# Patient Record
Sex: Female | Born: 1980 | Race: White | Hispanic: No | Marital: Married | Smoking: Never smoker
Health system: Southern US, Community
[De-identification: ages and names within clinical notes are randomized; demographics above are authoritative.]

## PROBLEM LIST (undated history)

## (undated) DIAGNOSIS — Z8719 Personal history of other diseases of the digestive system: Secondary | ICD-10-CM

## (undated) DIAGNOSIS — Z8619 Personal history of other infectious and parasitic diseases: Secondary | ICD-10-CM

## (undated) HISTORY — DX: Personal history of other infectious and parasitic diseases: Z86.19

## (undated) HISTORY — DX: Personal history of other diseases of the digestive system: Z87.19

---

## 2005-01-25 ENCOUNTER — Ambulatory Visit: Payer: Self-pay | Admitting: Family Medicine

## 2005-01-25 DIAGNOSIS — G4733 Obstructive sleep apnea (adult) (pediatric): Secondary | ICD-10-CM | POA: Insufficient documentation

## 2005-01-25 HISTORY — PX: OTHER SURGICAL HISTORY: SHX169

## 2009-03-24 ENCOUNTER — Emergency Department: Payer: Self-pay | Admitting: Emergency Medicine

## 2009-10-06 ENCOUNTER — Emergency Department: Payer: Self-pay | Admitting: Emergency Medicine

## 2009-10-07 ENCOUNTER — Encounter: Admission: RE | Admit: 2009-10-07 | Discharge: 2009-10-07 | Payer: Self-pay | Admitting: Gastroenterology

## 2011-03-09 ENCOUNTER — Emergency Department (INDEPENDENT_AMBULATORY_CARE_PROVIDER_SITE_OTHER): Payer: 59

## 2011-03-09 ENCOUNTER — Emergency Department (HOSPITAL_BASED_OUTPATIENT_CLINIC_OR_DEPARTMENT_OTHER)
Admission: EM | Admit: 2011-03-09 | Discharge: 2011-03-10 | Disposition: A | Discharge status: Home / Self Care | Payer: 59 | Attending: Emergency Medicine | Admitting: Emergency Medicine

## 2011-03-09 DIAGNOSIS — R21 Rash and other nonspecific skin eruption: Secondary | ICD-10-CM | POA: Insufficient documentation

## 2011-03-09 DIAGNOSIS — R51 Headache: Secondary | ICD-10-CM | POA: Insufficient documentation

## 2011-03-09 LAB — URINALYSIS, ROUTINE W REFLEX MICROSCOPIC
Bilirubin Urine: NEGATIVE
Glucose, UA: NEGATIVE mg/dL
Hgb urine dipstick: NEGATIVE
Ketones, ur: NEGATIVE mg/dL
Nitrite: NEGATIVE
Protein, ur: NEGATIVE mg/dL
Specific Gravity, Urine: 1.017 (ref 1.005–1.030)
Urobilinogen, UA: 0.2 mg/dL (ref 0.0–1.0)
pH: 6.5 (ref 5.0–8.0)

## 2011-03-09 LAB — PREGNANCY, URINE

## 2013-05-24 LAB — BASIC METABOLIC PANEL
BUN: 15 mg/dL (ref 4–21)
Creatinine: 0.8 mg/dL (ref 0.5–1.1)
Glucose: 85 mg/dL
Potassium: 4.4 mmol/L (ref 3.4–5.3)
Sodium: 141 mmol/L (ref 137–147)

## 2013-05-24 LAB — TSH: TSH: 1.38 u[IU]/mL (ref 0.41–5.90)

## 2013-05-24 LAB — HEPATIC FUNCTION PANEL
ALT: 36 U/L — AB (ref 7–35)
AST: 29 U/L (ref 13–35)

## 2013-06-20 ENCOUNTER — Ambulatory Visit: Payer: Self-pay | Admitting: Family Medicine

## 2014-08-26 IMAGING — CR DG KNEE COMPLETE 4+V*L*
1 series · 4 of 4 positions shown · non-contrast
Comparison: none

REASON FOR EXAM: pain
COMMENTS:

PROCEDURE:     KDR - KDXR KNEE LT COMP WITH OBLIQUES  - June 20, 2013  [DATE]
RESULT:     Comparison:  None

[Series 1: ap · 0.17mm/px · 4 of 4 slices shown]
[im 1/4]
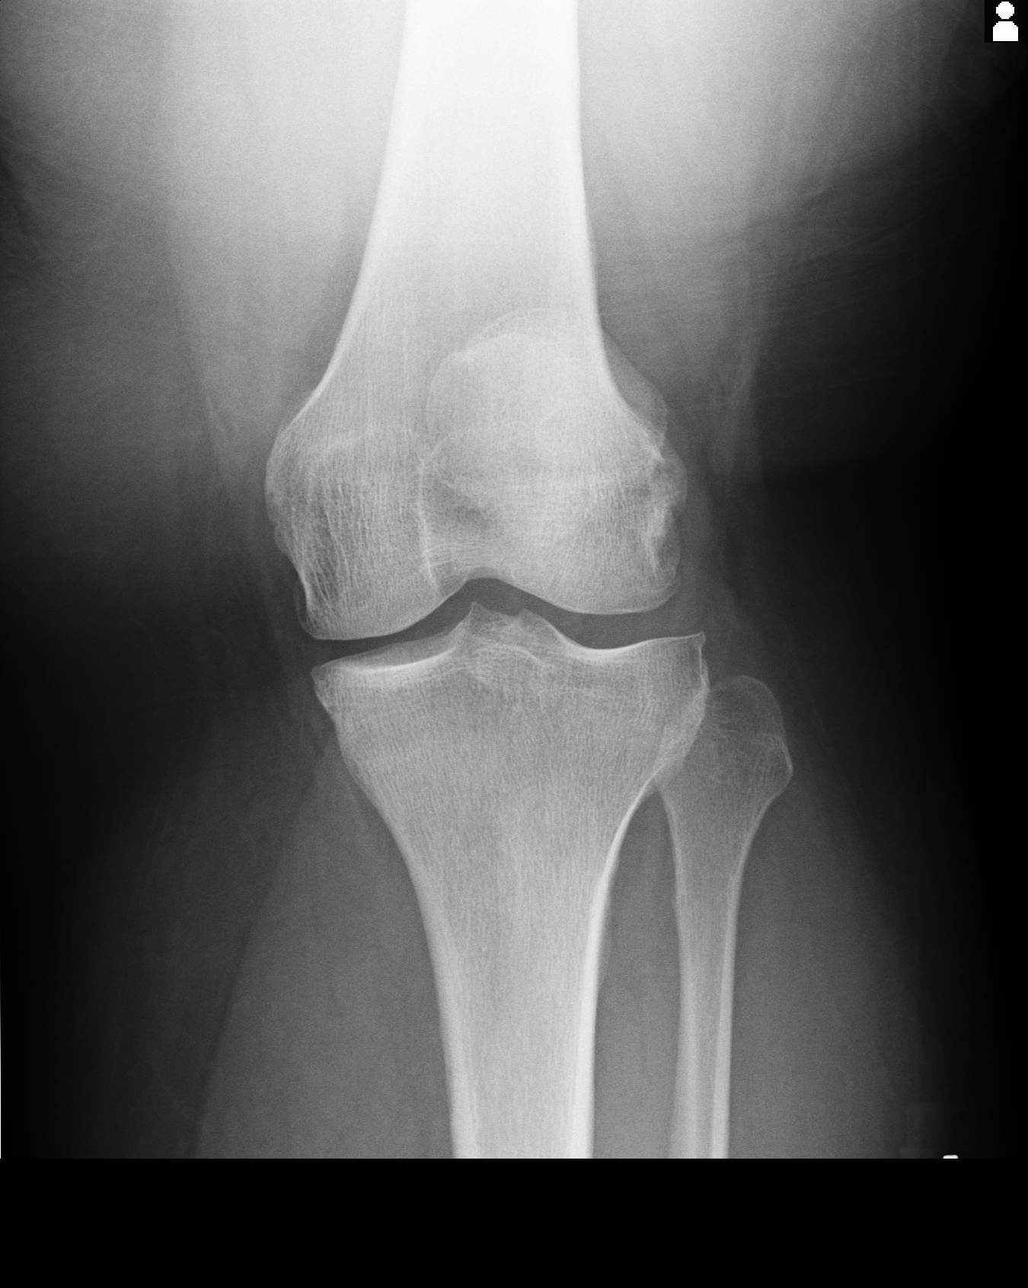
[im 2/4]
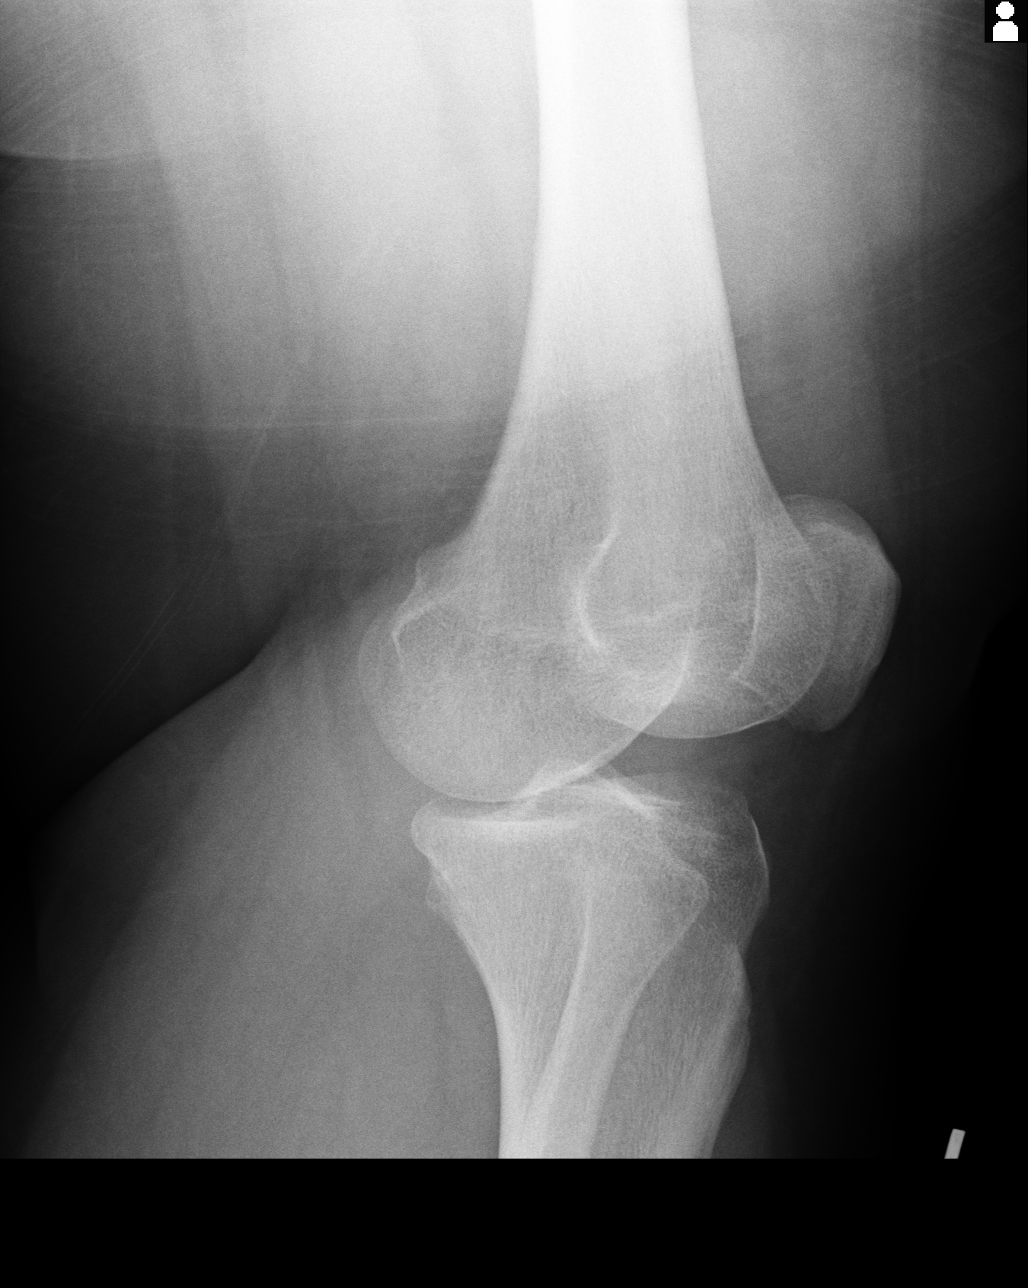
[im 3/4]
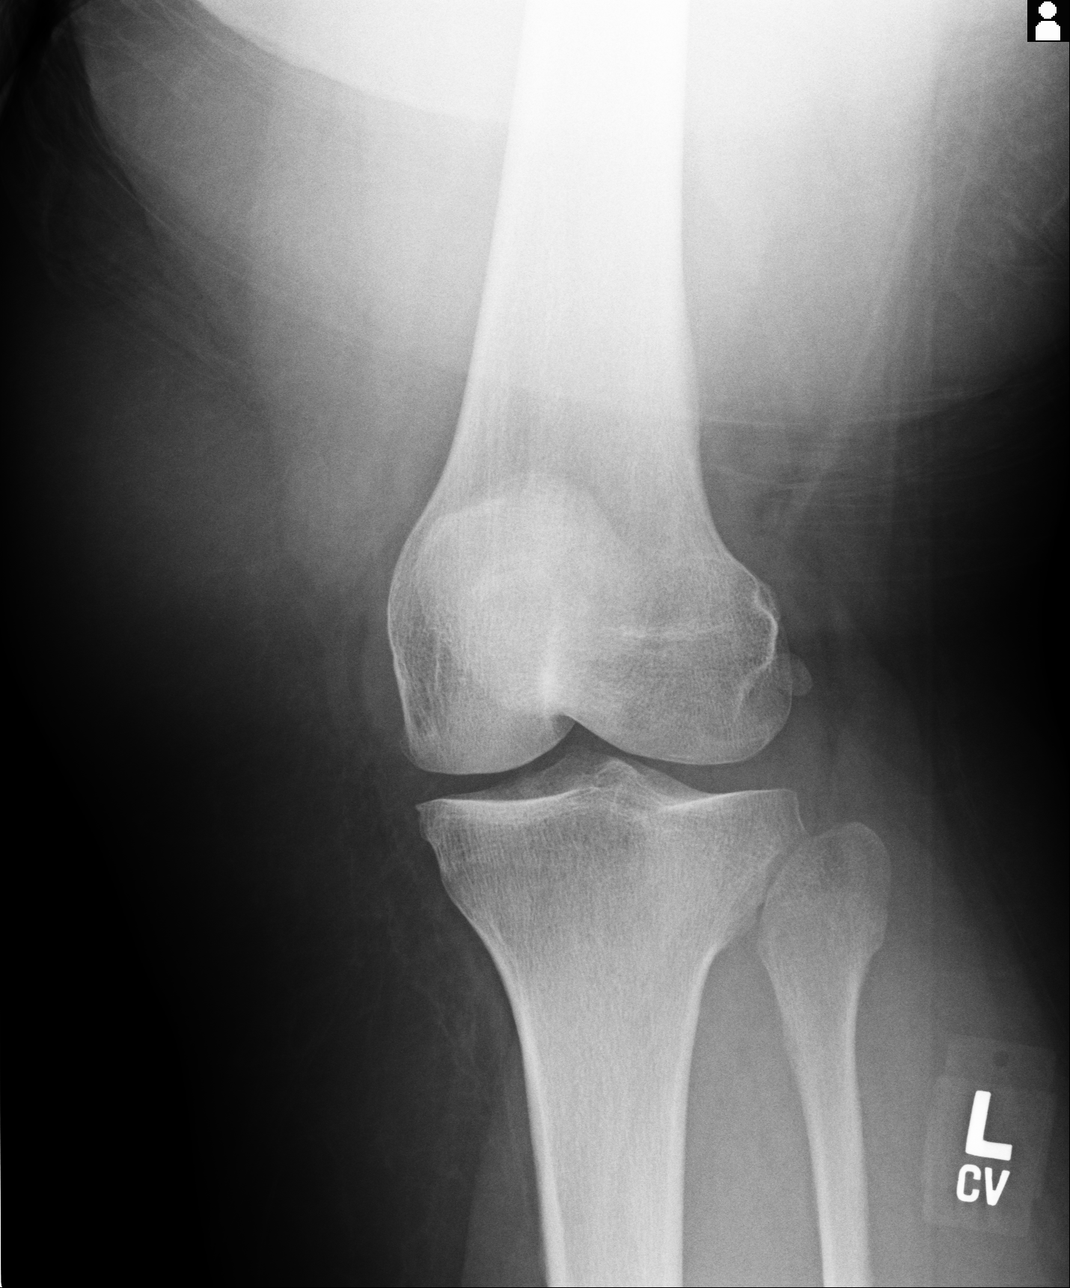
[im 4/4]
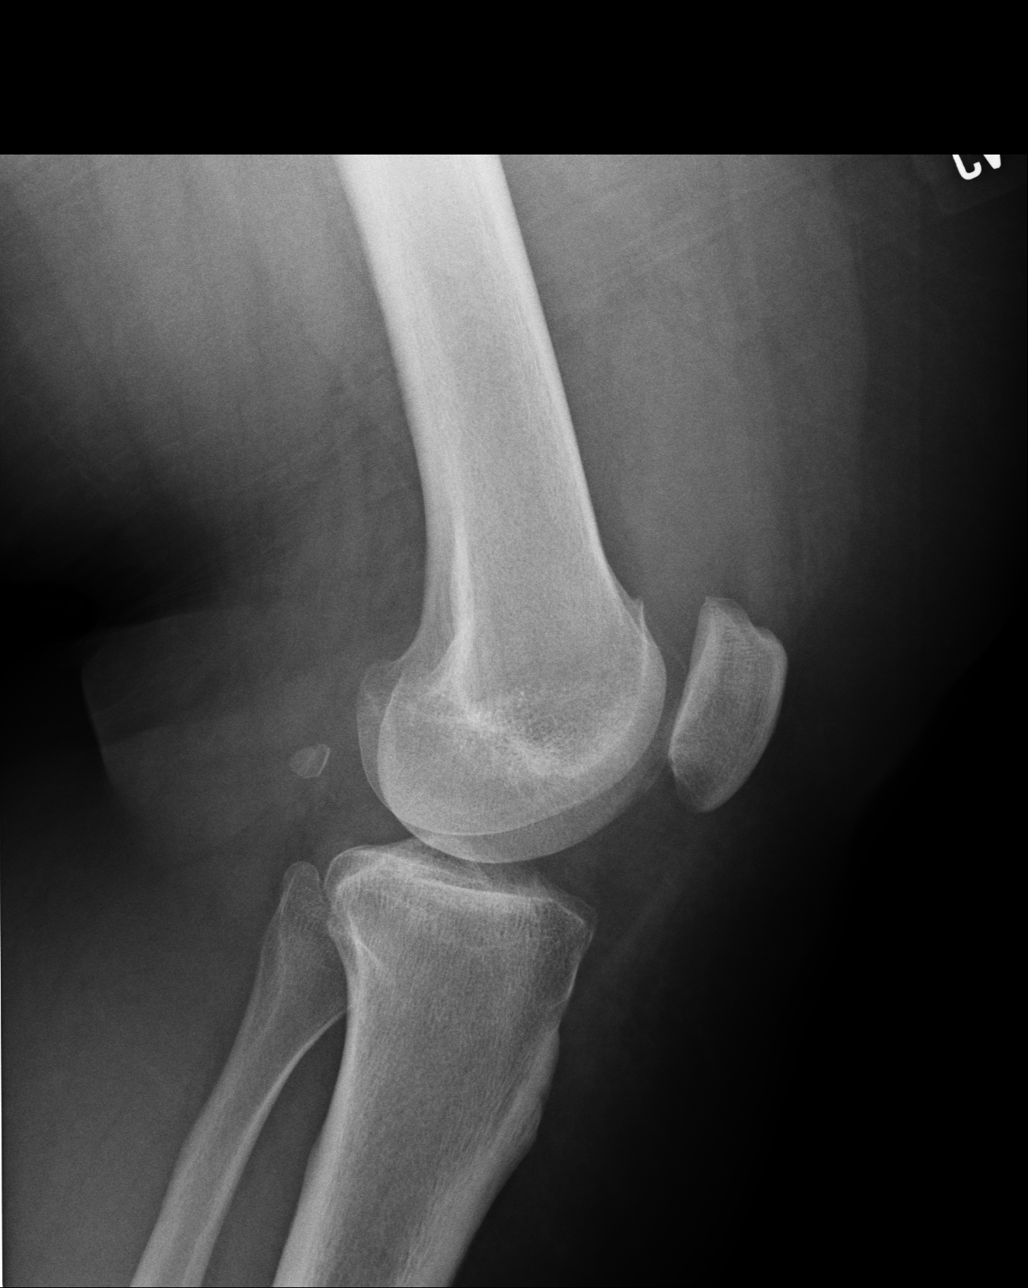

[4 of 4 positions shown; findings below may reference images not displayed]

FINDINGS: 4 views of the left knee demonstrates no acute fracture or dislocation.
There is a large joint effusion. There are mild degenerative changes of the
medial tibiofemoral compartment.
IMPRESSION: No acute osseous injury of the left knee.

[REDACTED]

## 2015-02-08 ENCOUNTER — Emergency Department
Admit: 2015-02-08 | Disposition: A | Discharge status: Home / Self Care | Payer: Self-pay | Admitting: Emergency Medicine

## 2015-02-08 LAB — COMPREHENSIVE METABOLIC PANEL
Albumin: 3.5 g/dL
Alkaline Phosphatase: 75 U/L
Anion Gap: 6 — ABNORMAL LOW (ref 7–16)
BUN: 14 mg/dL
Bilirubin,Total: 0.1 mg/dL — ABNORMAL LOW
Calcium, Total: 8.5 mg/dL — ABNORMAL LOW
Chloride: 107 mmol/L
Co2: 25 mmol/L
Creatinine: 0.9 mg/dL
EGFR (African American): >60
EGFR (Non-African Amer.): >60
Glucose: 153 mg/dL — ABNORMAL HIGH
Potassium: 3.6 mmol/L
SGOT(AST): 25 U/L
SGPT (ALT): 27 U/L
Sodium: 138 mmol/L
Total Protein: 7.5 g/dL

## 2015-02-08 LAB — CBC WITH DIFFERENTIAL/PLATELET
Basophil #: 0.1 10*3/uL (ref 0.0–0.1)
Basophil %: 0.7 %
Eosinophil #: 0.5 10*3/uL (ref 0.0–0.7)
Eosinophil %: 4.6 %
HCT: 36.6 % (ref 35.0–47.0)
HGB: 12 g/dL (ref 12.0–16.0)
Lymphocyte #: 1.3 10*3/uL (ref 1.0–3.6)
Lymphocyte %: 12.7 %
MCH: 26.3 pg (ref 26.0–34.0)
MCHC: 32.8 g/dL (ref 32.0–36.0)
MCV: 80 fL (ref 80–100)
Monocyte #: 0.7 x10 3/mm (ref 0.2–0.9)
Monocyte %: 7.1 %
Neutrophil #: 7.5 10*3/uL — ABNORMAL HIGH (ref 1.4–6.5)
Neutrophil %: 74.9 %
Platelet: 357 10*3/uL (ref 150–440)
RBC: 4.55 10*6/uL (ref 3.80–5.20)
RDW: 14.8 % — ABNORMAL HIGH (ref 11.5–14.5)
WBC: 10 10*3/uL (ref 3.6–11.0)

## 2015-02-08 LAB — LIPASE, BLOOD: Lipase: 24 U/L

## 2015-02-08 LAB — TROPONIN I: Troponin-I: <0.03 ng/mL

## 2015-02-09 LAB — URINALYSIS, COMPLETE
Bacteria: NONE SEEN
Bilirubin,UR: NEGATIVE
Blood: NEGATIVE
Glucose,UR: NEGATIVE mg/dL (ref 0–75)
Ketone: NEGATIVE
Leukocyte Esterase: NEGATIVE
Nitrite: NEGATIVE
Ph: 6 (ref 4.5–8.0)
Protein: NEGATIVE
Specific Gravity: 1.019 (ref 1.003–1.030)

## 2015-07-24 DIAGNOSIS — M25561 Pain in right knee: Secondary | ICD-10-CM | POA: Insufficient documentation

## 2015-07-24 DIAGNOSIS — G43909 Migraine, unspecified, not intractable, without status migrainosus: Secondary | ICD-10-CM | POA: Insufficient documentation

## 2015-07-24 DIAGNOSIS — Z8719 Personal history of other diseases of the digestive system: Secondary | ICD-10-CM | POA: Insufficient documentation

## 2015-07-24 DIAGNOSIS — M25562 Pain in left knee: Secondary | ICD-10-CM

## 2015-07-24 DIAGNOSIS — K219 Gastro-esophageal reflux disease without esophagitis: Secondary | ICD-10-CM | POA: Insufficient documentation

## 2015-07-24 DIAGNOSIS — J309 Allergic rhinitis, unspecified: Secondary | ICD-10-CM | POA: Insufficient documentation

## 2015-07-28 ENCOUNTER — Ambulatory Visit (INDEPENDENT_AMBULATORY_CARE_PROVIDER_SITE_OTHER): Payer: PRIVATE HEALTH INSURANCE | Admitting: Family Medicine

## 2015-07-28 ENCOUNTER — Encounter: Payer: Self-pay | Admitting: Family Medicine

## 2015-07-28 VITALS — BP 134/82 | HR 74 | Temp 98.3°F | Resp 16 | Ht 66.0 in | Wt >= 6400 oz

## 2015-07-28 DIAGNOSIS — S81812A Laceration without foreign body, left lower leg, initial encounter: Secondary | ICD-10-CM | POA: Diagnosis not present

## 2015-07-28 DIAGNOSIS — R601 Generalized edema: Secondary | ICD-10-CM | POA: Diagnosis not present

## 2015-07-28 NOTE — Progress Notes (Signed)
       Patient: Jessica Fields Female    DOB: [birth year]   34 y.o.   MRN: [id number] Visit Date: 07/28/2015  Today's Provider: Mila Merry, MD   Chief Complaint  Patient presents with  . Wound Check   Subjective:    HPI  Patient is here today to have staples removed. Patient states she fell 9 days ago  in a pool causing a laceration to her lower left leg. Patient was seen and treated at [facility] ER in [place]. Patient states they put staples in her leg and advised her to have them removed in 7-10 days. Patient reports the wound has healed well. Patient denies any drainage, redness at the site of the wound.  She states she was given tetanus vaccine at ER.  She is also concerned that both legs have  Been increasingly swollen the last few weeks, particularly the last week. She has actually been off of her feet more than usual, although not always elevating them.Denies any change in sodium or salt consumption. No DOE or orthopnea. No leg injuries or pains.     No Known Allergies Previous Medications   FLUTICASONE (FLONASE) 50 MCG/ACT NASAL SPRAY    Place 2 sprays into both nostrils daily as needed.   OXYCODONE-ACETAMINOPHEN (PERCOCET) 5-325 MG TABLET    Take 1 tablet by mouth every 8 (eight) hours as needed.     Review of Systems  Constitutional: Negative for fever, chills, appetite change and fatigue.  Respiratory: Negative for chest tightness and shortness of breath.   Cardiovascular: Negative for chest pain and palpitations.  Gastrointestinal: Negative for nausea, vomiting and abdominal pain.  Skin: Positive for wound.  Neurological: Negative for dizziness and weakness.    Social History  Substance Use Topics  . Smoking status: Never Smoker   . Smokeless tobacco: Not on file  . Alcohol Use: 0.0 oz/week    0 Standard drinks or equivalent per week     Comment: occasional use   Objective:   BP 134/82 mmHg  Pulse 74  Temp(Src) 98.3 F (36.8 C) (Oral)  Resp 16   Ht  (1.676 m)  Wt 438 lb (198.675 kg)  BMI 70.73 kg/m2  SpO2 98%  Physical Exam   General appearance: alert, well developed, well nourished, cooperative and in no distress Head: Normocephalic, without obvious abnormality, atraumatic Lungs: Respirations even and unlabored, CTAB Extremities: 3+ bilateral LE edema. No erythema.  Skin: Skin color, texture, turgor normal. No rashes seen  Psych: Appropriate mood and affect. Neurologic: Mental status: Alert, oriented to person, place, and time, thought content appropriate. Skin: Approximately 7cm stapled, scabbed laceration anterior left lower leg. Well approximated with no surrounding erythema.     Assessment & Plan:     1. Leg laceration, left, initial encounter Healing well. Removed all staples. No drainage and no sign of infection.   2. Generalized edema  - TSH - Comprehensive metabolic panel - CBC        Mila Merry, MD  [facility]

## 2015-07-29 ENCOUNTER — Telehealth: Payer: Self-pay

## 2015-07-29 LAB — COMPREHENSIVE METABOLIC PANEL
ALT: 27 IU/L (ref 0–32)
AST: 15 IU/L (ref 0–40)
Albumin/Globulin Ratio: 1.2 (ref 1.1–2.5)
Albumin: 3.7 g/dL (ref 3.5–5.5)
Alkaline Phosphatase: 91 IU/L (ref 39–117)
BUN/Creatinine Ratio: 15 (ref 8–20)
BUN: 10 mg/dL (ref 6–20)
Bilirubin Total: <0.2 mg/dL (ref 0.0–1.2)
CO2: 24 mmol/L (ref 18–29)
Calcium: 8.5 mg/dL — ABNORMAL LOW (ref 8.7–10.2)
Chloride: 102 mmol/L (ref 97–108)
Creatinine, Ser: 0.67 mg/dL (ref 0.57–1.00)
GFR calc Af Amer: 131 mL/min/{1.73_m2} (ref >59)
GFR calc non Af Amer: 113 mL/min/{1.73_m2} (ref >59)
Globulin, Total: 3 g/dL (ref 1.5–4.5)
Glucose: 92 mg/dL (ref 65–99)
Potassium: 4.2 mmol/L (ref 3.5–5.2)
Sodium: 141 mmol/L (ref 134–144)
Total Protein: 6.7 g/dL (ref 6.0–8.5)

## 2015-07-29 LAB — CBC
Hematocrit: 36.5 % (ref 34.0–46.6)
Hemoglobin: 11.7 g/dL (ref 11.1–15.9)
MCH: 26.3 pg — ABNORMAL LOW (ref 26.6–33.0)
MCHC: 32.1 g/dL (ref 31.5–35.7)
MCV: 82 fL (ref 79–97)
Platelets: 353 10*3/uL (ref 150–379)
RBC: 4.45 x10E6/uL (ref 3.77–5.28)
RDW: 14.7 % (ref 12.3–15.4)
WBC: 10.1 10*3/uL (ref 3.4–10.8)

## 2015-07-29 LAB — TSH: TSH: 2.61 u[IU]/mL (ref 0.450–4.500)

## 2015-07-29 NOTE — Telephone Encounter (Signed)
-----   Message from Malva Limes, MD sent at 07/29/2015  8:00 AM EDT ----- Labs are all normal. Swelling may be related to leg injury. Try to walk more, avoid sodium in diet, and keep legs elevated when not walking.

## 2015-07-29 NOTE — Telephone Encounter (Signed)
Advised patient as below.  

## 2015-07-29 NOTE — Telephone Encounter (Signed)
Left message to call back  

## 2015-07-30 ENCOUNTER — Encounter: Payer: Self-pay | Admitting: Family Medicine

## 2015-08-11 ENCOUNTER — Telehealth: Payer: Self-pay | Admitting: Family Medicine

## 2015-08-11 MED ORDER — CEPHALEXIN 500 MG PO CAPS: 500.0000 mg | ORAL_CAPSULE | Freq: Four times a day (QID) | ORAL | Status: DC

## 2015-08-11 NOTE — Telephone Encounter (Signed)
Patient advised.

## 2015-08-11 NOTE — Telephone Encounter (Signed)
Pt states Dr Sherrie MustacheFisher removes staples from her leg 2 weeks ago and she is still having brown/orange and blood discharge from her leg.  Pt is asking if this is normal?  CB#[phone]/MW

## 2015-08-11 NOTE — Telephone Encounter (Signed)
Called patient back for more info. Patient stated that there has been no fever and no heat at the wound area. There is a rash where the bandage had been. Patient said that there is a little discoloration right at edges of wound. Still having brown/ orange and blood discharge. Please advise.

## 2015-08-12 NOTE — Telephone Encounter (Signed)
Was advised by CMA to apply neosporin twice a day and sent in rx for cephalexin 500mg  QID. She is to call if not steadily improving and if not resolved before finishing antibiotic.

## 2016-03-17 ENCOUNTER — Ambulatory Visit (INDEPENDENT_AMBULATORY_CARE_PROVIDER_SITE_OTHER): Payer: No Typology Code available for payment source | Admitting: Family Medicine

## 2016-03-17 ENCOUNTER — Encounter: Payer: Self-pay | Admitting: Family Medicine

## 2016-03-17 VITALS — BP 152/90 | HR 111 | Temp 98.8°F | Resp 20 | Wt >= 6400 oz

## 2016-03-17 DIAGNOSIS — J069 Acute upper respiratory infection, unspecified: Secondary | ICD-10-CM

## 2016-03-17 DIAGNOSIS — R059 Cough, unspecified: Secondary | ICD-10-CM

## 2016-03-17 DIAGNOSIS — R05 Cough: Secondary | ICD-10-CM | POA: Diagnosis not present

## 2016-03-17 MED ORDER — HYDROCODONE-HOMATROPINE 5-1.5 MG/5ML PO SYRP: 5.0000 mL | ORAL_SOLUTION | Freq: Three times a day (TID) | ORAL | Status: DC | PRN

## 2016-03-17 NOTE — Patient Instructions (Signed)
Upper Respiratory Infection, Adult Most upper respiratory infections (URIs) are a viral infection of the air passages leading to the lungs. A URI affects the nose, throat, and upper air passages. The most common type of URI is nasopharyngitis and is typically referred to as "the common cold." URIs run their course and usually go away on their own. Most of the time, a URI does not require medical attention, but sometimes a bacterial infection in the upper airways can follow a viral infection. This is called a secondary infection. Sinus and middle ear infections are common types of secondary upper respiratory infections. Bacterial pneumonia can also complicate a URI. A URI can worsen asthma and chronic obstructive pulmonary disease (COPD). Sometimes, these complications can require emergency medical care and may be life threatening.  CAUSES Almost all URIs are caused by viruses. A virus is a type of germ and can spread from one person to another.  RISKS FACTORS You may be at risk for a URI if:   You smoke.   You have chronic heart or lung disease.  You have a weakened defense (immune) system.   You are very young or very old.   You have nasal allergies or asthma.  You work in crowded or poorly ventilated areas.  You work in health care facilities or schools. SIGNS AND SYMPTOMS  Symptoms typically develop 2-3 days after you come in contact with a cold virus. Most viral URIs last 7-10 days. However, viral URIs from the influenza virus (flu virus) can last 14-18 days and are typically more severe. Symptoms may include:   Runny or stuffy (congested) nose.   Sneezing.   Cough.   Sore throat.   Headache.   Fatigue.   Fever.   Loss of appetite.   Pain in your forehead, behind your eyes, and over your cheekbones (sinus pain).  Muscle aches.  DIAGNOSIS  Your health care provider may diagnose a URI by:  Physical exam.  Tests to check that your symptoms are not due to  another condition such as:  Strep throat.  Sinusitis.  Pneumonia.  Asthma. TREATMENT  A URI goes away on its own with time. It cannot be cured with medicines, but medicines may be prescribed or recommended to relieve symptoms. Medicines may help:  Reduce your fever.  Reduce your cough.  Relieve nasal congestion. HOME CARE INSTRUCTIONS   Take medicines only as directed by your health care provider.   Gargle warm saltwater or take cough drops to comfort your throat as directed by your health care provider.  Use a warm mist humidifier or inhale steam from a shower to increase air moisture. This may make it easier to breathe.  Drink enough fluid to keep your urine clear or pale yellow.   Eat soups and other clear broths and maintain good nutrition.   Rest as needed.   Return to work when your temperature has returned to normal or as your health care provider advises. You may need to stay home longer to avoid infecting others. You can also use a face mask and careful hand washing to prevent spread of the virus.  Increase the usage of your inhaler if you have asthma.   Do not use any tobacco products, including cigarettes, chewing tobacco, or electronic cigarettes. If you need help quitting, ask your health care provider. PREVENTION  The best way to protect yourself from getting a cold is to practice good hygiene.   Avoid oral or hand contact with people with cold   symptoms.   Wash your hands often if contact occurs.  There is no clear evidence that vitamin C, vitamin E, echinacea, or exercise reduces the chance of developing a cold. However, it is always recommended to get plenty of rest, exercise, and practice good nutrition.  SEEK MEDICAL CARE IF:   You are getting worse rather than better.   Your symptoms are not controlled by medicine.   You have chills.  You have worsening shortness of breath.  You have brown or red mucus.  You have yellow or brown nasal  discharge.  You have pain in your face, especially when you bend forward.  You have a fever.  You have swollen neck glands.  You have pain while swallowing.  You have white areas in the back of your throat. SEEK IMMEDIATE MEDICAL CARE IF:   You have severe or persistent:  Headache.  Ear pain.  Sinus pain.  Chest pain.  You have chronic lung disease and any of the following:  Wheezing.  Prolonged cough.  Coughing up blood.  A change in your usual mucus.  You have a stiff neck.  You have changes in your:  Vision.  Hearing.  Thinking.  Mood. MAKE SURE YOU:   Understand these instructions.  Will watch your condition.  Will get help right away if you are not doing well or get worse.   This information is not intended to replace advice given to you by your health care provider. Make sure you discuss any questions you have with your health care provider.   Document Released: 03/30/2001 Document Revised: 02/18/2015 Document Reviewed: 01/09/2014 Elsevier Interactive Patient Education 2016 [employer] Inc.  

## 2016-03-17 NOTE — Progress Notes (Signed)
Patient: Jessica Fields Female    DOB: 03/14/1981   35 y.o.   MRN: [id number] Visit Date: 03/17/2016  Today's Provider: Mila Merryonald Fisher, MD   Chief Complaint  Patient presents with  . Cough   Subjective:    Cough This is a new problem. The current episode started yesterday. The problem has been gradually worsening. Associated symptoms include chest pain (when coughing), chills, a fever (low grade 99.8), headaches, myalgias, nasal congestion, postnasal drip, rhinorrhea, a sore throat, sweats (night sweats) and wheezing. Pertinent negatives include no ear congestion, ear pain, eye redness, heartburn, hemoptysis, rash or shortness of breath. Nothing aggravates the symptoms. Treatments tried: Delsym and NyQuil. The treatment provided mild relief.       No Known Allergies Previous Medications   FLUTICASONE (FLONASE) 50 MCG/ACT NASAL SPRAY    Place 2 sprays into both nostrils daily as needed.    Review of Systems  Constitutional: Positive for fever (low grade 99.8), chills, diaphoresis (night sweats) and fatigue. Negative for appetite change.  HENT: Positive for congestion, postnasal drip, rhinorrhea, sneezing, sore throat, trouble swallowing and voice change. Negative for ear discharge, ear pain, mouth sores, nosebleeds, sinus pressure and tinnitus.   Eyes: Positive for discharge (watery eyes). Negative for pain, redness and itching.  Respiratory: Positive for cough and wheezing. Negative for hemoptysis, chest tightness and shortness of breath.   Cardiovascular: Positive for chest pain (when coughing). Negative for palpitations.  Gastrointestinal: Negative for heartburn, nausea, vomiting and abdominal pain.  Musculoskeletal: Positive for myalgias.  Skin: Negative for rash.  Neurological: Positive for headaches. Negative for dizziness, weakness and light-headedness.    Social History  Substance Use Topics  . Smoking status: Never Smoker   . Smokeless tobacco: Not on file  .  Alcohol Use: 0.0 oz/week    0 Standard drinks or equivalent per week     Comment: occasional use   Objective:   BP 152/90 mmHg  Pulse 111  Temp(Src) 98.8 F (37.1 C) (Oral)  Resp 20  Wt 431 lb (195.5 kg)  SpO2 97%  Physical Exam  General Appearance:    Alert, cooperative, no distress  HENT:   ENT exam normal, no neck nodes or sinus tenderness, bilateral TM normal without fluid or infection, neck without nodes, pharynx erythematous without exudate, sinuses nontender and nasal mucosa pale and congested  Eyes:    PERRL, conjunctiva/corneas clear, EOM's intact       Lungs:     Clear to auscultation bilaterally, respirations unlabored  Heart:    Regular rate and rhythm  Neurologic:   Awake, alert, oriented x 3. No apparent focal neurological           defect.            Assessment & Plan:     1. Upper respiratory infection Counseled regarding signs and symptoms of viral and bacterial respiratory infections. Advised to call or return for additional evaluation if he develops any sign of bacterial infection, or if current symptoms last longer than 10 days.    2. Cough  - HYDROcodone-homatropine (HYCODAN) 5-1.5 MG/5ML syrup; Take 5 mLs by mouth every 8 (eight) hours as needed for cough.  Dispense: 120 mL; Refill: 0     The entirety of the information documented in the History of Present Illness, Review of Systems and Physical Exam were personally obtained by me. Portions of this information were initially documented by Awilda Billoshena Dorotha Hirschi, CMA and reviewed by me for thoroughness  and accuracy.    Lelon Huh, MD  [facility]

## 2016-04-16 IMAGING — CR DG ABDOMEN 3V
1 series · 4 of 4 positions shown · non-contrast
Comparison: None.

CLINICAL DATA: Upper abdominal pain beginning yesterday.

EXAM:
ABDOMEN SERIES

[Series 1: dxr abdomen 3-way (incl pa cxr) · 0.14mm/px · 4 of 4 slices shown]
[im 1/4]
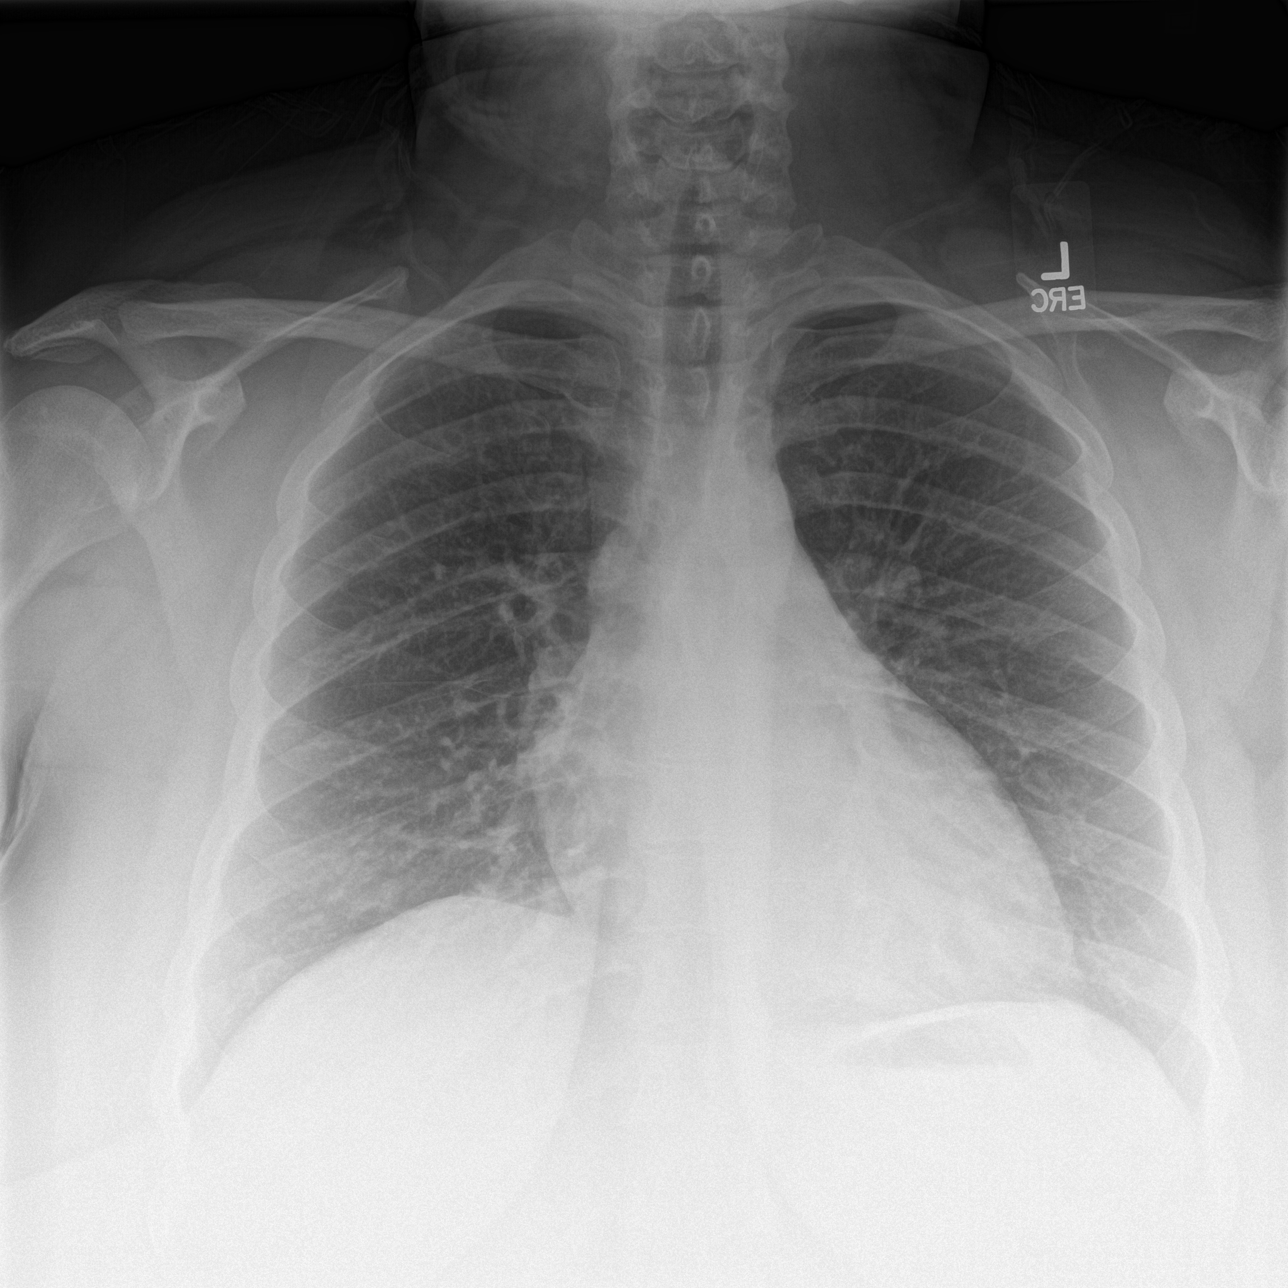
[im 2/4]
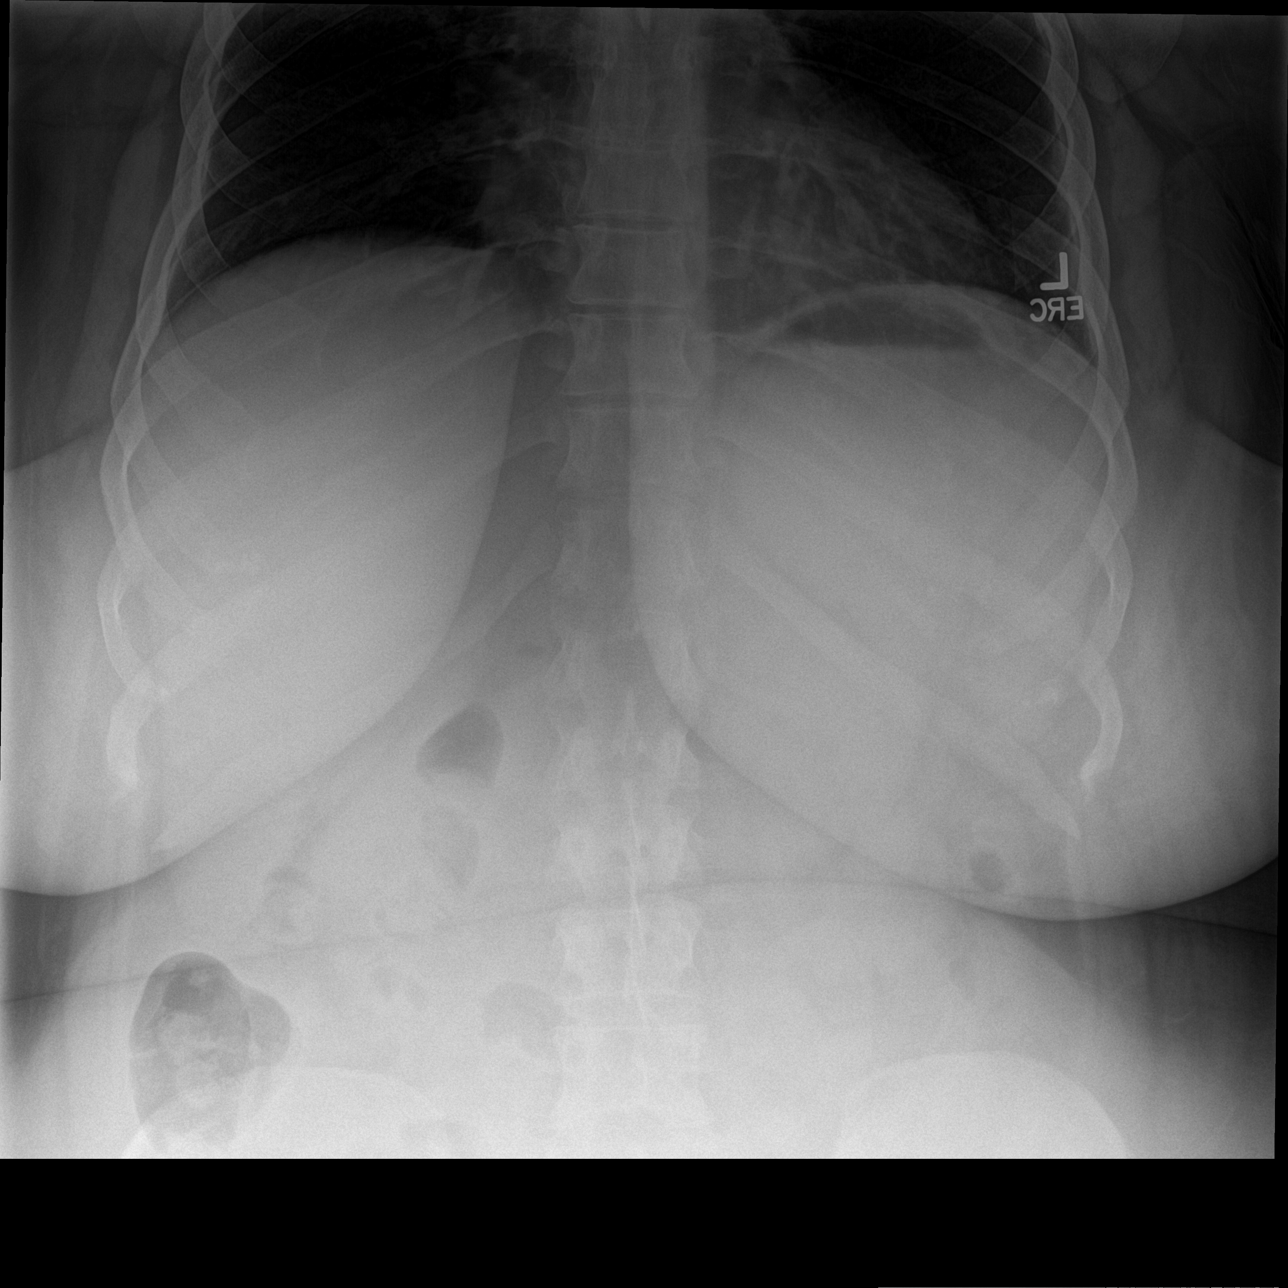
[im 3/4]
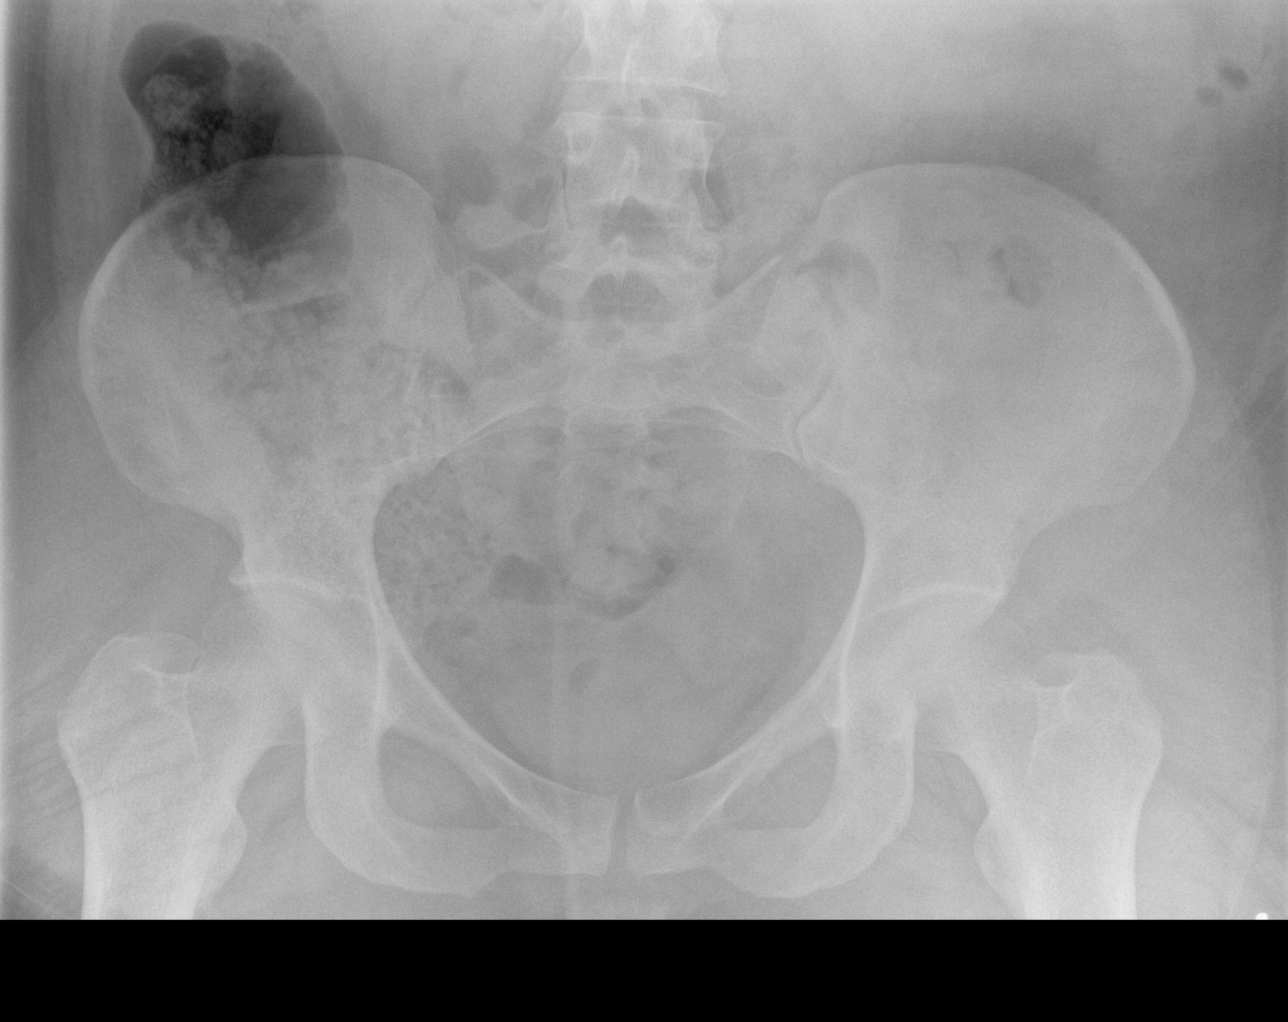
[im 4/4]
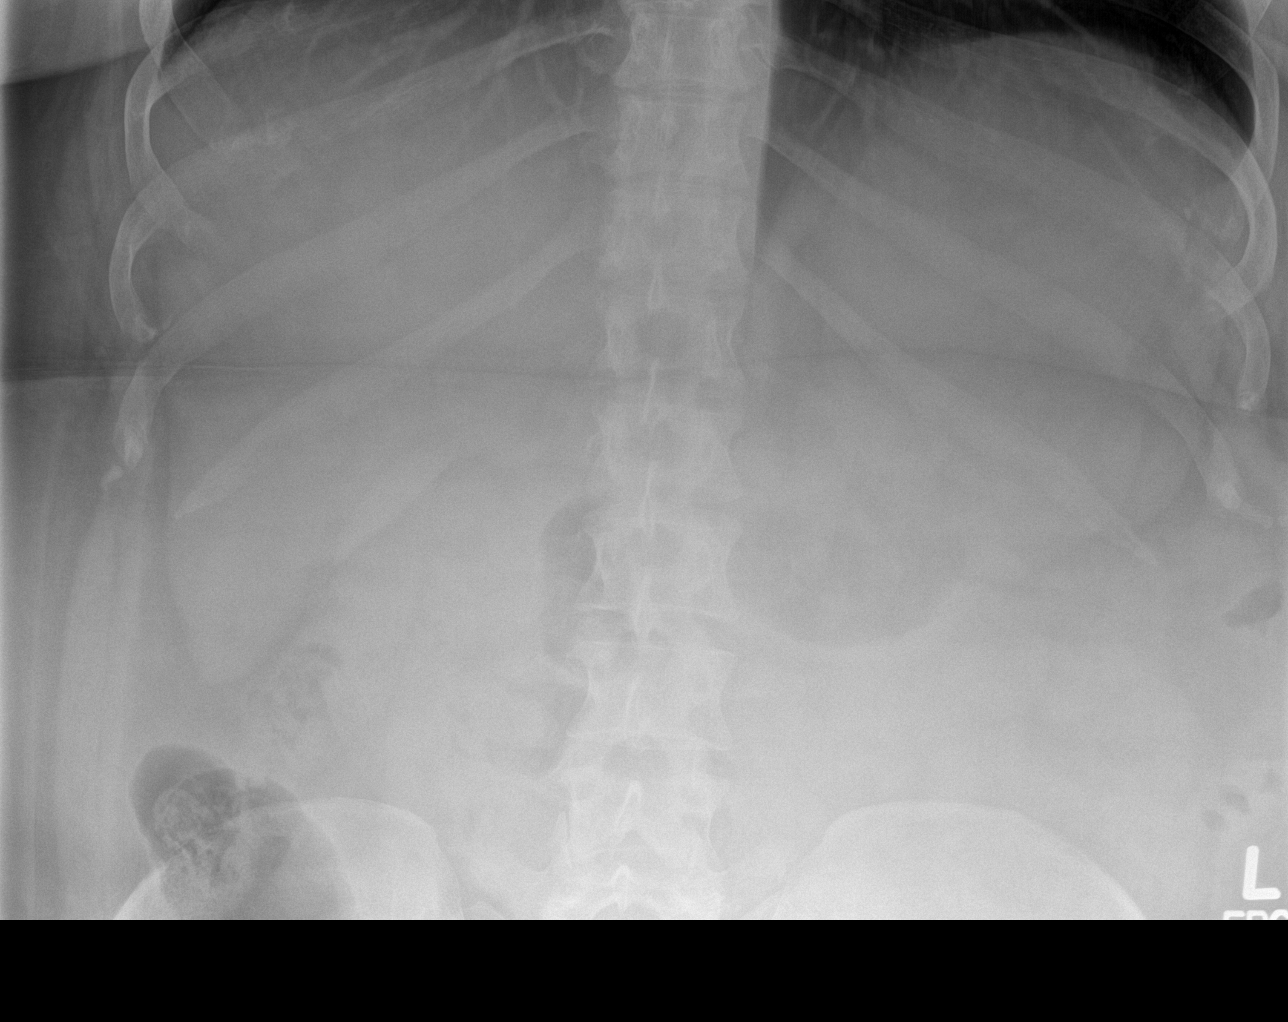

[4 of 4 positions shown; findings below may reference images not displayed]

FINDINGS: There is no evidence of dilated bowel loops or free intraperitoneal
air. No radiopaque calculi or other significant radiographic
abnormality is seen. Heart size and mediastinal contours are within
normal limits. Both lungs are clear.
IMPRESSION: Negative abdominal radiographs.  No acute cardiopulmonary disease.

## 2016-04-16 IMAGING — US ABDOMEN ULTRASOUND
1 series · 14 of 25 positions shown · non-contrast
Comparison: None.

CLINICAL DATA: Severe epigastric pain. Patient too big for CT
scanner. Weight exceeds 400 lb.

EXAM:
ULTRASOUND ABDOMEN COMPLETE

[Series 1: abdomen ultrasound · 0.33mm/px · 14 of 128 slices shown]
[im 1/128]
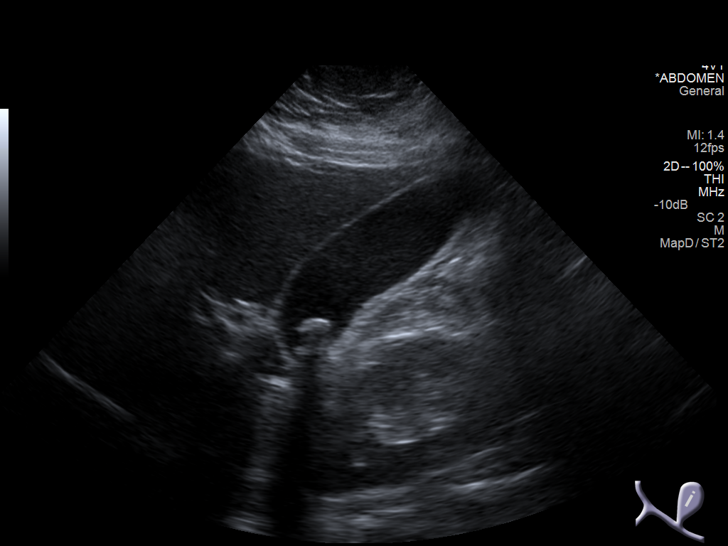
[im 11/128]
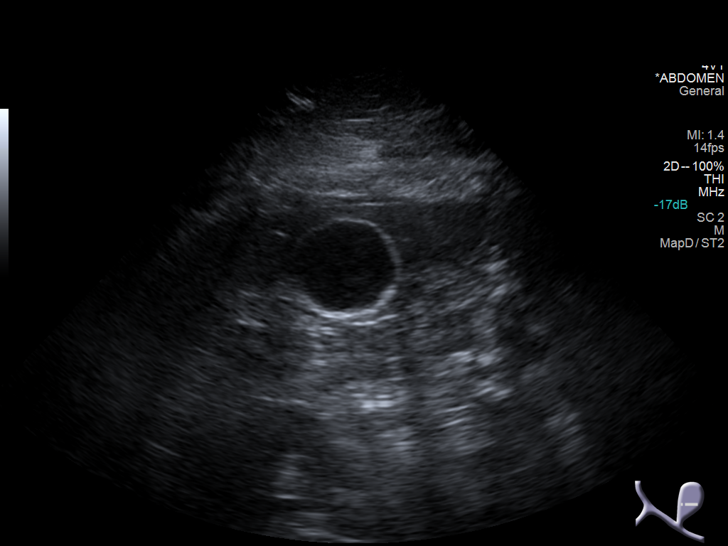
[im 22/128]
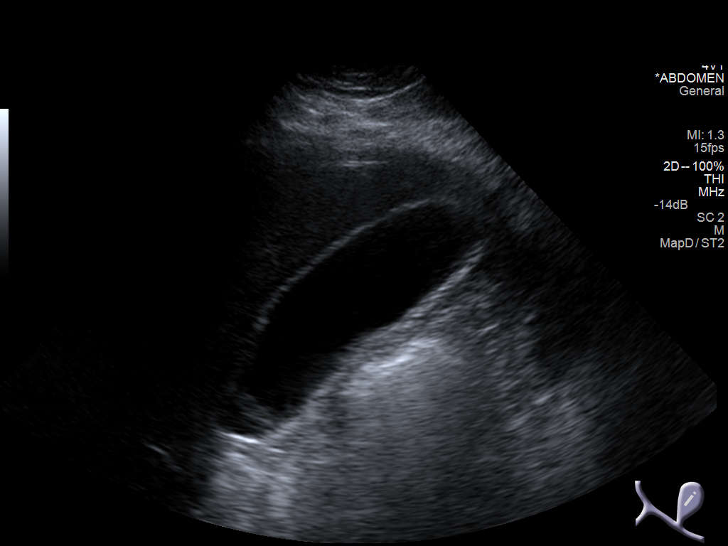
[im 32/128]
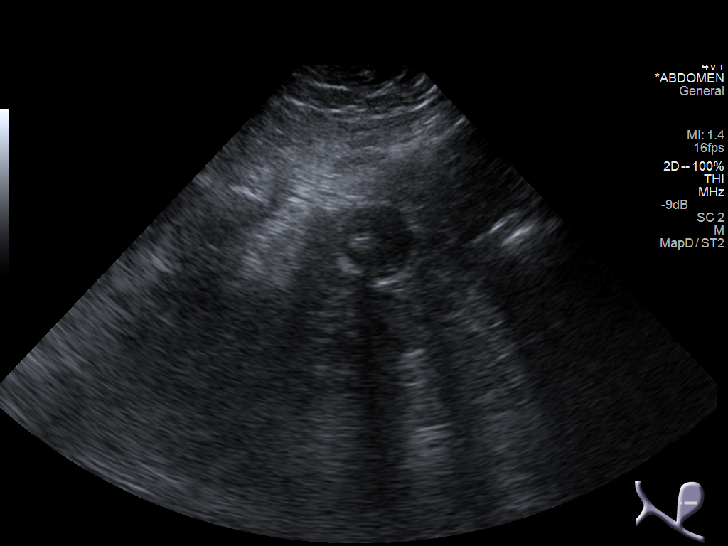
[im 43/128]
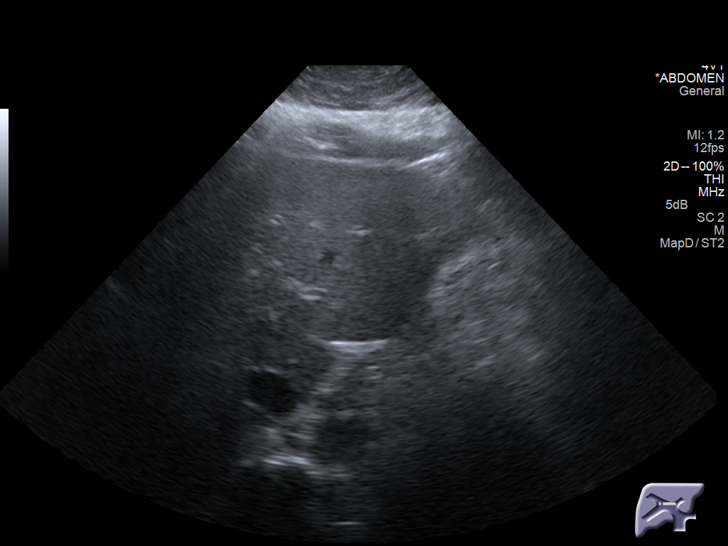
[im 48/128]
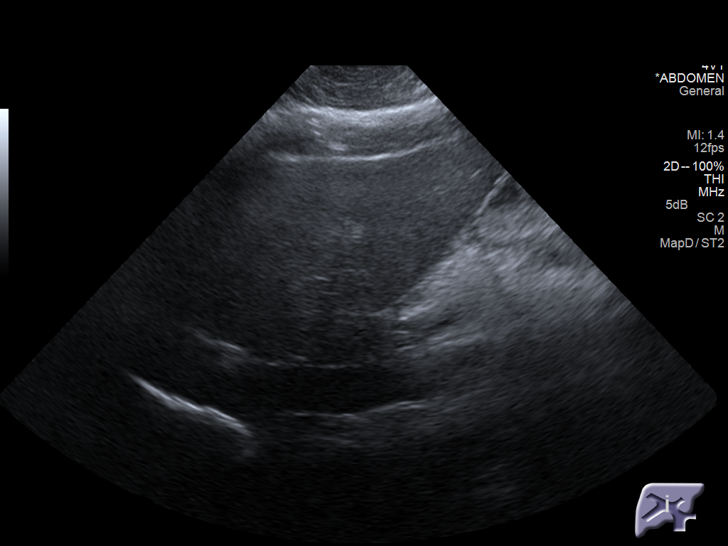
[im 59/128]
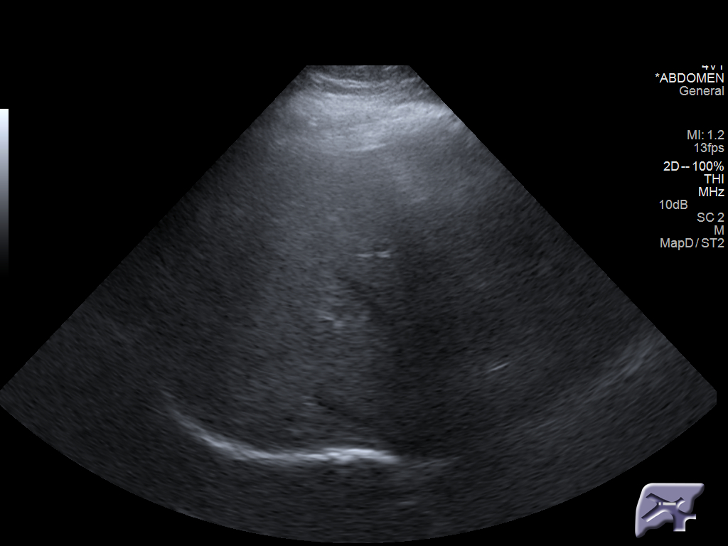
[im 69/128]
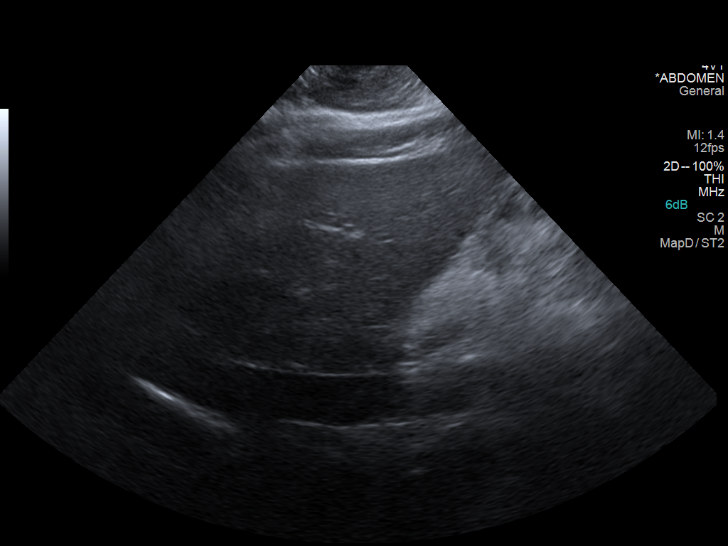
[im 80/128]
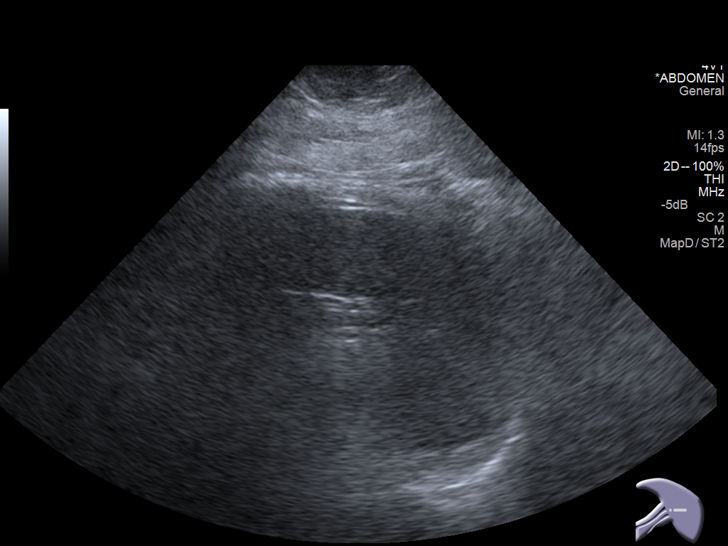
[im 85/128]
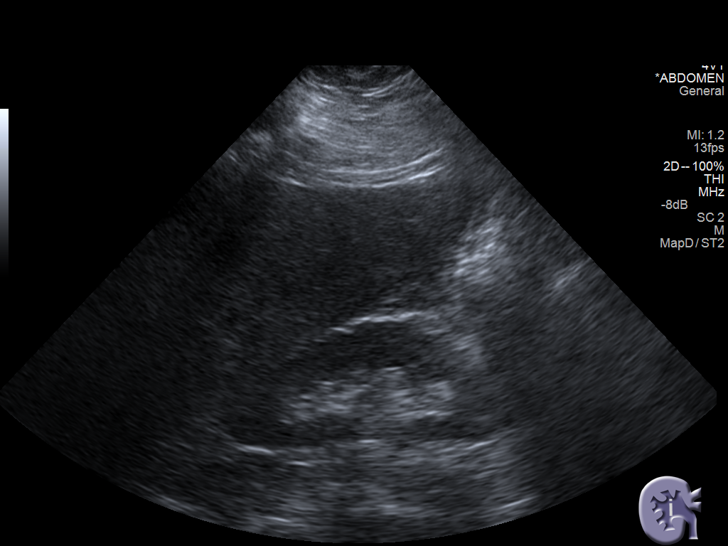
[im 96/128]
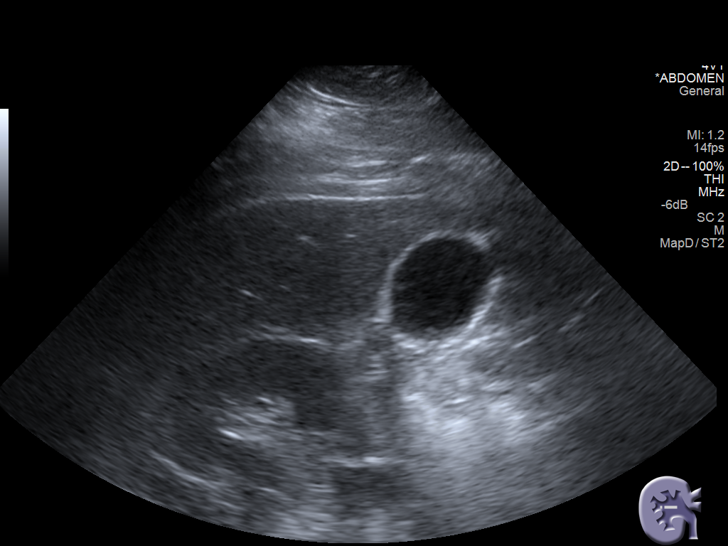
[im 106/128]
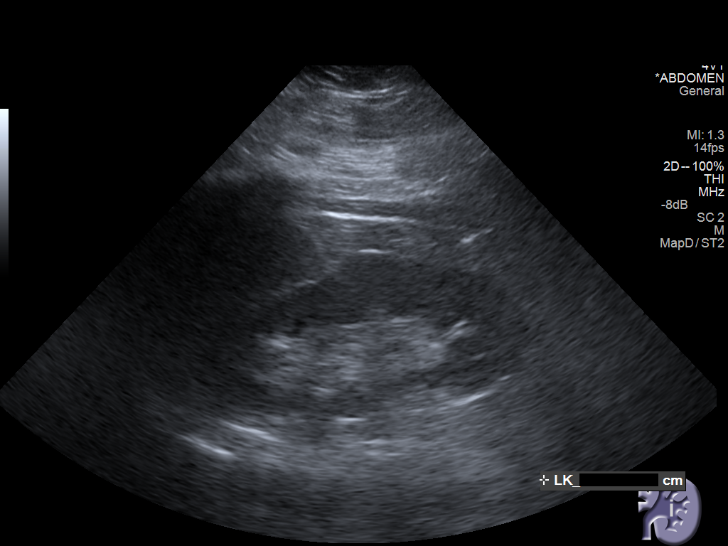
[im 117/128]
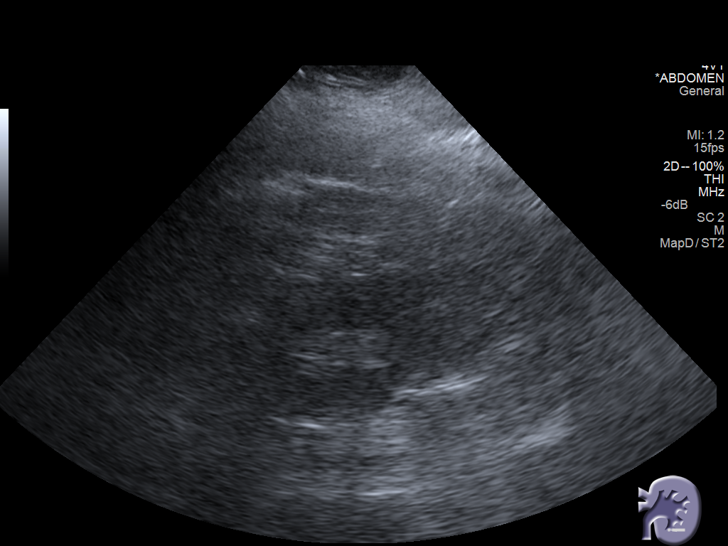
[im 128/128]
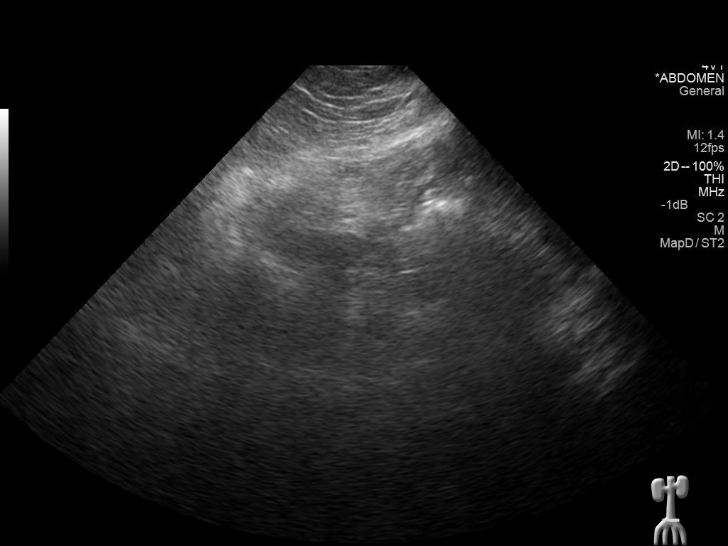

[14 of 25 positions shown; findings below may reference images not displayed]

FINDINGS: Gallbladder: Multiple gallstones largest 1.7 cm. Negative
sonographic Murphy's sign, but the patient was medicated.

Common bile duct: Diameter: Normal measuring 5.1 mm.

Liver: No focal lesion identified.  Within the steatosis.

IVC: No abnormality visualized.

Pancreas: Visualized portion unremarkable.

Spleen: Size and appearance within normal limits.

Right Kidney: Length: 9.8 cm. Echogenicity within normal limits. No
mass or hydronephrosis visualized.

Left Kidney: Length: 12.3 cm. Echogenicity within normal limits. No
mass or hydronephrosis visualized.

Abdominal aorta: No aneurysm visualized.

Other findings: None.
IMPRESSION: Cholelithiasis without definite signs of acute cholecystitis. See
discussion above.

## 2017-07-06 ENCOUNTER — Ambulatory Visit (INDEPENDENT_AMBULATORY_CARE_PROVIDER_SITE_OTHER): Payer: 59 | Admitting: Family Medicine

## 2017-07-06 ENCOUNTER — Encounter: Payer: Self-pay | Admitting: Family Medicine

## 2017-07-06 VITALS — BP 120/70 | HR 130 | Temp 98.7°F | Resp 16 | Wt >= 6400 oz

## 2017-07-06 DIAGNOSIS — Z8719 Personal history of other diseases of the digestive system: Secondary | ICD-10-CM | POA: Diagnosis not present

## 2017-07-06 DIAGNOSIS — R1011 Right upper quadrant pain: Secondary | ICD-10-CM

## 2017-07-06 LAB — COMPLETE METABOLIC PANEL WITH GFR
AG Ratio: 1.1 (calc) (ref 1.0–2.5)
ALT: 27 U/L (ref 6–29)
AST: 16 U/L (ref 10–30)
Albumin: 3.8 g/dL (ref 3.6–5.1)
Alkaline phosphatase (APISO): 82 U/L (ref 33–115)
BUN: 15 mg/dL (ref 7–25)
CO2: 25 mmol/L (ref 20–32)
Calcium: 8.9 mg/dL (ref 8.6–10.2)
Chloride: 102 mmol/L (ref 98–110)
Creat: 0.96 mg/dL (ref 0.50–1.10)
GFR, Est African American: 87 mL/min/{1.73_m2} (ref >=60)
GFR, Est Non African American: 75 mL/min/{1.73_m2} (ref >=60)
Globulin: 3.6 g/dL (calc) (ref 1.9–3.7)
Glucose, Bld: 111 mg/dL — ABNORMAL HIGH (ref 65–99)
Potassium: 4.1 mmol/L (ref 3.5–5.3)
Sodium: 138 mmol/L (ref 135–146)
Total Bilirubin: 0.7 mg/dL (ref 0.2–1.2)
Total Protein: 7.4 g/dL (ref 6.1–8.1)

## 2017-07-06 LAB — CBC
HCT: 38.6 % (ref 35.0–45.0)
Hemoglobin: 12.9 g/dL (ref 11.7–15.5)
MCH: 27.4 pg (ref 27.0–33.0)
MCHC: 33.4 g/dL (ref 32.0–36.0)
MCV: 82 fL (ref 80.0–100.0)
MPV: 11.1 fL (ref 7.5–12.5)
Platelets: 312 10*3/uL (ref 140–400)
RBC: 4.71 10*6/uL (ref 3.80–5.10)
RDW: 13.6 % (ref 11.0–15.0)
WBC: 20.5 10*3/uL — ABNORMAL HIGH (ref 3.8–10.8)

## 2017-07-06 MED ORDER — OXYCODONE-ACETAMINOPHEN 7.5-325 MG PO TABS: 1.0000 | ORAL_TABLET | ORAL | 0 refills | Status: DC | PRN

## 2017-07-06 MED ORDER — METRONIDAZOLE 500 MG PO TABS: 500.0000 mg | ORAL_TABLET | Freq: Three times a day (TID) | ORAL | 0 refills | Status: DC

## 2017-07-06 MED ORDER — CIPROFLOXACIN HCL 500 MG PO TABS: 500.0000 mg | ORAL_TABLET | Freq: Two times a day (BID) | ORAL | 0 refills | Status: AC

## 2017-07-06 MED ORDER — PROMETHAZINE HCL 25 MG PO TABS: 25.0000 mg | ORAL_TABLET | Freq: Three times a day (TID) | ORAL | 0 refills | Status: DC | PRN

## 2017-07-06 NOTE — Progress Notes (Signed)
Patient: Jessica Fields Female    DOB: March 27, 1981   36 y.o.   MRN: [id number] Visit Date: 07/06/2017  Today's Provider: Mila Merry, MD   Chief Complaint  Patient presents with  . Abdominal Pain   Subjective:    Patient stated that she had a severe gallbladder attack 3 days ago. Pain last till the next morning. Patient stated that now pain occurs with movement. Patient has been running a fever 101.2. Also she has had chills, sweats, clammy, nausea and vomiting. Patient has been taking otc Aleve with mild relief.    Abdominal Pain  This is a new problem. The current episode started in the past 7 days (3 days ). The onset quality is sudden. The problem occurs intermittently. The problem has been unchanged. The pain is located in the RUQ. The pain is at a severity of 5/10. The pain is moderate. The quality of the pain is aching and cramping. The abdominal pain does not radiate. Associated symptoms include anorexia, a fever, myalgias, nausea and vomiting. Pertinent negatives include no arthralgias, belching, constipation, diarrhea, dysuria, flatus, frequency, headaches, hematochezia, hematuria, melena or weight loss. The pain is aggravated by movement. Treatments tried: Aleve. The treatment provided mild relief.    Continues to have persistent RUQ pain worse when hitting bumps in car. Had chills yesterday and this morning, very fatigued.   No Known Allergies   Current Outpatient Prescriptions:  .  fluticasone (FLONASE) 50 MCG/ACT nasal spray, Place 2 sprays into both nostrils daily as needed., Disp: , Rfl:   Review of Systems  Constitutional: Positive for fever. Negative for weight loss.  Gastrointestinal: Positive for abdominal pain, anorexia, nausea and vomiting. Negative for constipation, diarrhea, flatus, hematochezia and melena.  Genitourinary: Negative for dysuria, frequency and hematuria.  Musculoskeletal: Positive for myalgias. Negative for arthralgias.  Neurological:  Negative for headaches.    Social History  Substance Use Topics  . Smoking status: Never Smoker  . Smokeless tobacco: Never Used  . Alcohol use 0.0 oz/week     Comment: occasional use   Objective:   BP 120/70 (BP Location: Right Arm, Patient Position: Sitting, Cuff Size: Large)   Pulse (!) 130   Temp 98.7 F (37.1 C) (Oral)   Resp 16   Wt (!) 445 lb (201.9 kg)   SpO2 96%   BMI 71.82 kg/m  Vitals:   07/06/17 1437  BP: 120/70  Pulse: (!) 130  Resp: 16  Temp: 98.7 F (37.1 C)  TempSrc: Oral  SpO2: 96%  Weight: (!) 445 lb (201.9 kg)     Physical Exam  General Appearance:    Alert, cooperative, no distress, obese  Eyes:    PERRL, conjunctiva/corneas clear, EOM's intact       Lungs:     Clear to auscultation bilaterally, respirations unlabored  Heart:    Regular rate and rhythm  Abd::   Morbidly obese, tender right upper quadrant.            Assessment & Plan:     1. RUQ pain  - CBC - COMPLETE METABOLIC PANEL WITH GFR - US Abdomen Limited RUQ; Future - ciprofloxacin (CIPRO) 500 MG tablet; Take 1 tablet (500 mg total) by mouth 2 (two) times daily.  Dispense: 14 tablet; Refill: 0 - metroNIDAZOLE (FLAGYL) 500 MG tablet; Take 1 tablet (500 mg total) by mouth 3 (three) times daily.  Dispense: 21 tablet; Refill: 0 - promethazine (PHENERGAN) 25 MG tablet; Take 1 tablet (25  mg total) by mouth every 8 (eight) hours as needed for nausea or vomiting.  Dispense: 20 tablet; Refill: 0 - oxyCODONE-acetaminophen (PERCOCET) 7.5-325 MG tablet; Take 1 tablet by mouth every 4 (four) hours as needed for severe pain.  Dispense: 30 tablet; Refill: 0  2. History of cholecystitis        Mila Merry, MD  [facility]

## 2017-07-07 ENCOUNTER — Telehealth: Payer: Self-pay | Admitting: *Deleted

## 2017-07-07 ENCOUNTER — Encounter: Payer: Self-pay | Admitting: General Surgery

## 2017-07-07 ENCOUNTER — Ambulatory Visit: Payer: 59 | Admitting: General Surgery

## 2017-07-07 ENCOUNTER — Ambulatory Visit
Admission: RE | Admit: 2017-07-07 | Discharge: 2017-07-07 | Disposition: A | Discharge status: Home / Self Care | Payer: 59 | Source: Ambulatory Visit | Attending: Family Medicine | Admitting: Family Medicine

## 2017-07-07 VITALS — BP 132/70 | HR 87 | Resp 14 | Ht 66.0 in | Wt >= 6400 oz

## 2017-07-07 DIAGNOSIS — K819 Cholecystitis, unspecified: Secondary | ICD-10-CM

## 2017-07-07 DIAGNOSIS — K8020 Calculus of gallbladder without cholecystitis without obstruction: Secondary | ICD-10-CM | POA: Diagnosis not present

## 2017-07-07 DIAGNOSIS — K760 Fatty (change of) liver, not elsewhere classified: Secondary | ICD-10-CM | POA: Insufficient documentation

## 2017-07-07 DIAGNOSIS — K8010 Calculus of gallbladder with chronic cholecystitis without obstruction: Secondary | ICD-10-CM

## 2017-07-07 DIAGNOSIS — R1011 Right upper quadrant pain: Secondary | ICD-10-CM | POA: Diagnosis present

## 2017-07-07 NOTE — Telephone Encounter (Signed)
See message below. Also please schedule referral to general surgery ASAP. Thanks!

## 2017-07-07 NOTE — Progress Notes (Signed)
Patient ID: Jessica Fields, female   DOB: 10-08-1981, 36 y.o.   MRN: [id number]  Chief Complaint  Patient presents with  . Abdominal Pain    HPI Jessica Fields is a 36 y.o. female.  here for evaluation of abdominal pain referred by Dr Sherrie Mustache. She has been having abdominal of pain, previous episode was 2 years ago. She states the pain can be 10/10 at it's worst. The pain last at least 8 hours. Pain is upper right abdomen radiating to her back. This last episode was Sunday associated with nausea and vomiting. Pain has subsided. Tuesday she had chills and fever. She feels it is associated with red meat/steaks. Reports 4 episodes in 10 years and last episode was April 2016. She did not want to go through with the surgery and tried dietary modification. She has sleep apnea and is using CPAP Ultrasound was today.  HPI  Past Medical History:  Diagnosis Date  . History of chicken pox   . History of cholecystitis     Past Surgical History:  Procedure Laterality Date  . Sleep Study  01/25/2005   Performed at [facility] by Dr. Ysidro Evert. Interpretation: very severe Sleep Apnea with nocturnal desaturations. She was improved with nasal Bi-PAP, although was not titrated to optimal pressure. I suspect she has nasal sinus obstruction or congestion    Family History  Problem Relation Age of Onset  . Breast cancer Neg Hx   . Colon cancer Neg Hx     Social History Social History  Substance Use Topics  . Smoking status: Never Smoker  . Smokeless tobacco: Never Used  . Alcohol use 0.0 oz/week     Comment: occasional use    No Known Allergies  Current Outpatient Prescriptions  Medication Sig Dispense Refill  . ciprofloxacin (CIPRO) 500 MG tablet Take 1 tablet (500 mg total) by mouth 2 (two) times daily. 14 tablet 0  . fluticasone (FLONASE) 50 MCG/ACT nasal spray Place 2 sprays into both nostrils daily as needed.    . metroNIDAZOLE (FLAGYL) 500 MG tablet Take 1 tablet (500 mg total) by mouth 3  (three) times daily. 21 tablet 0  . oxyCODONE-acetaminophen (PERCOCET) 7.5-325 MG tablet Take 1 tablet by mouth every 4 (four) hours as needed for severe pain. 30 tablet 0  . promethazine (PHENERGAN) 25 MG tablet Take 1 tablet (25 mg total) by mouth every 8 (eight) hours as needed for nausea or vomiting. 20 tablet 0   No current facility-administered medications for this visit.     Review of Systems Review of Systems  Constitutional: Positive for chills and fever.  Respiratory: Negative.   Cardiovascular: Negative.   Gastrointestinal: Positive for nausea and vomiting.    Blood pressure 132/70, pulse 87, resp. rate 14, height  (1.676 m), weight (!) 438 lb (198.7 kg).  Physical Exam Physical Exam  Constitutional: She is oriented to person, place, and time. She appears well-developed and well-nourished.  HENT:  Mouth/Throat: Oropharynx is clear and moist.  Eyes: Conjunctivae are normal. No scleral icterus.  Neck: Neck supple.  Cardiovascular: Normal rate, regular rhythm and normal heart sounds.   Pulmonary/Chest: Effort normal and breath sounds normal.  Abdominal: Soft. Bowel sounds are normal. There is no tenderness. There is negative Murphy's sign.  Obese abdomen  Lymphadenopathy:    She has no cervical adenopathy.  Neurological: She is alert and oriented to person, place, and time.  Skin: Skin is warm and dry.  Psychiatric: She has a normal mood  and affect. Her behavior is normal.    Data Reviewed Recent US and labs were reviewed [place]- thickened gbw, multiple stones, no Murphy's sign Labs from 07/06/17-WBC was 20 k , LFTS were normal. Assessment    .   Biliary colic, chronic cholecystitis with cholelithiasis. Obesity, sleep apnea.      Plan   Pt  Advised she likely will need elective cholecystectomy but her weight(438lbs) and her body habitus can make surgery technically challenging and increase risks of complications. If she chooses to have surgery in the near future  would suggest she consider having it done in a tertiary care facility.   Counseled on the importance of a low fat diet to prevent future attacks.    Discussed bariatric surgery eval but her insurance will not cover.   Discussion and decision was not completed as I had to take a phone call. When I returned, pt had left the room and her husband voiced displeasure with  my assessment and explanations. He did not want to discuss it any further. He was then referred to our office manager.     HPI, Physical Exam, Assessment and Plan have been scribed under the direction and in the presence of Kathreen Cosier, MD Dorathy Daft, RN I have completed the exam and reviewed the above documentation for accuracy and completeness.  I agree with the above.  Museum/gallery conservator has been used and any errors in dictation or transcription are unintentional.  Cathan Gearin G. Evette Cristal, M.D., F.A.C.S.   Gerlene Burdock G 07/08/2017, 3:18 PM

## 2017-07-07 NOTE — Telephone Encounter (Signed)
-----   Message from Malva Limes, MD sent at 07/07/2017  9:10 AM EDT ----- Multiple stones with thickening of gall bladder wall consistent with gall bladder attacks. WBC was elevated which is consistent with infection. Need to finish antibiotic. Needs referral to general surgery. Needs to see ASAP.

## 2017-07-07 NOTE — Patient Instructions (Signed)
Counseled on the importance of a low fat diet to prevent future attacks and suggested to see a bariatric surgeon for weight loss

## 2017-08-05 ENCOUNTER — Other Ambulatory Visit: Payer: Self-pay | Admitting: Family Medicine

## 2017-08-05 DIAGNOSIS — R1011 Right upper quadrant pain: Secondary | ICD-10-CM

## 2017-08-05 MED ORDER — OXYCODONE-ACETAMINOPHEN 7.5-325 MG PO TABS: 1.0000 | ORAL_TABLET | ORAL | 0 refills | Status: DC | PRN

## 2017-08-05 NOTE — Telephone Encounter (Signed)
Pt contacted office for refill request on the following medications:  oxyCODONE-acetaminophen (PERCOCET) 7.5-325 MG tablet   Pt is in office and stated she needs Rx today b/c she is going to be out and going out of town. Please advise. Thanks TNP

## 2018-03-03 ENCOUNTER — Other Ambulatory Visit: Payer: Self-pay | Admitting: Family Medicine

## 2018-03-03 DIAGNOSIS — R1011 Right upper quadrant pain: Secondary | ICD-10-CM

## 2018-03-03 MED ORDER — OXYCODONE-ACETAMINOPHEN 7.5-325 MG PO TABS
1.0000 | ORAL_TABLET | ORAL | 0 refills | Status: DC | PRN
Start: 2018-03-03 — End: 2018-05-10

## 2018-03-03 NOTE — Telephone Encounter (Signed)
Pt needs refill on Oxycodone 7.5-325  [facility] [address]  Pt's call back [phone]  Thanks Barth Kirks

## 2018-03-03 NOTE — Telephone Encounter (Signed)
Pt called for a status update on refill request for oxyCODONE-acetaminophen (PERCOCET) 7.5-325 MG tablet. Pt stated she is leaving to go to [facility] around 730 pm tonight and was hoping this medication could be sent to [facility] before she leaves. Please advise. Thanks TNP

## 2018-05-10 ENCOUNTER — Other Ambulatory Visit: Payer: Self-pay | Admitting: Family Medicine

## 2018-05-10 DIAGNOSIS — R1011 Right upper quadrant pain: Secondary | ICD-10-CM

## 2018-05-10 MED ORDER — OXYCODONE-ACETAMINOPHEN 7.5-325 MG PO TABS: 1.0000 | ORAL_TABLET | ORAL | 0 refills | Status: DC | PRN

## 2018-05-10 NOTE — Telephone Encounter (Signed)
Pt contacted office for refill request on the following medications:  oxyCODONE-acetaminophen (PERCOCET) 7.5-325 MG tablet  [facility] [address]  Last Rx: 03/03/18 LOV: 07/06/17 Please advise. Thanks TNP

## 2018-05-30 ENCOUNTER — Other Ambulatory Visit: Payer: Self-pay

## 2018-05-30 DIAGNOSIS — R1011 Right upper quadrant pain: Secondary | ICD-10-CM

## 2018-05-30 MED ORDER — OXYCODONE-ACETAMINOPHEN 7.5-325 MG PO TABS: 1.0000 | ORAL_TABLET | ORAL | 0 refills | Status: DC | PRN

## 2018-05-30 NOTE — Telephone Encounter (Signed)
Patient is requesting a refill be on oxyCODONE-acetaminophen (PERCOCET) 7.5-325 MG [facility] Last RF was 05/10/18. Last OV 07/06/17

## 2018-07-04 ENCOUNTER — Other Ambulatory Visit: Payer: Self-pay | Admitting: Family Medicine

## 2018-07-04 DIAGNOSIS — R1011 Right upper quadrant pain: Secondary | ICD-10-CM

## 2018-07-04 MED ORDER — OXYCODONE-ACETAMINOPHEN 7.5-325 MG PO TABS: 1.0000 | ORAL_TABLET | ORAL | 0 refills | Status: DC | PRN

## 2018-07-04 NOTE — Telephone Encounter (Signed)
Pt needs refill on   Oxycodone 7.5-325  [facility] [address]  CB#  [phone]  Thanks Barth Kirksteri

## 2018-07-24 ENCOUNTER — Other Ambulatory Visit: Payer: Self-pay | Admitting: Family Medicine

## 2018-07-24 DIAGNOSIS — R1011 Right upper quadrant pain: Secondary | ICD-10-CM

## 2018-07-24 NOTE — Telephone Encounter (Signed)
Pt contacted office for refill request on the following medications:  oxyCODONE-acetaminophen (PERCOCET) 7.5-325 MG tablet   [facility] [address]  Last Rx: 07/04/18 LOV: 07/06/17 NOV: 07/25/18 Please advise. Thanks TNP

## 2018-07-25 ENCOUNTER — Encounter: Payer: Self-pay | Admitting: Family Medicine

## 2018-07-25 ENCOUNTER — Ambulatory Visit (INDEPENDENT_AMBULATORY_CARE_PROVIDER_SITE_OTHER): Payer: 59 | Admitting: Family Medicine

## 2018-07-25 VITALS — BP 161/107 | HR 94 | Temp 98.7°F | Resp 18 | Wt >= 6400 oz

## 2018-07-25 DIAGNOSIS — M7651 Patellar tendinitis, right knee: Secondary | ICD-10-CM | POA: Diagnosis not present

## 2018-07-25 DIAGNOSIS — Z23 Encounter for immunization: Secondary | ICD-10-CM | POA: Diagnosis not present

## 2018-07-25 DIAGNOSIS — M170 Bilateral primary osteoarthritis of knee: Secondary | ICD-10-CM | POA: Diagnosis not present

## 2018-07-25 DIAGNOSIS — M7652 Patellar tendinitis, left knee: Secondary | ICD-10-CM

## 2018-07-25 MED ORDER — DICLOFENAC SODIUM 1 % TD GEL: 4.0000 g | Freq: Four times a day (QID) | TRANSDERMAL | 5 refills | Status: AC | PRN

## 2018-07-25 MED ORDER — MELOXICAM 15 MG PO TABS: 15.0000 mg | ORAL_TABLET | Freq: Every day | ORAL | 1 refills | Status: DC | PRN

## 2018-07-25 MED ORDER — OXYCODONE-ACETAMINOPHEN 7.5-325 MG PO TABS: 1.0000 | ORAL_TABLET | ORAL | 0 refills | Status: DC | PRN

## 2018-07-25 MED ORDER — LORCASERIN HCL ER 20 MG PO TB24: 1.0000 | ORAL_TABLET | Freq: Every day | ORAL | 5 refills | Status: DC

## 2018-07-25 NOTE — Progress Notes (Signed)
       Patient: Jessica Fields Female    DOB: [birth year]   37 y.o.   MRN: [id number] Visit Date: 07/25/2018  Today's Provider: Mila Merry, MD   Chief Complaint  Patient presents with  . Knee Pain   Subjective:    Knee Pain   Incident onset: several years ago. There was no injury mechanism. The pain is present in the right knee and left knee. The quality of the pain is described as aching (popping sensation). The pain has been worsening since onset. Associated symptoms include muscle weakness. Pertinent negatives include no numbness or tingling. She reports no foreign bodies present. The symptoms are aggravated by weight bearing. She has tried NSAIDs for the symptoms. The treatment provided no relief.  Patient states she has a PMH of arthritis.      No Known Allergies   Current Outpatient Medications:  .  fluticasone (FLONASE) 50 MCG/ACT nasal spray, Place 2 sprays into both nostrils daily as needed., Disp: , Rfl:  .  oxyCODONE-acetaminophen (PERCOCET) 7.5-325 MG tablet, Take 1 tablet by mouth every 4 (four) hours as needed for severe pain., Disp: 30 tablet, Rfl: 0 .  promethazine (PHENERGAN) 25 MG tablet, Take 1 tablet (25 mg total) by mouth every 8 (eight) hours as needed for nausea or vomiting., Disp: 20 tablet, Rfl: 0  Review of Systems  Constitutional: Negative.   Respiratory: Negative.   Cardiovascular: Negative.   Musculoskeletal:       Right knee pain   Neurological: Negative for tingling and numbness.    Social History   Tobacco Use  . Smoking status: Never Smoker  . Smokeless tobacco: Never Used  Substance Use Topics  . Alcohol use: Yes    Alcohol/week: 0.0 standard drinks    Comment: occasional use   Objective:   BP (!) 161/107 (BP Location: Right Arm, Patient Position: Sitting, Cuff Size: Large)   Pulse 94   Temp 98.7 F (37.1 C) (Oral)   Resp 18   Wt (!) 460 lb (208.7 kg)   SpO2 97%   BMI 74.25 kg/m  Vitals:   07/25/18 1443  BP: (!) 161/107    Pulse: 94  Resp: 18  Temp: 98.7 F (37.1 C)  TempSrc: Oral  SpO2: 97%  Weight: (!) 460 lb (208.7 kg)     Physical Exam  General Appearance:    Alert, cooperative, no distress, morbidly obese  Eyes:    PERRL, conjunctiva/corneas clear, EOM's intact       MS:     Tender bilateral patella tendons. No joint line tenderness. Pain with knee extension against resistance.         Assessment & Plan:     1. Patellar tendinitis of both knees  - meloxicam (MOBIC) 15 MG tablet; Take 1 tablet (15 mg total) by mouth daily as needed for pain.  Dispense: 30 tablet; Refill: 1  2. Primary osteoarthritis of both knees  - diclofenac sodium (VOLTAREN) 1 % GEL; Apply 4 g topically 4 (four) times daily as needed.  Dispense: 100 g; Refill: 5  3. Morbid obesity (HCC)  - Lorcaserin HCl ER (BELVIQ XR) 20 MG TB24; Take 1 tablet by mouth daily.  Dispense: 30 tablet; Refill: 5  4. Need for influenza vaccination  - Flu Vaccine QUAD 6+ mos PF IM (Fluarix Quad PF)       Mila Merry, MD  [facility]

## 2018-07-25 NOTE — Patient Instructions (Signed)
   Do 10 straight leg raises at least three times a day to strength the patella tendon   You can go to www.belviq.com for coupon or voucher to cover most of the cost of Belviq.

## 2018-08-08 ENCOUNTER — Other Ambulatory Visit: Payer: Self-pay | Admitting: Family Medicine

## 2018-08-08 DIAGNOSIS — R1011 Right upper quadrant pain: Secondary | ICD-10-CM

## 2018-08-08 MED ORDER — OXYCODONE-ACETAMINOPHEN 7.5-325 MG PO TABS: 1.0000 | ORAL_TABLET | ORAL | 0 refills | Status: DC | PRN

## 2018-08-08 NOTE — Telephone Encounter (Signed)
Pt needs a refill   Oxycodone 7.5-325  [facility] [address]  CB#  [phone]  Thanks Barth Kirks

## 2018-08-18 ENCOUNTER — Other Ambulatory Visit: Payer: Self-pay | Admitting: Family Medicine

## 2018-08-18 DIAGNOSIS — R1011 Right upper quadrant pain: Secondary | ICD-10-CM

## 2018-08-18 MED ORDER — OXYCODONE-ACETAMINOPHEN 7.5-325 MG PO TABS: 1.0000 | ORAL_TABLET | ORAL | 0 refills | Status: DC | PRN

## 2018-08-18 NOTE — Telephone Encounter (Signed)
pt needs refill on   Oxycodone 7.5-325  [facility]  [address]  CB#  [phone]

## 2018-09-05 ENCOUNTER — Encounter: Payer: Self-pay | Admitting: Family Medicine

## 2018-09-05 ENCOUNTER — Ambulatory Visit (INDEPENDENT_AMBULATORY_CARE_PROVIDER_SITE_OTHER): Payer: 59 | Admitting: Family Medicine

## 2018-09-05 ENCOUNTER — Other Ambulatory Visit: Payer: Self-pay | Admitting: Family Medicine

## 2018-09-05 VITALS — BP 160/110 | HR 104 | Temp 99.3°F | Resp 17 | Wt >= 6400 oz

## 2018-09-05 DIAGNOSIS — G510 Bell's palsy: Secondary | ICD-10-CM

## 2018-09-05 MED ORDER — VALACYCLOVIR HCL 1 G PO TABS: 1000.0000 mg | ORAL_TABLET | Freq: Three times a day (TID) | ORAL | 0 refills | Status: DC

## 2018-09-05 MED ORDER — PREDNISONE 20 MG PO TABS: ORAL_TABLET | ORAL | 0 refills | Status: DC

## 2018-09-05 MED ORDER — PREDNISONE 10 MG PO TABS: ORAL_TABLET | ORAL | 0 refills | Status: DC

## 2018-09-05 NOTE — Progress Notes (Signed)
  Subjective:     Patient ID: Jessica Fields, female   DOB: [birth year], 37 y.o.   MRN: [id number] Chief Complaint  Patient presents with  . Numbness    Patient comes in office today with concerns of numbness on the right side of her face. Patient states that symptom began last night and was just her tongue going numb but states as night progressed right side of face became numb. Patient complains also of neck pain for two days or more. Patient denies slurred speech, visual changes, chest pain or arm pain.    HPI States she noticed a mild right facial droop and mild right eyelid weakness. No headache and states he migraines do not have similar neurological sx. Reports white coat hypertension.  Review of Systems     Objective:   Physical Exam  Constitutional: She appears well-developed and well-nourished. No distress.  HENT:  Bilateral ear canals patent/TM's without inflammation  Eyes: Pupils are equal, round, and reactive to light. EOM are normal.  Neck: Carotid bruit is not present.  Cardiovascular: Normal rate and regular rhythm.  Pulmonary/Chest: Breath sounds normal.  Musculoskeletal:  Grip strength 5/5 symmetrically  Neurological: Coordination (finger to nose WNL) normal.  Mild  Right lip droop noted. Lose of right forehead wrinkles with brow lifting. No ptosis or partial lid closure noted (patient remarks it is harder to keep right eye closed on testing).  Skin:  No facial rash noted.       Assessment:    1. Bell's palsy:start prednisone and Valtrex    Plan:    F/u in one week or sooner prn. New symptoms. Discussed artificial tears if eye closure becomes a problem.

## 2018-09-05 NOTE — Patient Instructions (Signed)
Report to the ER for worsening of symptoms: slurred speech, one sided body weakness, trouble with cognition.

## 2018-09-06 ENCOUNTER — Ambulatory Visit: Payer: 59 | Admitting: Physician Assistant

## 2018-09-07 ENCOUNTER — Telehealth: Payer: Self-pay | Admitting: Family Medicine

## 2018-09-07 NOTE — Telephone Encounter (Signed)
Please advise 

## 2018-09-07 NOTE — Telephone Encounter (Signed)
States Bell's palsy sx about the same maybe mildly better. Tolerating prednisone and Valtrex with mild stomach upset. Will f/u as scheduled.

## 2018-09-07 NOTE — Telephone Encounter (Signed)
Pt returning Bob's call.  Please call pt back.  Thanks, [employer]

## 2018-09-12 ENCOUNTER — Ambulatory Visit (INDEPENDENT_AMBULATORY_CARE_PROVIDER_SITE_OTHER): Payer: 59 | Admitting: Family Medicine

## 2018-09-12 ENCOUNTER — Encounter: Payer: Self-pay | Admitting: Family Medicine

## 2018-09-12 VITALS — BP 170/100 | HR 98 | Temp 98.0°F | Resp 16 | Wt >= 6400 oz

## 2018-09-12 DIAGNOSIS — R030 Elevated blood-pressure reading, without diagnosis of hypertension: Secondary | ICD-10-CM

## 2018-09-12 DIAGNOSIS — F439 Reaction to severe stress, unspecified: Secondary | ICD-10-CM

## 2018-09-12 DIAGNOSIS — G510 Bell's palsy: Secondary | ICD-10-CM | POA: Diagnosis not present

## 2018-09-12 MED ORDER — SERTRALINE HCL 50 MG PO TABS: ORAL_TABLET | ORAL | 0 refills | Status: DC

## 2018-09-12 NOTE — Progress Notes (Signed)
  Subjective:     Patient ID: Jessica Fields, female   DOB: 04-21-1981, 37 y.o.   MRN: [id number] Chief Complaint  Patient presents with  . Follow-up    Patient returns to office today to follow up for bells palsy, patient was last seen in office 09/05/18 and prescribed Prednisone and Valtrex. Patient has completed medication and has some slight improvement but states she still has drooping of face and numbness of togue  . Sore Throat    Patient reports sore throat on the right side for the past two days. Patient denies cough, congestion or post nasal drip.    HPI Has completed course of medication. Still has mild residual droop on the right side. Also admits to work stress and wishes to try an SSRI.  Review of Systems     Objective:   Physical Exam  Constitutional: She appears well-developed and well-nourished. No distress.  Neurological:  Able to keep eyes closed against pressure. Brow lift now appears normal. Slight droop of right face noticeable with a pucker.       Assessment:    1. Bell's palsy: improving-discussed natural course  2. Situational stress - sertraline (ZOLOFT) 50 MG tablet; Start at one/half pill daily x 7 days then a whole pill daily  Dispense: 30 tablet; Refill: 0  3. Elevated BP without diagnosis of hypertension    Plan:    Follow up with PCP in two weeks.

## 2018-09-12 NOTE — Patient Instructions (Signed)
Let us know if new symptoms or not continuing to improve.

## 2018-09-20 ENCOUNTER — Other Ambulatory Visit: Payer: Self-pay | Admitting: Family Medicine

## 2018-09-20 DIAGNOSIS — M7652 Patellar tendinitis, left knee: Principal | ICD-10-CM

## 2018-09-20 DIAGNOSIS — M7651 Patellar tendinitis, right knee: Secondary | ICD-10-CM

## 2018-09-27 ENCOUNTER — Ambulatory Visit (INDEPENDENT_AMBULATORY_CARE_PROVIDER_SITE_OTHER): Payer: 59 | Admitting: Family Medicine

## 2018-09-27 ENCOUNTER — Encounter: Payer: Self-pay | Admitting: Family Medicine

## 2018-09-27 VITALS — BP 198/112 | HR 100 | Temp 97.7°F | Resp 16 | Wt >= 6400 oz

## 2018-09-27 DIAGNOSIS — G510 Bell's palsy: Secondary | ICD-10-CM | POA: Diagnosis not present

## 2018-09-27 DIAGNOSIS — R739 Hyperglycemia, unspecified: Secondary | ICD-10-CM | POA: Diagnosis not present

## 2018-09-27 DIAGNOSIS — I10 Essential (primary) hypertension: Secondary | ICD-10-CM | POA: Diagnosis not present

## 2018-09-27 MED ORDER — AMLODIPINE BESYLATE 5 MG PO TABS: 5.0000 mg | ORAL_TABLET | Freq: Every day | ORAL | 1 refills | Status: DC

## 2018-09-27 NOTE — Progress Notes (Signed)
Patient: Jessica Fields Female    DOB: 24-Jan-1981   37 y.o.   MRN: [id number] Visit Date: 09/27/2018  Today's Provider: Mila Merryonald Mozetta Murfin, MD   Chief Complaint  Patient presents with  . Follow-up   Subjective:    HPI Bells Palsy: Patient was last seen for this problem 2 weeks ago by Anola Gurneyobert Chauvin, PA-C. No changes were made. Was seen 11/19 and started on prednisone and valacyclovir. He states sx have now completely resolved and has finished both medications.   Situational Stress: Patient was last seen for this problem 2 weeks ago by Anola Gurneyobert Chauvin, PA-C. Changes made during that visit includes starting Sertraline 50mg . Patient reports good compliance with treatment, good tolerance and good symptom control.    Elevated Blood pressure: Patient was last seen for this problem 2 weeks ago by Anola Gurneyobert Chauvin, PA-C.  BP Readings from Last 5 Encounters:  09/27/18 (!) 198/112  09/12/18 (!) 170/100  09/05/18 (!) 160/110  07/25/18 (!) 161/107  07/07/17 132/70       No Known Allergies   Current Outpatient Medications:  .  diclofenac sodium (VOLTAREN) 1 % GEL, Apply 4 g topically 4 (four) times daily as needed., Disp: 100 g, Rfl: 5 .  fluticasone (FLONASE) 50 MCG/ACT nasal spray, Place 2 sprays into both nostrils daily as needed., Disp: , Rfl:  .  meloxicam (MOBIC) 15 MG tablet, TAKE 1 TABLET BY MOUTH EVERY DAY AS NEEDED FOR PAIN, Disp: 30 tablet, Rfl: 5 .  sertraline (ZOLOFT) 50 MG tablet, Start at one/half pill daily x 7 days then a whole pill daily, Disp: 30 tablet, Rfl: 0 .  oxyCODONE-acetaminophen (PERCOCET) 7.5-325 MG tablet, Take 1 tablet by mouth every 4 (four) hours as needed for severe pain. (Patient not taking: Reported on 09/27/2018), Disp: 30 tablet, Rfl: 0 .  promethazine (PHENERGAN) 25 MG tablet, Take 1 tablet (25 mg total) by mouth every 8 (eight) hours as needed for nausea or vomiting. (Patient not taking: Reported on 09/05/2018), Disp: 20 tablet, Rfl: 0  Review of  Systems  Constitutional: Negative for appetite change, chills, fatigue and fever.  Respiratory: Negative for chest tightness and shortness of breath.   Cardiovascular: Negative for chest pain and palpitations.  Gastrointestinal: Negative for abdominal pain, nausea and vomiting.  Neurological: Negative for dizziness and weakness.    Social History   Tobacco Use  . Smoking status: Never Smoker  . Smokeless tobacco: Never Used  Substance Use Topics  . Alcohol use: Yes    Alcohol/week: 0.0 standard drinks    Comment: occasional use   Objective:   BP (!) 198/112 (BP Location: Right Wrist, Cuff Size: Large)   Pulse 100   Temp 97.7 F (36.5 C) (Oral)   Resp 16   Wt (!) 457 lb (207.3 kg)   SpO2 96% Comment: room air  BMI 73.76 kg/m  Vitals:   09/27/18 1632 09/27/18 1635  BP: (!) 196/112 (!) 198/112  Pulse: 100   Resp: 16   Temp: 97.7 F (36.5 C)   TempSrc: Oral   SpO2: 96%   Weight: (!) 457 lb (207.3 kg)     Physical Exam  General Appearance:    Alert, cooperative, no distress, obese  Eyes:    PERRL, conjunctiva/corneas clear, EOM's intact       Lungs:     Clear to auscultation bilaterally, respirations unlabored  Heart:    Regular rate and rhythm  Neurologic:   Awake, alert, oriented x 3.  No apparent focal neurological           defect.         Assessment & Plan:     1. Essential hypertension Worsening over the last year. Start amlodipine 5mg  today.  - Comprehensive metabolic panel - Lipid panel Call for follow up stop by for BP check in a few days.   2. Hyperglycemia  - Hemoglobin A1c  3. Bell's Palsy Resolved.       Mila Merry, MD  [facility]

## 2018-09-27 NOTE — Patient Instructions (Signed)
   Call if any problems or side effects from amlodipine.

## 2018-09-29 ENCOUNTER — Telehealth: Payer: Self-pay | Admitting: Family Medicine

## 2018-09-29 ENCOUNTER — Other Ambulatory Visit: Payer: Self-pay | Admitting: Family Medicine

## 2018-09-29 DIAGNOSIS — R739 Hyperglycemia, unspecified: Secondary | ICD-10-CM | POA: Diagnosis not present

## 2018-09-29 DIAGNOSIS — I10 Essential (primary) hypertension: Secondary | ICD-10-CM | POA: Diagnosis not present

## 2018-09-29 DIAGNOSIS — R1011 Right upper quadrant pain: Secondary | ICD-10-CM

## 2018-09-29 MED ORDER — OXYCODONE-ACETAMINOPHEN 7.5-325 MG PO TABS: 1.0000 | ORAL_TABLET | ORAL | 0 refills | Status: DC | PRN

## 2018-09-29 NOTE — Telephone Encounter (Signed)
Patient was advised and has not started medication yet. I advised provider that the patient has not started medication and he stated to have patient to start immediately and come in tomorrow (09/30/2018) morning to has BP rechecked. [facility] then come in the morning to get BP rechecked.

## 2018-09-29 NOTE — Telephone Encounter (Signed)
Please check with patient and make sure she was able to get amlodipine started. Need to stop by office today or tomorrow morning just for BP check to make sure it is coming down since it was so high on Wednesday

## 2018-09-29 NOTE — Telephone Encounter (Signed)
Patient needs refill on Oxycodone 7.5-325 [facility] [address]

## 2018-09-29 NOTE — Telephone Encounter (Signed)
Pt returned missed call.  Please call pt back. ° °Thanks, °[facility] °

## 2018-09-29 NOTE — Telephone Encounter (Signed)
Please advise 

## 2018-09-29 NOTE — Telephone Encounter (Signed)
LVMTRC 

## 2018-09-29 NOTE — Telephone Encounter (Signed)
Patient was advised.  

## 2018-09-30 ENCOUNTER — Telehealth: Payer: Self-pay

## 2018-09-30 LAB — LIPID PANEL
Chol/HDL Ratio: 3.8 ratio (ref 0.0–4.4)
Cholesterol, Total: 196 mg/dL (ref 100–199)
HDL: 51 mg/dL (ref >39)
LDL Calculated: 117 mg/dL — ABNORMAL HIGH (ref 0–99)
Triglycerides: 142 mg/dL (ref 0–149)
VLDL Cholesterol Cal: 28 mg/dL (ref 5–40)

## 2018-09-30 LAB — COMPREHENSIVE METABOLIC PANEL
ALT: 54 IU/L — ABNORMAL HIGH (ref 0–32)
AST: 45 IU/L — ABNORMAL HIGH (ref 0–40)
Albumin/Globulin Ratio: 1.1 — ABNORMAL LOW (ref 1.2–2.2)
Albumin: 4.1 g/dL (ref 3.5–5.5)
Alkaline Phosphatase: 91 IU/L (ref 39–117)
BUN/Creatinine Ratio: 13 (ref 9–23)
BUN: 11 mg/dL (ref 6–20)
Bilirubin Total: 0.3 mg/dL (ref 0.0–1.2)
CO2: 21 mmol/L (ref 20–29)
Calcium: 9.3 mg/dL (ref 8.7–10.2)
Chloride: 103 mmol/L (ref 96–106)
Creatinine, Ser: 0.82 mg/dL (ref 0.57–1.00)
GFR calc Af Amer: 104 mL/min/{1.73_m2} (ref >59)
GFR calc non Af Amer: 90 mL/min/{1.73_m2} (ref >59)
Globulin, Total: 3.6 g/dL (ref 1.5–4.5)
Glucose: 107 mg/dL — ABNORMAL HIGH (ref 65–99)
Potassium: 4.1 mmol/L (ref 3.5–5.2)
Sodium: 139 mmol/L (ref 134–144)
Total Protein: 7.7 g/dL (ref 6.0–8.5)

## 2018-09-30 LAB — HEMOGLOBIN A1C
Est. average glucose Bld gHb Est-mCnc: 143 mg/dL
Hgb A1c MFr Bld: 6.6 % — ABNORMAL HIGH (ref 4.8–5.6)

## 2018-09-30 NOTE — Telephone Encounter (Signed)
Pt came in on Saturday 09/30/18 for a blood pressure check.  I used a thigh cuff on left arm 192/92.  I used the Bp machine also on lower left arm at 204/100.

## 2018-10-14 ENCOUNTER — Other Ambulatory Visit: Payer: Self-pay | Admitting: Family Medicine

## 2018-10-14 DIAGNOSIS — F439 Reaction to severe stress, unspecified: Secondary | ICD-10-CM

## 2018-10-16 NOTE — Telephone Encounter (Signed)
See refill request.

## 2018-10-23 ENCOUNTER — Encounter: Payer: Self-pay | Admitting: Family Medicine

## 2018-10-23 ENCOUNTER — Ambulatory Visit (INDEPENDENT_AMBULATORY_CARE_PROVIDER_SITE_OTHER): Payer: 59 | Admitting: Family Medicine

## 2018-10-23 VITALS — BP 176/116 | HR 96 | Temp 97.9°F | Resp 16 | Wt >= 6400 oz

## 2018-10-23 DIAGNOSIS — I10 Essential (primary) hypertension: Secondary | ICD-10-CM

## 2018-10-23 DIAGNOSIS — E119 Type 2 diabetes mellitus without complications: Secondary | ICD-10-CM

## 2018-10-23 MED ORDER — SEMAGLUTIDE(0.25 OR 0.5MG/DOS) 2 MG/1.5ML ~~LOC~~ SOPN: 0.2500 mg | PEN_INJECTOR | SUBCUTANEOUS | 0 refills | Status: DC

## 2018-10-23 MED ORDER — AMLODIPINE BESYLATE 10 MG PO TABS: 10.0000 mg | ORAL_TABLET | Freq: Every day | ORAL | 3 refills | Status: DC

## 2018-10-23 NOTE — Patient Instructions (Addendum)
   Increase amlodipine to 10mg  daily. You can take 2 x 5mg  tablets daily until you run out.    Start Ozempic injections at 0.25mg  once every 7 days for 4 weeks, then increase to 0.5mg  once a week   Ozempic might cause constipation. You may want to take OTC metamucil if you notice any change in bowels.

## 2018-10-23 NOTE — Progress Notes (Signed)
Patient: Jessica Fields Female    DOB: 1981-08-14   38 y.o.   MRN: [id number] Visit Date: 10/23/2018  Today's Provider: Mila Merry, MD   Chief Complaint  Patient presents with  . Hypertension    Follow up   Subjective:     HPI    Hypertension, follow-up:  BP Readings from Last 3 Encounters:  10/23/18 (!) 176/116  09/27/18 (!) 198/112  09/12/18 (!) 170/100    She was last seen for hypertension 3 weeks ago.  BP at that visit was 198/112. Management since that visit includes Started Amlodipine. She reports excellent compliance with treatment. She is not having side effects.  She is exercising. She is adherent to low salt diet.   Outside blood pressures are 140's/70's. She is experiencing none.  Patient denies chest pain, dyspnea, exertional chest pressure/discomfort, fatigue, lower extremity edema and palpitations.   Cardiovascular risk factors include hypertension and obesity (BMI >= 30 kg/m2).  Use of agents associated with hypertension: none.     Weight trend: stable Wt Readings from Last 3 Encounters:  10/23/18 (!) 450 lb (204.1 kg)  09/27/18 (!) 457 lb (207.3 kg)  09/12/18 (!) 448 lb 6.4 oz (203.4 kg)    Current diet: in general, a "healthy" diet    ------------------------------------------------------------------------  Follow up new onset diabetes Lab Results  Component Value Date   HGBA1C 6.6 (H) 09/29/2018   Is planning on starting new diet to help lose weight. No polyria or parasthesias.   No Known Allergies   Current Outpatient Medications:  .  amLODipine (NORVASC) 5 MG tablet, Take 1 tablet (5 mg total) by mouth daily., Disp: 30 tablet, Rfl: 1 .  diclofenac sodium (VOLTAREN) 1 % GEL, Apply 4 g topically 4 (four) times daily as needed., Disp: 100 g, Rfl: 5 .  fluticasone (FLONASE) 50 MCG/ACT nasal spray, Place 2 sprays into both nostrils daily as needed., Disp: , Rfl:  .  oxyCODONE-acetaminophen (PERCOCET) 7.5-325 MG tablet, Take 1  tablet by mouth every 4 (four) hours as needed for severe pain., Disp: 30 tablet, Rfl: 0 .  sertraline (ZOLOFT) 50 MG tablet, TAKE 1/2 TABLET BY MOUTH DAILY FOR 7 DAYS THEN TAKE 1 TABLET DAILY, Disp: 30 tablet, Rfl: 2 .  promethazine (PHENERGAN) 25 MG tablet, Take 1 tablet (25 mg total) by mouth every 8 (eight) hours as needed for nausea or vomiting. (Patient not taking: Reported on 10/23/2018), Disp: 20 tablet, Rfl: 0  Review of Systems  Constitutional: Negative.   Respiratory: Negative.   Cardiovascular: Negative.   Gastrointestinal: Negative.   Neurological: Negative for dizziness, light-headedness and headaches.    Social History   Tobacco Use  . Smoking status: Never Smoker  . Smokeless tobacco: Never Used  Substance Use Topics  . Alcohol use: Yes    Alcohol/week: 0.0 standard drinks    Comment: occasional use      Objective:   BP (!) 176/116 (BP Location: Right Arm, Patient Position: Sitting, Cuff Size: Large)   Pulse 96   Temp 97.9 F (36.6 C) (Oral)   Resp 16   Wt (!) 450 lb (204.1 kg)   BMI 72.63 kg/m  Vitals:   10/23/18 1626  BP: (!) 176/116  Pulse: 96  Resp: 16  Temp: 97.9 F (36.6 C)  TempSrc: Oral  Weight: (!) 450 lb (204.1 kg)     Physical Exam  General Appearance:    Alert, cooperative, no distress, morbidly obese  Eyes:  PERRL, conjunctiva/corneas clear, EOM's intact       Lungs:     Clear to auscultation bilaterally, respirations unlabored  Heart:    Regular rate and rhythm  Neurologic:   Awake, alert, oriented x 3. No apparent focal neurological           defect.          Assessment & Plan    1. Type 2 diabetes mellitus without complication, without long-term current use of insulin (HCC) Start samples- Semaglutide,0.25 or 0.5MG /DOS, (OZEMPIC, 0.25 OR 0.5 MG/DOSE,) 2 MG/1.5ML SOPN; Inject 0.25 mg into the skin once a week. For four weeks, then increase to 0.5mg  once a week  Dispense: 1 pen; Refill: 0  2. Morbid obesity (HCC) Counseled  regarding prudent diet and regular exercise.    3. Essential hypertension Tolerating initiation of amlodipine, with improved, but still very elevated BP, increase- amLODipine (NORVASC) to10 MG tablet; Take 1 tablet (10 mg total) by mouth daily.  Dispense: 30 tablet; Refill: 3  Follow up 1 month.      Mila Merry, MD  [facility]

## 2018-10-27 ENCOUNTER — Other Ambulatory Visit: Payer: Self-pay

## 2018-10-27 DIAGNOSIS — R1011 Right upper quadrant pain: Secondary | ICD-10-CM

## 2018-10-27 MED ORDER — OXYCODONE-ACETAMINOPHEN 7.5-325 MG PO TABS: 1.0000 | ORAL_TABLET | ORAL | 0 refills | Status: DC | PRN

## 2018-10-27 NOTE — Telephone Encounter (Signed)
[facility] called [facility]ills[facility] [address] Thanks!

## 2018-11-17 ENCOUNTER — Other Ambulatory Visit: Payer: Self-pay | Admitting: Family Medicine

## 2018-11-17 DIAGNOSIS — R1011 Right upper quadrant pain: Secondary | ICD-10-CM

## 2018-11-17 MED ORDER — OXYCODONE-ACETAMINOPHEN 7.5-325 MG PO TABS: 1.0000 | ORAL_TABLET | ORAL | 0 refills | Status: DC | PRN

## 2018-11-17 NOTE — Telephone Encounter (Signed)
Pt needing a refill on: ° °oxyCODONE-acetaminophen (PERCOCET) 7.5-325 MG tablet ° °Please fill at: ° °[id number] [place] [place] - [address] [phone] (Phone) °[phone] (Fax)  ° °Thanks, °[facility] °

## 2018-12-06 ENCOUNTER — Encounter: Payer: Self-pay | Admitting: Family Medicine

## 2018-12-06 ENCOUNTER — Ambulatory Visit (INDEPENDENT_AMBULATORY_CARE_PROVIDER_SITE_OTHER): Payer: 59 | Admitting: Family Medicine

## 2018-12-06 ENCOUNTER — Other Ambulatory Visit: Payer: Self-pay | Admitting: Family Medicine

## 2018-12-06 VITALS — BP 168/98 | HR 97 | Temp 98.0°F | Resp 16 | Wt >= 6400 oz

## 2018-12-06 DIAGNOSIS — M25561 Pain in right knee: Secondary | ICD-10-CM

## 2018-12-06 DIAGNOSIS — I10 Essential (primary) hypertension: Secondary | ICD-10-CM

## 2018-12-06 DIAGNOSIS — F439 Reaction to severe stress, unspecified: Secondary | ICD-10-CM

## 2018-12-06 DIAGNOSIS — E119 Type 2 diabetes mellitus without complications: Secondary | ICD-10-CM

## 2018-12-06 DIAGNOSIS — G8929 Other chronic pain: Secondary | ICD-10-CM

## 2018-12-06 DIAGNOSIS — M25562 Pain in left knee: Secondary | ICD-10-CM

## 2018-12-06 MED ORDER — SEMAGLUTIDE (1 MG/DOSE) 2 MG/1.5ML ~~LOC~~ SOPN: 1.0000 mg | PEN_INJECTOR | SUBCUTANEOUS | 5 refills | Status: DC

## 2018-12-06 MED ORDER — LISINOPRIL 10 MG PO TABS: 10.0000 mg | ORAL_TABLET | Freq: Every day | ORAL | 3 refills | Status: DC

## 2018-12-06 MED ORDER — NAPROXEN 500 MG PO TABS: 500.0000 mg | ORAL_TABLET | Freq: Two times a day (BID) | ORAL | 2 refills | Status: DC | PRN

## 2018-12-06 MED ORDER — SEMAGLUTIDE(0.25 OR 0.5MG/DOS) 2 MG/1.5ML ~~LOC~~ SOPN: 0.2500 mg | PEN_INJECTOR | SUBCUTANEOUS | 0 refills | Status: AC

## 2018-12-06 NOTE — Progress Notes (Signed)
Patient: Jessica Fields Female    DOB: 1981/08/27   38 y.o.   MRN: [id number] Visit Date: 12/06/2018  Today's Provider: Mila Merry, MD   Chief Complaint  Patient presents with  . Hypertension   Subjective:     HPI  Hypertension, follow-up:  BP Readings from Last 3 Encounters:  12/06/18 (!) 168/98  10/23/18 (!) 176/116  09/27/18 (!) 198/112    She was last seen for hypertension 1 months ago.  BP at that visit was 176/116. Management since that visit includes increasing Amlodipine to 10mg  daily. She reports good compliance with treatment. She is not having side effects.  She is exercising. She is adherent to low salt diet.   Outside blood pressures are 145/80. She is experiencing none.  Patient denies chest pain, chest pressure/discomfort, claudication, dyspnea, exertional chest pressure/discomfort, fatigue, irregular heart beat, lower extremity edema, near-syncope, orthopnea, palpitations, paroxysmal nocturnal dyspnea, syncope and tachypnea.   Cardiovascular risk factors include diabetes mellitus, hypertension and obesity (BMI >= 30 kg/m2).  Use of agents associated with hypertension: none.     Weight trend: fluctuating a bit Wt Readings from Last 3 Encounters:  12/06/18 (!) 456 lb (206.8 kg)  10/23/18 (!) 450 lb (204.1 kg)  09/27/18 (!) 457 lb (207.3 kg)    Current diet: in general, an "unhealthy" diet  ------------------------------------------------------------------------ Follow up Diabetes Lab Results  Component Value Date   HGBA1C 6.6 (H) 09/29/2018   Has finished samples of Ozempic and titrated to 0.5mg  a week. Is tolerating well with no side effects.   No Known Allergies   Current Outpatient Medications:  .  amLODipine (NORVASC) 10 MG tablet, Take 1 tablet (10 mg total) by mouth daily., Disp: 30 tablet, Rfl: 3 .  diclofenac sodium (VOLTAREN) 1 % GEL, Apply 4 g topically 4 (four) times daily as needed., Disp: 100 g, Rfl: 5 .  fluticasone  (FLONASE) 50 MCG/ACT nasal spray, Place 2 sprays into both nostrils daily as needed., Disp: , Rfl:  .  oxyCODONE-acetaminophen (PERCOCET) 7.5-325 MG tablet, Take 1 tablet by mouth every 4 (four) hours as needed for severe pain., Disp: 30 tablet, Rfl: 0 .  promethazine (PHENERGAN) 25 MG tablet, Take 1 tablet (25 mg total) by mouth every 8 (eight) hours as needed for nausea or vomiting., Disp: 20 tablet, Rfl: 0 .  Semaglutide,0.25 or 0.5MG /DOS, (OZEMPIC, 0.25 OR 0.5 MG/DOSE,) 2 MG/1.5ML SOPN, Inject 0.25 mg into the skin once a week. For four weeks, then increase to 0.5mg  once a week, Disp: 1 pen, Rfl: 0 .  sertraline (ZOLOFT) 50 MG tablet, TAKE 1/2 TABLET BY MOUTH DAILY FOR 7 DAYS THEN TAKE 1 TABLET DAILY, Disp: 30 tablet, Rfl: 2  Review of Systems  Constitutional: Negative for appetite change, chills, fatigue and fever.  Respiratory: Negative for chest tightness and shortness of breath.   Cardiovascular: Negative for chest pain and palpitations.  Gastrointestinal: Negative for abdominal pain, nausea and vomiting.  Neurological: Negative for dizziness and weakness.    Social History   Tobacco Use  . Smoking status: Never Smoker  . Smokeless tobacco: Never Used  Substance Use Topics  . Alcohol use: Yes    Alcohol/week: 0.0 standard drinks    Comment: occasional use      Objective:   BP (!) 168/98 (BP Location: Right Wrist, Cuff Size: Large)   Pulse 97   Temp 98 F (36.7 C) (Oral)   Resp 16   Wt (!) 456 lb (206.8 kg)  SpO2 97% Comment: room air  BMI 73.60 kg/m  Vitals:   12/06/18 0850 12/06/18 0853  BP: (!) 170/100 (!) 168/98  Pulse: 97   Resp: 16   Temp: 98 F (36.7 C)   TempSrc: Oral   SpO2: 97%   Weight: (!) 456 lb (206.8 kg)      Physical Exam  General appearance: alert, well developed, well nourished, cooperative and in no distress Head: Normocephalic, without obvious abnormality, atraumatic Respiratory: Respirations even and unlabored, normal respiratory  rate Extremities: No gross deformities Skin: Skin color, texture, turgor normal. No rashes seen  Psych: Appropriate mood and affect. Neurologic: Mental status: Alert, oriented to person, place, and time, thought content appropriate.    Office Visit from 12/06/2018 in [facility]  PHQ-2 Total Score  1          Assessment & Plan    1. Essential hypertension Much improved, but BP not to goal since increasing amlodipine to 10mg  daily. Add lisinopril 10mg  dialy  2. Type 2 diabetes mellitus without complication, without long-term current use of insulin (HCC) Doing well with 0.5mg  Ozempic. Given new prescription for 1mg  per week. Advised that if not covered by insurance we will change to metformin. - Semaglutide,0.25 or 0.5MG /DOS, (OZEMPIC, 0.25 OR 0.5 MG/DOSE,) 2 MG/1.5ML SOPN; Inject 0.25 mg into the skin once a week for 4 days. For four weeks, then increase to 0.5mg  once a week  Dispense: 1 pen; Refill: 0  3. Morbid obesity (HCC) Hopefully will be able to fill Ozempic 1mg  prescription which should help loose weight.   4. Bilateral knee pain Miminal relief with meloxicam and Voltaren gel. Try naprosyn 500mg  bid prn.   5. Depression/anxiety She reports sertraline is helping and wishes to continue same dose.   Future Appointments  Date Time Provider Department Center  01/05/2019  4:20 PM Sherrie Mustache, Demetrios Isaacs, MD [facility] None        Mila Merry, MD  [facility]

## 2018-12-06 NOTE — Patient Instructions (Signed)
.   Please review the attached list of medications and notify my office if there are any errors.   . Please bring all of your medications to every appointment so we can make sure that our medication list is the same as yours.   

## 2018-12-06 NOTE — Telephone Encounter (Addendum)
[facility] faxed refill request for the following medications:  lisinopril (PRINIVIL,ZESTRIL) 10 MG tablet  amLODipine (NORVASC) 10 MG tablet  sertraline (ZOLOFT) 50 MG tablet   Please advise.

## 2018-12-06 NOTE — Telephone Encounter (Deleted)
e

## 2018-12-07 ENCOUNTER — Other Ambulatory Visit: Payer: Self-pay | Admitting: Family Medicine

## 2018-12-07 DIAGNOSIS — R1011 Right upper quadrant pain: Secondary | ICD-10-CM

## 2018-12-07 MED ORDER — SERTRALINE HCL 50 MG PO TABS: ORAL_TABLET | ORAL | 3 refills | Status: DC

## 2018-12-07 MED ORDER — OXYCODONE-ACETAMINOPHEN 7.5-325 MG PO TABS: 1.0000 | ORAL_TABLET | ORAL | 0 refills | Status: DC | PRN

## 2018-12-07 MED ORDER — AMLODIPINE BESYLATE 10 MG PO TABS: 10.0000 mg | ORAL_TABLET | Freq: Every day | ORAL | 3 refills | Status: DC

## 2018-12-07 MED ORDER — LISINOPRIL 10 MG PO TABS: 10.0000 mg | ORAL_TABLET | Freq: Every day | ORAL | 1 refills | Status: DC

## 2018-12-07 NOTE — Telephone Encounter (Signed)
Pt needing a refill on: ° °oxyCODONE-acetaminophen (PERCOCET) 7.5-325 MG tablet ° °Please fill at: ° °[id number] [place] [place] - [address] [phone] (Phone) °[phone] (Fax)  ° °Thanks, °[facility] °

## 2018-12-25 ENCOUNTER — Telehealth: Payer: Self-pay | Admitting: Family Medicine

## 2018-12-25 ENCOUNTER — Other Ambulatory Visit: Payer: Self-pay | Admitting: Family Medicine

## 2018-12-25 DIAGNOSIS — R1011 Right upper quadrant pain: Secondary | ICD-10-CM

## 2018-12-25 MED ORDER — OXYCODONE-ACETAMINOPHEN 7.5-325 MG PO TABS: 1.0000 | ORAL_TABLET | ORAL | 0 refills | Status: DC | PRN

## 2018-12-25 NOTE — Telephone Encounter (Signed)
Its for knee and back pain

## 2018-12-25 NOTE — Telephone Encounter (Signed)
Pt is just now wanting the rx filled and pharmacy is asking if it's ok to fill the rx because its an old rx from [place] and if ok to fill the rx then a new rx needs to be wrote.  Pt has called asking pharmacy to refill.

## 2018-12-25 NOTE — Telephone Encounter (Signed)
Natalia Leatherwood w/ [facility]  [phone]  oxyCODONE-acetaminophen (PERCOCET) 7.5-325 MG tablet Needing to know what the Rx was written for. Pt is needing the refill.  Please advise.  Thanks, [employer]

## 2018-12-25 NOTE — Telephone Encounter (Signed)
Please advise 

## 2018-12-25 NOTE — Telephone Encounter (Signed)
Pt calling to check on the status of the refill.  Please advise.  Thanks, [employer]

## 2019-01-05 ENCOUNTER — Ambulatory Visit: Payer: Self-pay | Admitting: Family Medicine

## 2019-01-15 ENCOUNTER — Other Ambulatory Visit: Payer: Self-pay

## 2019-01-15 DIAGNOSIS — R1011 Right upper quadrant pain: Secondary | ICD-10-CM

## 2019-01-15 MED ORDER — OXYCODONE-ACETAMINOPHEN 7.5-325 MG PO TABS: 1.0000 | ORAL_TABLET | ORAL | 0 refills | Status: DC | PRN

## 2019-02-05 ENCOUNTER — Other Ambulatory Visit: Payer: Self-pay | Admitting: *Deleted

## 2019-02-05 DIAGNOSIS — R1011 Right upper quadrant pain: Secondary | ICD-10-CM

## 2019-02-05 MED ORDER — OXYCODONE-ACETAMINOPHEN 7.5-325 MG PO TABS: 1.0000 | ORAL_TABLET | ORAL | 0 refills | Status: DC | PRN

## 2019-02-19 ENCOUNTER — Other Ambulatory Visit: Payer: Self-pay | Admitting: Family Medicine

## 2019-02-19 DIAGNOSIS — F439 Reaction to severe stress, unspecified: Secondary | ICD-10-CM

## 2019-02-19 MED ORDER — SERTRALINE HCL 50 MG PO TABS: 50.0000 mg | ORAL_TABLET | Freq: Every day | ORAL | 1 refills | Status: DC

## 2019-02-19 NOTE — Telephone Encounter (Signed)
[facility] following medications:  sertraline (ZOLOFT) 50 MG tablet   Please advise.  

## 2019-02-23 ENCOUNTER — Other Ambulatory Visit: Payer: Self-pay | Admitting: *Deleted

## 2019-02-23 DIAGNOSIS — R1011 Right upper quadrant pain: Secondary | ICD-10-CM

## 2019-02-23 MED ORDER — OXYCODONE-ACETAMINOPHEN 7.5-325 MG PO TABS: 1.0000 | ORAL_TABLET | ORAL | 0 refills | Status: DC | PRN

## 2019-03-09 ENCOUNTER — Other Ambulatory Visit: Payer: Self-pay

## 2019-03-09 DIAGNOSIS — R1011 Right upper quadrant pain: Secondary | ICD-10-CM

## 2019-03-09 NOTE — Telephone Encounter (Signed)
Patient called requesting refills. [facility] [address] Thanks!

## 2019-03-11 MED ORDER — OXYCODONE-ACETAMINOPHEN 7.5-325 MG PO TABS: 1.0000 | ORAL_TABLET | ORAL | 0 refills | Status: DC | PRN

## 2019-03-29 ENCOUNTER — Telehealth: Payer: Self-pay

## 2019-03-29 NOTE — Telephone Encounter (Signed)
Patient is requesting a refill on Oxycodone. 

## 2019-03-30 ENCOUNTER — Other Ambulatory Visit: Payer: Self-pay

## 2019-03-30 ENCOUNTER — Telehealth: Payer: Self-pay

## 2019-03-30 DIAGNOSIS — R1011 Right upper quadrant pain: Secondary | ICD-10-CM

## 2019-03-30 MED ORDER — OXYCODONE-ACETAMINOPHEN 7.5-325 MG PO TABS: 1.0000 | ORAL_TABLET | ORAL | 0 refills | Status: DC | PRN

## 2019-03-30 NOTE — Telephone Encounter (Signed)
Patient called stating that she has had a dry cough for several months which she thinks is related to Lisinopril. Patient wants to know if there are any remidies. The cough was only at night, but has now started to occur during the day and at night. She says it feels like a dry tickle in the back of there throat. Please advise.

## 2019-03-30 NOTE — Telephone Encounter (Signed)
Pt advised.   Thanks,   -Isaid Salvia  

## 2019-03-30 NOTE — Telephone Encounter (Signed)
Patient requesting refill. She called yesterday also. She is completely out of medication.

## 2019-03-30 NOTE — Telephone Encounter (Signed)
Try taking a daily allergy medication, such as Allegra (fexofenadine) or Xyzal (levocetirizine). If not improved within 2 weeks we could try  A different bp pill.

## 2019-04-09 ENCOUNTER — Other Ambulatory Visit: Payer: Self-pay

## 2019-04-09 DIAGNOSIS — R1011 Right upper quadrant pain: Secondary | ICD-10-CM

## 2019-04-10 MED ORDER — OXYCODONE-ACETAMINOPHEN 7.5-325 MG PO TABS: 1.0000 | ORAL_TABLET | ORAL | 0 refills | Status: DC | PRN

## 2019-04-19 ENCOUNTER — Telehealth: Payer: Self-pay | Admitting: Family Medicine

## 2019-04-19 ENCOUNTER — Other Ambulatory Visit: Payer: Self-pay

## 2019-04-19 DIAGNOSIS — R1011 Right upper quadrant pain: Secondary | ICD-10-CM

## 2019-04-19 MED ORDER — OXYCODONE-ACETAMINOPHEN 7.5-325 MG PO TABS: 1.0000 | ORAL_TABLET | ORAL | 0 refills | Status: DC | PRN

## 2019-04-19 NOTE — Telephone Encounter (Signed)
Pt needs refill oxycodone 7.5-325  [facility] [address]  Thanks teri

## 2019-04-21 ENCOUNTER — Other Ambulatory Visit: Payer: Self-pay | Admitting: Family Medicine

## 2019-04-21 DIAGNOSIS — I10 Essential (primary) hypertension: Secondary | ICD-10-CM

## 2019-04-27 ENCOUNTER — Other Ambulatory Visit: Payer: Self-pay | Admitting: Family Medicine

## 2019-04-27 DIAGNOSIS — R1011 Right upper quadrant pain: Secondary | ICD-10-CM

## 2019-04-27 NOTE — Telephone Encounter (Signed)
Pt needing refill:  oxyCODONE-acetaminophen (PERCOCET) 7.5-325 MG tablet  Please fill at: [id number] [address], [place] [phone] (Phone) [phone] (Fax)   Thanks, [employer]

## 2019-04-28 MED ORDER — OXYCODONE-ACETAMINOPHEN 7.5-325 MG PO TABS: 1.0000 | ORAL_TABLET | ORAL | 0 refills | Status: DC | PRN

## 2019-05-04 ENCOUNTER — Other Ambulatory Visit: Payer: Self-pay | Admitting: Family Medicine

## 2019-05-04 DIAGNOSIS — R1011 Right upper quadrant pain: Secondary | ICD-10-CM

## 2019-05-04 MED ORDER — OXYCODONE-ACETAMINOPHEN 7.5-325 MG PO TABS: 1.0000 | ORAL_TABLET | ORAL | 0 refills | Status: DC | PRN

## 2019-05-04 NOTE — Telephone Encounter (Signed)
Pt needs a refil on her  Oxycodone 7.5-325  [facility] [address]  [employer]

## 2019-05-14 ENCOUNTER — Other Ambulatory Visit: Payer: Self-pay | Admitting: Family Medicine

## 2019-05-14 DIAGNOSIS — M25561 Pain in right knee: Secondary | ICD-10-CM

## 2019-05-14 DIAGNOSIS — G8929 Other chronic pain: Secondary | ICD-10-CM

## 2019-05-14 NOTE — Telephone Encounter (Signed)
Pt contacted office for refill request on the following medications:  oxyCODONE-acetaminophen (PERCOCET) 7.5-325 MG tablet  [facility] [address] Last Rx: 05/04/2019 LOV: 12/06/2018 Please advise. Thanks [facility]NP

## 2019-05-16 ENCOUNTER — Other Ambulatory Visit: Payer: Self-pay | Admitting: Family Medicine

## 2019-05-16 DIAGNOSIS — R1011 Right upper quadrant pain: Secondary | ICD-10-CM

## 2019-05-16 MED ORDER — OXYCODONE-ACETAMINOPHEN 7.5-325 MG PO TABS: 1.0000 | ORAL_TABLET | ORAL | 0 refills | Status: DC | PRN

## 2019-05-16 NOTE — Telephone Encounter (Signed)
Pt called Monday for a refill on her   Oxycodone.  She is still waiting for the refill  [facility]  [address]  CB#  [phone]  [place]

## 2019-05-16 NOTE — Telephone Encounter (Signed)
Pt advised.  Apt for 06/19/2019 at 4pm

## 2019-05-16 NOTE — Telephone Encounter (Signed)
Have sent refill, but she is overdue for follow up of dm and htn. She needs to schedule follow up in the next few weeks. Will not refill any more medications until o.v. is scheduled.

## 2019-05-25 ENCOUNTER — Other Ambulatory Visit: Payer: Self-pay | Admitting: Family Medicine

## 2019-05-25 DIAGNOSIS — R1011 Right upper quadrant pain: Secondary | ICD-10-CM

## 2019-05-25 DIAGNOSIS — I10 Essential (primary) hypertension: Secondary | ICD-10-CM

## 2019-05-25 NOTE — Telephone Encounter (Signed)
Pt needing a refill on:  oxyCODONE-acetaminophen (PERCOCET) 7.5-325 MG tablet  Please fill at:   [id number] [address], [place] [phone] (Phone) [phone] (Fax)   Thanks, [employer]

## 2019-05-27 MED ORDER — OXYCODONE-ACETAMINOPHEN 7.5-325 MG PO TABS: 1.0000 | ORAL_TABLET | ORAL | 0 refills | Status: DC | PRN

## 2019-06-01 ENCOUNTER — Other Ambulatory Visit: Payer: Self-pay | Admitting: Family Medicine

## 2019-06-01 DIAGNOSIS — R1011 Right upper quadrant pain: Secondary | ICD-10-CM

## 2019-06-01 NOTE — Telephone Encounter (Signed)
Pt needing refill on:  oxyCODONE-acetaminophen (PERCOCET) 7.5-325 MG tablet   Please fill at: [id number] [address], [place] [phone] (Phone) [phone] (Fax)     Thanks, [employer]

## 2019-06-03 MED ORDER — OXYCODONE-ACETAMINOPHEN 7.5-325 MG PO TABS: 1.0000 | ORAL_TABLET | ORAL | 0 refills | Status: DC | PRN

## 2019-06-07 ENCOUNTER — Other Ambulatory Visit: Payer: Self-pay

## 2019-06-07 DIAGNOSIS — R1011 Right upper quadrant pain: Secondary | ICD-10-CM

## 2019-06-07 NOTE — Telephone Encounter (Signed)
Patient called requesting refill. She has a follow up appointment scheduled 06/19/2019.

## 2019-06-08 MED ORDER — OXYCODONE-ACETAMINOPHEN 7.5-325 MG PO TABS: 1.0000 | ORAL_TABLET | ORAL | 0 refills | Status: DC | PRN

## 2019-06-08 NOTE — Telephone Encounter (Signed)
Patient called back to check on the status of this refill request. She says she is going out of town today and needs this done asap. Please review.

## 2019-06-18 ENCOUNTER — Other Ambulatory Visit: Payer: Self-pay | Admitting: Family Medicine

## 2019-06-18 DIAGNOSIS — I10 Essential (primary) hypertension: Secondary | ICD-10-CM

## 2019-06-19 ENCOUNTER — Other Ambulatory Visit: Payer: Self-pay

## 2019-06-19 ENCOUNTER — Encounter: Payer: Self-pay | Admitting: Family Medicine

## 2019-06-19 ENCOUNTER — Ambulatory Visit (INDEPENDENT_AMBULATORY_CARE_PROVIDER_SITE_OTHER): Payer: 59 | Admitting: Family Medicine

## 2019-06-19 VITALS — BP 126/64 | HR 96 | Temp 97.1°F | Resp 16 | Wt >= 6400 oz

## 2019-06-19 DIAGNOSIS — E119 Type 2 diabetes mellitus without complications: Secondary | ICD-10-CM | POA: Diagnosis not present

## 2019-06-19 DIAGNOSIS — F1190 Opioid use, unspecified, uncomplicated: Secondary | ICD-10-CM

## 2019-06-19 DIAGNOSIS — M25561 Pain in right knee: Secondary | ICD-10-CM | POA: Diagnosis not present

## 2019-06-19 DIAGNOSIS — G8929 Other chronic pain: Secondary | ICD-10-CM

## 2019-06-19 DIAGNOSIS — Z8719 Personal history of other diseases of the digestive system: Secondary | ICD-10-CM

## 2019-06-19 DIAGNOSIS — I10 Essential (primary) hypertension: Secondary | ICD-10-CM

## 2019-06-19 DIAGNOSIS — M25562 Pain in left knee: Secondary | ICD-10-CM

## 2019-06-19 LAB — POCT GLYCOSYLATED HEMOGLOBIN (HGB A1C): Hemoglobin A1C: 6.7 % — AB (ref 4.0–5.6)

## 2019-06-19 MED ORDER — METFORMIN HCL 500 MG PO TABS: ORAL_TABLET | ORAL | 1 refills | Status: DC

## 2019-06-19 NOTE — Patient Instructions (Signed)
.   Please review the attached list of medications and notify my office if there are any errors.   . Please bring all of your medications to every appointment so we can make sure that our medication list is the same as yours.   . It is especially important to get the annual flu vaccine this year. If you haven't had it already, please go to your pharmacy or call the office as soon as possible to schedule you flu shot.  

## 2019-06-19 NOTE — Progress Notes (Signed)
[place],      Patient: Jessica Fields Female    DOB: 1981/10/08   38 y.o.   MRN: [id number] Visit Date: 06/19/2019  Today's Provider: Lelon Huh, MD   Chief Complaint  Patient presents with  . Diabetes  . Hypertension  . Depression   Subjective:     HPI  Diabetes Mellitus Type II, Follow-up:   Lab Results  Component Value Date   HGBA1C 6.6 (H) 09/29/2018    Last seen for diabetes 7 months ago.  Management since then includes increased Ozempic to 1mg  per week. She did use samples of Ozempic, but did not get prescription filled due to not being covered by insurance. She has been trying to eat healthier.  She is not having side effects. Current symptoms include none and have been stable. Home blood sugar records: not currently being checked  Episodes of hypoglycemia? N/A   Current insulin regiment: Is not on insulin Most Recent Eye Exam: patient reports about 14 months Weight trend: stable Prior visit with dietician: No Current exercise: none Current diet habits: patient working on improving  Pertinent Labs:    Component Value Date/Time   CHOL 196 09/29/2018 1622   TRIG 142 09/29/2018 1622   HDL 51 09/29/2018 1622   LDLCALC 117 (H) 09/29/2018 1622   CREATININE 0.82 09/29/2018 1622   CREATININE 0.96 07/06/2017 1508    Wt Readings from Last 3 Encounters:  06/19/19 (!) 457 lb 3.2 oz (207.4 kg)  12/06/18 (!) 456 lb (206.8 kg)  10/23/18 (!) 450 lb (204.1 kg)    ------------------------------------------------------------------------  Hypertension, follow-up:  BP Readings from Last 3 Encounters:  06/19/19 126/64  12/06/18 (!) 168/98  10/23/18 (!) 176/116    She was last seen for hypertension 7 months ago.  BP at that visit was 168/98. Management changes since that visit include adding Lisinopril 10mg . She reports excellent compliance with treatment. She is not having side effects.  She is exercising. She is adherent to low salt diet.   Outside blood  pressures are systolic ranging 160-109 and diastolic ranging from 32-35. She is experiencing none.  Patient denies chest pain, chest pressure/discomfort, claudication, dyspnea, exertional chest pressure/discomfort, fatigue, irregular heart beat, lower extremity edema, near-syncope, orthopnea, palpitations, paroxysmal nocturnal dyspnea, syncope and tachypnea.   Cardiovascular risk factors include diabetes mellitus, hypertension and obesity (BMI >= 30 kg/m2).  Use of agents associated with hypertension: none.     Weight trend: stable Wt Readings from Last 3 Encounters:  06/19/19 (!) 457 lb 3.2 oz (207.4 kg)  12/06/18 (!) 456 lb (206.8 kg)  10/23/18 (!) 450 lb (204.1 kg)    Current diet: not asked, patient reports working on improving  ------------------------------------------------------------------------  Depression, Follow-up  She  was last seen for this 7 months ago. Changes made at last visit include none.   She reports excellent compliance with treatment. She is not having side effects.   She reports good tolerance of treatment. Current symptoms include: none She feels she is Unchanged since last visit.  ------------------------------------------------------------------------   No Known Allergies   Current Outpatient Medications:  .  amLODipine (NORVASC) 10 MG tablet, TAKE 1 TABLET BY MOUTH EVERY DAY, Disp: 30 tablet, Rfl: 5 .  diclofenac sodium (VOLTAREN) 1 % GEL, Apply 4 g topically 4 (four) times daily as needed., Disp: 100 g, Rfl: 5 .  fluticasone (FLONASE) 50 MCG/ACT nasal spray, Place 2 sprays into both nostrils daily as needed., Disp: , Rfl:  .  lisinopril (ZESTRIL) 10 MG  tablet, TAKE 1 TABLET BY MOUTH EVERY DAY, Disp: 30 tablet, Rfl: 12 .  naproxen (NAPROSYN) 500 MG tablet, TAKE 1 TABLET (500 MG TOTAL) BY MOUTH 2 (TWO) TIMES DAILY AS NEEDED FOR MODERATE PAIN., Disp: 60 tablet, Rfl: 5 .  oxyCODONE-acetaminophen (PERCOCET) 7.5-325 MG tablet, Take 1 tablet by mouth  every 4 (four) hours as needed for severe pain., Disp: 30 tablet, Rfl: 0 .  promethazine (PHENERGAN) 25 MG tablet, Take 1 tablet (25 mg total) by mouth every 8 (eight) hours as needed for nausea or vomiting., Disp: 20 tablet, Rfl: 0 .  sertraline (ZOLOFT) 50 MG tablet, Take 1 tablet (50 mg total) by mouth daily., Disp: 90 tablet, Rfl: 1 .  Semaglutide, 1 MG/DOSE, (OZEMPIC, 1 MG/DOSE,) 2 MG/1.5ML SOPN, Inject 1 mg into the skin once a week. (Patient not taking: Reported on 06/19/2019), Disp: 2 pen, Rfl: 5  Review of Systems  Constitutional: Negative for appetite change, chills, fatigue and fever.  Respiratory: Negative for chest tightness and shortness of breath.   Cardiovascular: Negative for chest pain and palpitations.  Gastrointestinal: Negative for abdominal pain, nausea and vomiting.  Neurological: Negative for dizziness and weakness.    Social History   Tobacco Use  . Smoking status: Never Smoker  . Smokeless tobacco: Never Used  Substance Use Topics  . Alcohol use: Yes    Alcohol/week: 0.0 standard drinks    Comment: occasional use      Objective:   BP 126/64   Pulse 96   Temp (!) 97.1 F (36.2 C) (Oral)   Resp 16   Wt (!) 457 lb 3.2 oz (207.4 kg)   SpO2 97%   BMI 73.79 kg/m  Vitals:   06/19/19 1619  BP: 126/64  Pulse: 96  Resp: 16  Temp: (!) 97.1 F (36.2 C)  TempSrc: Oral  SpO2: 97%  Weight: (!) 457 lb 3.2 oz (207.4 kg)  Body mass index is 73.79 kg/m.   Physical Exam  General appearance: alert, well developed, morbidly obese, cooperative and in no distress Head: Normocephalic, without obvious abnormality, atraumatic Respiratory: Respirations even and unlabored, normal respiratory rate Extremities: All extremities are intact.  Skin: Skin color, texture, turgor normal. No rashes seen  Psych: Appropriate mood and affect. Neurologic: Mental status: Alert, oriented to person, place, and time, thought content appropriate.  Results for orders placed or  performed in visit on 06/19/19  POCT glycosylated hemoglobin (Hb A1C)  Result Value Ref Range   Hemoglobin A1C 6.7 (A) 4.0 - 5.6 %       Assessment & Plan    1. Type 2 diabetes mellitus without complication, without long-term current use of insulin (HCC) Not on Ozempic due to formulary coverage, will start - metFORMIN (GLUCOPHAGE) 500 MG tablet; START ONE TABLET DAILY FOR 30 DAYS, THEN INCREASE TO ONE TABLET THREE TIMES DAILY  Dispense: 90 tablet; Refill: 1  Return to check A1c and weight in 3 months.   2. Morbid obesity (HCC)   3. History of cholecystitis Requires occasionally opioid to control pain.   4. Chronic pain of both knees oxycodone/apap remains effective. Counseled to work on working frequency of dosing.   5. Opioid use  - Pain Mgt Scrn (14 Drugs), Ur  6. Hypertension Well controlled.  Continue current medications.      Mila Merryonald Burnette Valenti, MD  [facility]

## 2019-06-20 ENCOUNTER — Other Ambulatory Visit: Payer: Self-pay | Admitting: Family Medicine

## 2019-06-20 DIAGNOSIS — R1011 Right upper quadrant pain: Secondary | ICD-10-CM

## 2019-06-20 LAB — PAIN MGT SCRN (14 DRUGS), UR
Amphetamine Scrn, Ur: NEGATIVE ng/mL
BARBITURATE SCREEN URINE: NEGATIVE ng/mL
BENZODIAZEPINE SCREEN, URINE: NEGATIVE ng/mL
Buprenorphine, Urine: NEGATIVE ng/mL
CANNABINOIDS UR QL SCN: NEGATIVE ng/mL
Cocaine (Metab) Scrn, Ur: NEGATIVE ng/mL
Creatinine(Crt), U: 118 mg/dL (ref 20.0–300.0)
Fentanyl, Urine: NEGATIVE pg/mL
Meperidine Screen, Urine: NEGATIVE ng/mL
Methadone Screen, Urine: NEGATIVE ng/mL
OXYCODONE+OXYMORPHONE UR QL SCN: POSITIVE ng/mL — AB
Opiate Scrn, Ur: POSITIVE ng/mL — AB
Ph of Urine: 5.8 (ref 4.5–8.9)
Phencyclidine Qn, Ur: NEGATIVE ng/mL
Propoxyphene Scrn, Ur: NEGATIVE ng/mL
Tramadol Screen, Urine: NEGATIVE ng/mL

## 2019-06-20 NOTE — Telephone Encounter (Signed)
Pt needing a refill on: ° °oxyCODONE-acetaminophen (PERCOCET) 7.5-325 MG tablet ° °Please fill at: ° °[id number] [place] [place] - [address] [phone] (Phone) °[phone] (Fax)  ° °Thanks, °[facility] °

## 2019-06-21 MED ORDER — OXYCODONE-ACETAMINOPHEN 7.5-325 MG PO TABS: 1.0000 | ORAL_TABLET | Freq: Four times a day (QID) | ORAL | 0 refills | Status: DC | PRN

## 2019-07-02 ENCOUNTER — Other Ambulatory Visit: Payer: Self-pay | Admitting: Family Medicine

## 2019-07-02 DIAGNOSIS — R1011 Right upper quadrant pain: Secondary | ICD-10-CM

## 2019-07-02 NOTE — Telephone Encounter (Signed)
Please review. KW 

## 2019-07-02 NOTE — Telephone Encounter (Signed)
Pt needs refill Oxycodone 7.5-325  [facility] [address]  [place]

## 2019-07-03 MED ORDER — OXYCODONE-ACETAMINOPHEN 7.5-325 MG PO TABS: 1.0000 | ORAL_TABLET | Freq: Four times a day (QID) | ORAL | 0 refills | Status: DC | PRN

## 2019-07-03 NOTE — Telephone Encounter (Signed)
Pt calling back to let Caryn Section know she needs this filled.  She called yesterday.  Thanks, [employer]

## 2019-07-04 ENCOUNTER — Telehealth: Payer: Self-pay | Admitting: Family Medicine

## 2019-07-04 NOTE — Telephone Encounter (Signed)
Pt has questions on oxyCODONE-acetaminophen (PERCOCET) 7.5-325 MG tablet supply.  Please call pt back.  Thanks, [employer]

## 2019-07-06 NOTE — Telephone Encounter (Signed)
What are her questions?

## 2019-07-06 NOTE — Telephone Encounter (Signed)
[id number] 

## 2019-07-06 NOTE — Telephone Encounter (Signed)
Please review. KW 

## 2019-07-10 NOTE — Telephone Encounter (Signed)
Patient states that she was confused in regards to quanitity of medication. Patient is stating that time before last medication was filled for 30 day supply she stats that recent prescription refill was only for a 8 day supply. Patient states that pharmacy would not let her fill recent prescription because she was given a 30 supply prior to this new refill per patient. Patient wanted to know if we made the error in the first place sending in a 30day supply previously? Please review over chart and advise. KW

## 2019-07-11 NOTE — Telephone Encounter (Signed)
The last two prescriptions were both written as 1 every six hours as needed quantity 30, so it can be refilled after 5 days, not 30. I have no idea why the pharmacy won't dispense the prescription that was sent on 9-15.

## 2019-07-12 NOTE — Telephone Encounter (Signed)
I called pharmacy and was advised by pharmacist that the 1st prescription was entered in their system as a 30 day supply, which caused the 2nd prescription to look like it was too early to fill. Pharmacist is fixing this error and states it will be ready at 5pm today. Patient advised.

## 2019-07-20 ENCOUNTER — Other Ambulatory Visit: Payer: Self-pay | Admitting: Family Medicine

## 2019-07-20 DIAGNOSIS — R1011 Right upper quadrant pain: Secondary | ICD-10-CM

## 2019-07-20 MED ORDER — OXYCODONE-ACETAMINOPHEN 7.5-325 MG PO TABS: 1.0000 | ORAL_TABLET | Freq: Four times a day (QID) | ORAL | 0 refills | Status: DC | PRN

## 2019-07-20 NOTE — Telephone Encounter (Signed)
Please review. Thanks!  

## 2019-07-20 NOTE — Telephone Encounter (Signed)
Pt called needing refill on  Oxycodone 7.5-325  [facility] [address]  [place]

## 2019-07-31 ENCOUNTER — Other Ambulatory Visit: Payer: Self-pay | Admitting: Family Medicine

## 2019-07-31 DIAGNOSIS — R1011 Right upper quadrant pain: Secondary | ICD-10-CM

## 2019-07-31 NOTE — Telephone Encounter (Signed)
Pt needing a refill on:  oxyCODONE-acetaminophen (PERCOCET) 7.5-325 MG tablet  Please fill at:  [id number] [address], [place] [phone] (Phone) [phone] (Fax)   Thanks, [employer]

## 2019-07-31 NOTE — Telephone Encounter (Signed)
Last filled 07/20/19, please review. KW

## 2019-08-02 MED ORDER — OXYCODONE-ACETAMINOPHEN 7.5-325 MG PO TABS: 1.0000 | ORAL_TABLET | Freq: Four times a day (QID) | ORAL | 0 refills | Status: DC | PRN

## 2019-08-02 NOTE — Telephone Encounter (Signed)
Pt called asking if her pain medication has been sent to the pharmacy yet.  I reminder her Dr. Caryn Section is not in on thursdays and it may be tomorrow before it is sent in.  [place]

## 2019-08-08 ENCOUNTER — Other Ambulatory Visit: Payer: Self-pay | Admitting: Family Medicine

## 2019-08-08 DIAGNOSIS — R1011 Right upper quadrant pain: Secondary | ICD-10-CM

## 2019-08-08 NOTE — Telephone Encounter (Signed)
Pt needs a refill on  Oxycodone 7.5-325  [facility]  [address]  [place]

## 2019-08-09 MED ORDER — OXYCODONE-ACETAMINOPHEN 7.5-325 MG PO TABS: 1.0000 | ORAL_TABLET | Freq: Four times a day (QID) | ORAL | 0 refills | Status: DC | PRN

## 2019-08-12 ENCOUNTER — Other Ambulatory Visit: Payer: Self-pay | Admitting: Family Medicine

## 2019-08-12 DIAGNOSIS — E119 Type 2 diabetes mellitus without complications: Secondary | ICD-10-CM

## 2019-08-17 ENCOUNTER — Other Ambulatory Visit: Payer: Self-pay | Admitting: Family Medicine

## 2019-08-17 DIAGNOSIS — F439 Reaction to severe stress, unspecified: Secondary | ICD-10-CM

## 2019-08-20 ENCOUNTER — Other Ambulatory Visit: Payer: Self-pay | Admitting: Family Medicine

## 2019-08-20 DIAGNOSIS — R1011 Right upper quadrant pain: Secondary | ICD-10-CM

## 2019-08-20 NOTE — Telephone Encounter (Signed)
Pt contacted office for refill request on the following medications:  oxyCODONE-acetaminophen (PERCOCET) 7.5-325 MG tablet  [facility] Last Rx: 08/09/2019 LOV: 06/19/2019 Please advise. Thanks TNP

## 2019-08-20 NOTE — Telephone Encounter (Signed)
Last OV 06/19/2019 and last refill 08/09/2019

## 2019-08-21 MED ORDER — OXYCODONE-ACETAMINOPHEN 7.5-325 MG PO TABS: 1.0000 | ORAL_TABLET | Freq: Four times a day (QID) | ORAL | 0 refills | Status: DC | PRN

## 2019-08-31 ENCOUNTER — Other Ambulatory Visit: Payer: Self-pay | Admitting: *Deleted

## 2019-08-31 DIAGNOSIS — R1011 Right upper quadrant pain: Secondary | ICD-10-CM

## 2019-09-02 MED ORDER — OXYCODONE-ACETAMINOPHEN 7.5-325 MG PO TABS: 1.0000 | ORAL_TABLET | Freq: Four times a day (QID) | ORAL | 0 refills | Status: DC | PRN

## 2019-09-11 ENCOUNTER — Other Ambulatory Visit: Payer: Self-pay | Admitting: Family Medicine

## 2019-09-11 DIAGNOSIS — R1011 Right upper quadrant pain: Secondary | ICD-10-CM

## 2019-09-11 NOTE — Telephone Encounter (Signed)
rx refill oxyCODONE-acetaminophen (PERCOCET) 7.5-325 MG tablet  PHARMACY [id number] [address], [place]

## 2019-09-12 MED ORDER — OXYCODONE-ACETAMINOPHEN 7.5-325 MG PO TABS: 1.0000 | ORAL_TABLET | Freq: Four times a day (QID) | ORAL | 0 refills | Status: DC | PRN

## 2019-09-18 ENCOUNTER — Telehealth: Payer: Self-pay | Admitting: Family Medicine

## 2019-09-18 DIAGNOSIS — R1011 Right upper quadrant pain: Secondary | ICD-10-CM

## 2019-09-18 NOTE — Telephone Encounter (Signed)
LOV 06/19/2019 Last RF 09/12/2019

## 2019-09-18 NOTE — Telephone Encounter (Signed)
From [facility] 

## 2019-09-18 NOTE — Telephone Encounter (Signed)
Patient requesting oxyCODONE-acetaminophen (PERCOCET) 7.5-325 MG tablet , informed patient please allow 48 to 72 hour turn around time  [id number] [place], [place]

## 2019-09-21 MED ORDER — OXYCODONE-ACETAMINOPHEN 7.5-325 MG PO TABS: 1.0000 | ORAL_TABLET | Freq: Four times a day (QID) | ORAL | 0 refills | Status: DC | PRN

## 2019-09-24 ENCOUNTER — Ambulatory Visit: Payer: Self-pay | Admitting: Family Medicine

## 2019-09-25 ENCOUNTER — Ambulatory Visit: Payer: 59 | Admitting: Family Medicine

## 2019-09-25 ENCOUNTER — Other Ambulatory Visit: Payer: Self-pay

## 2019-09-25 ENCOUNTER — Encounter: Payer: Self-pay | Admitting: Family Medicine

## 2019-09-25 VITALS — BP 151/79 | HR 109 | Temp 96.8°F | Ht 66.0 in | Wt >= 6400 oz

## 2019-09-25 DIAGNOSIS — R1011 Right upper quadrant pain: Secondary | ICD-10-CM | POA: Diagnosis not present

## 2019-09-25 DIAGNOSIS — E119 Type 2 diabetes mellitus without complications: Secondary | ICD-10-CM | POA: Diagnosis not present

## 2019-09-25 DIAGNOSIS — F1190 Opioid use, unspecified, uncomplicated: Secondary | ICD-10-CM | POA: Diagnosis not present

## 2019-09-25 DIAGNOSIS — Z23 Encounter for immunization: Secondary | ICD-10-CM | POA: Diagnosis not present

## 2019-09-25 DIAGNOSIS — I10 Essential (primary) hypertension: Secondary | ICD-10-CM

## 2019-09-25 MED ORDER — OXYCODONE-ACETAMINOPHEN 7.5-325 MG PO TABS: 1.0000 | ORAL_TABLET | Freq: Four times a day (QID) | ORAL | 0 refills | Status: DC | PRN

## 2019-09-25 NOTE — Progress Notes (Signed)
Patient: Jessica Fields [place] Female    DOB: [birth year]   38 y.o.   MRN: [id number] Visit Date: 09/25/2019  Today's Provider: Mila Merry, MD   Chief Complaint  Patient presents with  . Diabetes  . Weight Check   Subjective:     HPI  Diabetes Mellitus Type II, Follow-up:  Lab Results  Component Value Date   HGBA1C 6.7 (A) 06/19/2019   Last seen for diabetes 3 months ago.  Management since then includes switched from Ozempic to Metformin 500 mg TID due to insurance. She is not having side effects.  Current symptoms include none  Home blood sugar records: not currently being checked  Episodes of hypoglycemia? N/A              Current insulin regiment: Is not on insulin Most Recent Eye Exam: not UTD Weight trend: stable Prior visit with dietician: No Current exercise: 3-4 days a week walking Current diet habits: unhealthy  Pertinent Labs: Lab Results  Component Value Date   CHOL 196 09/29/2018   HDL 51 09/29/2018   LDLCALC 117 (H) 09/29/2018   TRIG 142 09/29/2018   CHOLHDL 3.8 09/29/2018       Wt Readings from Last 3 Encounters:  06/19/19 (!) 457 lb 3.2 oz (207.4 kg)  12/06/18 (!) 456 lb (206.8 kg)  10/23/18 (!) 450 lb (204.1 kg)    ------------------------------------------------------------------------  Follow up for weight:  The patient was last seen for this 3 months ago. Changes made at last visit include no change.  She reports good compliance with treatment. She feels that condition is Worse. She is not having side effects.    Wt Readings from Last 3 Encounters:  09/25/19 (!) 472 lb 6.4 oz (214.3 kg)  06/19/19 (!) 457 lb 3.2 oz (207.4 kg)  12/06/18 (!) 456 lb (206.8 kg)    -----------------------------------------------------------------------------------  No Known Allergies   Current Outpatient Medications:  .  amLODipine (NORVASC) 10 MG tablet, TAKE 1 TABLET BY MOUTH EVERY DAY, Disp: 30 tablet, Rfl: 5 .  diclofenac sodium  (VOLTAREN) 1 % GEL, Apply 4 g topically 4 (four) times daily as needed., Disp: 100 g, Rfl: 5 .  fluticasone (FLONASE) 50 MCG/ACT nasal spray, Place 2 sprays into both nostrils daily as needed., Disp: , Rfl:  .  lisinopril (ZESTRIL) 10 MG tablet, TAKE 1 TABLET BY MOUTH EVERY DAY, Disp: 30 tablet, Rfl: 12 .  metFORMIN (GLUCOPHAGE) 500 MG tablet, Take 1 tablet (500 mg total) by mouth 3 (three) times daily with meals. START ONE TABLET DAILY FOR 30 DAYS, THEN INCREASE TO ONE TABLET THREE TIMES DAILY, Disp: 90 tablet, Rfl: 3 .  naproxen (NAPROSYN) 500 MG tablet, TAKE 1 TABLET (500 MG TOTAL) BY MOUTH 2 (TWO) TIMES DAILY AS NEEDED FOR MODERATE PAIN., Disp: 60 tablet, Rfl: 5 .  oxyCODONE-acetaminophen (PERCOCET) 7.5-325 MG tablet, Take 1 tablet by mouth every 6 (six) hours as needed for severe pain., Disp: 30 tablet, Rfl: 0 .  promethazine (PHENERGAN) 25 MG tablet, Take 1 tablet (25 mg total) by mouth every 8 (eight) hours as needed for nausea or vomiting., Disp: 20 tablet, Rfl: 0 .  sertraline (ZOLOFT) 50 MG tablet, TAKE 1 TABLET BY MOUTH EVERY DAY, Disp: 90 tablet, Rfl: 4  Review of Systems  Constitutional: Negative.   Respiratory: Negative.   Cardiovascular: Negative.   Endocrine: Negative.   Musculoskeletal: Negative.     Social History   Tobacco Use  . Smoking status: Never  Smoker  . Smokeless tobacco: Never Used  Substance Use Topics  . Alcohol use: Yes    Alcohol/week: 0.0 standard drinks    Comment: occasional use      Objective:   BP (!) 151/79 (BP Location: Right Arm, Patient Position: Sitting, Cuff Size: Large)   Pulse (!) 109   Temp (!) 96.8 F (36 C) (Temporal)   Wt (!) 472 lb 6.4 oz (214.3 kg)   SpO2 96%   BMI 76.25 kg/m  Vitals:   09/25/19 1352  BP: (!) 151/79  Pulse: (!) 109  Temp: (!) 96.8 F (36 C)  TempSrc: Temporal  SpO2: 96%  Weight: (!) 472 lb 6.4 oz (214.3 kg)  Body mass index is 76.25 kg/m.   Physical Exam  General appearance: Severely obese female,  cooperative and in no acute distress Head: Normocephalic, without obvious abnormality, atraumatic Respiratory: Respirations even and unlabored, normal respiratory rate Extremities: All extremities are intact.  Skin: Skin color, texture, turgor normal. No rashes seen  Psych: Appropriate mood and affect. Neurologic: Mental status: Alert, oriented to person, place, and time, thought content appropriate.       Assessment & Plan    1. Type 2 diabetes mellitus without complication, without long-term current use of insulin (HCC) Increased weight since last visit. Check labs. She tolerated Ozempic well but not covered by insurance. She is now on new insurance plan and will check coverage. Consider trial of rybelsus with manufacturer coupon - Comprehensive metabolic panel - Lipid Profile - HgB A1c  2. RUQ pain refill - oxyCODONE-acetaminophen (PERCOCET) 7.5-325 MG tablet; Take 1 tablet by mouth every 6 (six) hours as needed for severe pain.  Dispense: 120 tablet; Refill: 0  3. Opioid use   4. Essential hypertension Stable. Continue current medications.    - Flu Vaccine QUAD 6+ mos PF IM (Fluarix Quad PF)  The entirety of the information documented in the History of Present Illness, Review of Systems and Physical Exam were personally obtained by me. Portions of this information were initially documented by Idelle Jo, CMA and reviewed by me for thoroughness and accuracy.     Lelon Huh, MD  [facility]

## 2019-09-25 NOTE — Patient Instructions (Addendum)
.   Please review the attached list of medications and notify my office if there are any errors.   . Please bring all of your medications to every appointment so we can make sure that our medication list is the same as yours.   . Check with insurance to see if Ozempic is covered. If not we can get you samples of Rybelsus and a coupon card from the [facility]

## 2019-09-26 ENCOUNTER — Telehealth: Payer: Self-pay

## 2019-09-26 LAB — COMPREHENSIVE METABOLIC PANEL
ALT: 46 IU/L — ABNORMAL HIGH (ref 0–32)
AST: 29 IU/L (ref 0–40)
Albumin/Globulin Ratio: 1.3 (ref 1.2–2.2)
Albumin: 4 g/dL (ref 3.8–4.8)
Alkaline Phosphatase: 95 IU/L (ref 39–117)
BUN/Creatinine Ratio: 24 — ABNORMAL HIGH (ref 9–23)
BUN: 21 mg/dL (ref 6–24)
Bilirubin Total: 0.3 mg/dL (ref 0.0–1.2)
CO2: 21 mmol/L (ref 20–29)
Calcium: 9.1 mg/dL (ref 8.7–10.2)
Chloride: 98 mmol/L (ref 96–106)
Creatinine, Ser: 0.87 mg/dL (ref 0.57–1.00)
GFR calc Af Amer: 96 mL/min/{1.73_m2} (ref >59)
GFR calc non Af Amer: 84 mL/min/{1.73_m2} (ref >59)
Globulin, Total: 3 g/dL (ref 1.5–4.5)
Glucose: 275 mg/dL — ABNORMAL HIGH (ref 65–99)
Potassium: 4.9 mmol/L (ref 3.5–5.2)
Sodium: 136 mmol/L (ref 134–144)
Total Protein: 7 g/dL (ref 6.0–8.5)

## 2019-09-26 LAB — HEMOGLOBIN A1C
Est. average glucose Bld gHb Est-mCnc: 169 mg/dL
Hgb A1c MFr Bld: 7.5 % — ABNORMAL HIGH (ref 4.8–5.6)

## 2019-09-26 LAB — LIPID PANEL
Chol/HDL Ratio: 3.1 ratio (ref 0.0–4.4)
Cholesterol, Total: 171 mg/dL (ref 100–199)
HDL: 55 mg/dL (ref >39)
LDL Chol Calc (NIH): 85 mg/dL (ref 0–99)
Triglycerides: 182 mg/dL — ABNORMAL HIGH (ref 0–149)
VLDL Cholesterol Cal: 31 mg/dL (ref 5–40)

## 2019-09-26 NOTE — Telephone Encounter (Signed)
-----   Message from Birdie Sons, MD sent at 09/26/2019  7:46 AM EST ----- a1c is up to 7.5. cholesterol is good at 182. She either needs to start back on Ozempic if covered on her insurance, or start samples of Rybelsus. I think she was going to check her insurance then let us know.

## 2019-09-26 NOTE — Telephone Encounter (Signed)
LMTCB, okay for [facility] to advise  

## 2019-10-01 NOTE — Telephone Encounter (Signed)
Patient has been advised she states that she will contact her insurance today about Ozempic and give [place] a call back to let us know. KW

## 2019-10-27 ENCOUNTER — Other Ambulatory Visit: Payer: Self-pay | Admitting: Family Medicine

## 2019-10-27 DIAGNOSIS — I10 Essential (primary) hypertension: Secondary | ICD-10-CM

## 2019-10-28 ENCOUNTER — Other Ambulatory Visit: Payer: Self-pay | Admitting: Family Medicine

## 2019-10-28 DIAGNOSIS — R1011 Right upper quadrant pain: Secondary | ICD-10-CM

## 2019-10-30 ENCOUNTER — Other Ambulatory Visit: Payer: Self-pay | Admitting: Family Medicine

## 2019-10-30 DIAGNOSIS — R1011 Right upper quadrant pain: Secondary | ICD-10-CM

## 2019-10-30 MED ORDER — OXYCODONE-ACETAMINOPHEN 7.5-325 MG PO TABS: 1.0000 | ORAL_TABLET | Freq: Four times a day (QID) | ORAL | 0 refills | Status: DC | PRN

## 2019-11-11 ENCOUNTER — Other Ambulatory Visit: Payer: Self-pay | Admitting: Family Medicine

## 2019-11-11 DIAGNOSIS — E119 Type 2 diabetes mellitus without complications: Secondary | ICD-10-CM

## 2019-11-11 NOTE — Telephone Encounter (Signed)
Requested Prescriptions  Pending Prescriptions Disp Refills  . metFORMIN (GLUCOPHAGE) 500 MG tablet [Pharmacy Med Name: METFORMIN HCL 500 MG TABLET] 270 tablet 1    Sig: TAKE 1 TABLET (500 MG TOTAL) BY MOUTH 3 (THREE) TIMES DAILY WITH MEALS. START ONE TABLET DAILY FOR 30 DAYS, THEN INCREASE TO ONE TABLET THREE TIMES DAILY     Endocrinology:  Diabetes - Biguanides Passed - 11/11/2019 12:42 AM      Passed - Cr in normal range and within 360 days    Creat  Date Value Ref Range Status  07/06/2017 0.96 0.50 - 1.10 mg/dL Final   Creatinine, Ser  Date Value Ref Range Status  09/25/2019 0.87 0.57 - 1.00 mg/dL Final         Passed - HBA1C is between 0 and 7.9 and within 180 days    Hgb A1c MFr Bld  Date Value Ref Range Status  09/25/2019 7.5 (H) 4.8 - 5.6 % Final    Comment:             Prediabetes: 5.7 - 6.4          Diabetes: >6.4          Glycemic control for adults with diabetes: <7.0          Passed - eGFR in normal range and within 360 days    GFR, Est African American  Date Value Ref Range Status  07/06/2017 87 > OR = 60 mL/min/1.10m Final   GFR calc Af Amer  Date Value Ref Range Status  09/25/2019 96 >59 mL/min/1.73 Final   GFR, Est Non African American  Date Value Ref Range Status  07/06/2017 75 > OR = 60 mL/min/1.771mFinal   GFR calc non Af Amer  Date Value Ref Range Status  09/25/2019 84 >59 mL/min/1.73 Final         Passed - Valid encounter within last 6 months    Recent Outpatient Visits          1 month ago Type 2 diabetes mellitus without complication, without long-term current use of insulin ([facility]   4 months ago Type 2 diabetes mellitus without complication, without long-term current use of insulin ([facility]   11 months ago Essential hypertension   [facility]   1 year ago Type 2 diabetes mellitus without complication,  without long-term current use of insulin [facility]   1 year ago Essential hypertension   [facility]      Future Appointments            In 2 months Fisher, DoKirstie PeriMD [facility]

## 2019-11-21 ENCOUNTER — Other Ambulatory Visit: Payer: Self-pay | Admitting: Family Medicine

## 2019-11-21 DIAGNOSIS — M25561 Pain in right knee: Secondary | ICD-10-CM

## 2019-11-21 DIAGNOSIS — G8929 Other chronic pain: Secondary | ICD-10-CM

## 2019-11-21 NOTE — Telephone Encounter (Signed)
Requested Prescriptions  Pending Prescriptions Disp Refills  . naproxen (NAPROSYN) 500 MG tablet [Pharmacy Med Name: NAPROXEN 500 MG TABLET] 60 tablet 5    Sig: TAKE 1 TABLET (500 MG TOTAL) BY MOUTH 2 (TWO) TIMES DAILY AS NEEDED FOR MODERATE PAIN.     Analgesics:  NSAIDS Failed - 11/21/2019  1:38 AM      Failed - HGB in normal range and within 360 days    Hemoglobin  Date Value Ref Range Status  07/06/2017 12.9 11.7 - 15.5 g/dL Final  36/62/9476 54.6 11.1 - 15.9 g/dL Final         Passed - Cr in normal range and within 360 days    Creat  Date Value Ref Range Status  07/06/2017 0.96 0.50 - 1.10 mg/dL Final   Creatinine, Ser  Date Value Ref Range Status  09/25/2019 0.87 0.57 - 1.00 mg/dL Final         Passed - Patient is not pregnant      Passed - Valid encounter within last 12 months    Recent Outpatient Visits          1 month ago Type 2 diabetes mellitus without complication, without long-term current use of insulin (HCC)   [facility] Malva Limes, MD   5 months ago Type 2 diabetes mellitus without complication, without long-term current use of insulin [facility])   [facility] Malva Limes, MD   11 months ago Essential hypertension   [facility] Malva Limes, MD   1 year ago Type 2 diabetes mellitus without complication, without long-term current use of insulin [facility])   [facility] Malva Limes, MD   1 year ago Essential hypertension   [facility] Fisher, Demetrios Isaacs, MD      Future Appointments            In 2 months Fisher, Demetrios Isaacs, MD [facility], [facility]

## 2019-11-26 ENCOUNTER — Other Ambulatory Visit: Payer: Self-pay | Admitting: Family Medicine

## 2019-11-26 DIAGNOSIS — R1011 Right upper quadrant pain: Secondary | ICD-10-CM

## 2019-11-26 NOTE — Telephone Encounter (Signed)
Please advise? Last office visit 09/25/2019.

## 2019-11-27 MED ORDER — OXYCODONE-ACETAMINOPHEN 7.5-325 MG PO TABS: 1.0000 | ORAL_TABLET | Freq: Four times a day (QID) | ORAL | 0 refills | Status: DC | PRN

## 2019-12-22 ENCOUNTER — Other Ambulatory Visit: Payer: Self-pay | Admitting: Family Medicine

## 2019-12-22 DIAGNOSIS — R1011 Right upper quadrant pain: Secondary | ICD-10-CM

## 2019-12-24 MED ORDER — OXYCODONE-ACETAMINOPHEN 7.5-325 MG PO TABS: 1.0000 | ORAL_TABLET | Freq: Four times a day (QID) | ORAL | 0 refills | Status: DC | PRN

## 2020-01-18 ENCOUNTER — Other Ambulatory Visit: Payer: Self-pay | Admitting: Family Medicine

## 2020-01-18 DIAGNOSIS — R1011 Right upper quadrant pain: Secondary | ICD-10-CM

## 2020-01-20 MED ORDER — OXYCODONE-ACETAMINOPHEN 7.5-325 MG PO TABS: 1.0000 | ORAL_TABLET | Freq: Four times a day (QID) | ORAL | 0 refills | Status: DC | PRN

## 2020-01-28 ENCOUNTER — Other Ambulatory Visit: Payer: Self-pay

## 2020-01-28 ENCOUNTER — Encounter: Payer: Self-pay | Admitting: Family Medicine

## 2020-01-28 ENCOUNTER — Ambulatory Visit: Payer: 59 | Admitting: Family Medicine

## 2020-01-28 VITALS — BP 132/86 | HR 109 | Temp 97.1°F | Resp 18 | Wt >= 6400 oz

## 2020-01-28 DIAGNOSIS — E1169 Type 2 diabetes mellitus with other specified complication: Secondary | ICD-10-CM | POA: Insufficient documentation

## 2020-01-28 DIAGNOSIS — E119 Type 2 diabetes mellitus without complications: Secondary | ICD-10-CM | POA: Diagnosis not present

## 2020-01-28 LAB — POCT GLYCOSYLATED HEMOGLOBIN (HGB A1C)
Est. average glucose Bld gHb Est-mCnc: 212
Hemoglobin A1C: 9 % — AB (ref 4.0–5.6)

## 2020-01-28 MED ORDER — RYBELSUS 7 MG PO TABS: 7.0000 mg | ORAL_TABLET | Freq: Every day | ORAL | 0 refills | Status: DC

## 2020-01-28 NOTE — Progress Notes (Signed)
Established patient visit      Patient: Jessica Fields   DOB: 01/02/1981   39 y.o. Female  MRN: [id number] Visit Date: 01/28/2020  Today's healthcare provider: Mila Merry, MD  Subjective:    Chief Complaint  Patient presents with  . Diabetes  . Hypertension   HPI  Diabetes Mellitus Type II, Follow-up:   Lab Results  Component Value Date   HGBA1C 7.5 (H) 09/25/2019   HGBA1C 6.7 (A) 06/19/2019   HGBA1C 6.6 (H) 09/29/2018    Last seen for diabetes 4 months ago.  Management since then includes advising patient to check on insurance coverage for Ozempic. She reports good compliance with treatment. She is not having side effects.  Current symptoms include none  Home blood sugar records: blood sugars are not checked at home  Episodes of hypoglycemia? no   Current insulin regiment: Is not on insulin Most Recent Eye Exam: not UTD Weight trend: stable Prior visit with dietician: No Current exercise: none Current diet habits: well balanced  Pertinent Labs:    Component Value Date/Time   CHOL 171 09/25/2019 1416   TRIG 182 (H) 09/25/2019 1416   HDL 55 09/25/2019 1416   LDLCALC 85 09/25/2019 1416   CREATININE 0.87 09/25/2019 1416   CREATININE 0.96 07/06/2017 1508    Wt Readings from Last 3 Encounters:  01/28/20 (!) 470 lb (213.2 kg)  09/25/19 (!) 472 lb 6.4 oz (214.3 kg)  06/19/19 (!) 457 lb 3.2 oz (207.4 kg)    ------------------------------------------------------------------------  Hypertension, follow-up:  BP Readings from Last 3 Encounters:  01/28/20 132/86  09/25/19 (!) 151/79  06/19/19 126/64    She was last seen for hypertension 4 months ago.  BP at that visit was 151/79. Management since that visit includes no changes. She reports good compliance with treatment. She is not having side effects.  She is not exercising. She is adherent to low salt diet.   Outside blood pressures are 140-150/ 92. She is experiencing none.  Patient denies  chest pain, chest pressure/discomfort, irregular heart beat and palpitations.   Cardiovascular risk factors include diabetes mellitus, hypertension and obesity (BMI >= 30 kg/m2).  Use of agents associated with hypertension: none.     Weight trend: stable Wt Readings from Last 3 Encounters:  01/28/20 (!) 470 lb (213.2 kg)  09/25/19 (!) 472 lb 6.4 oz (214.3 kg)  06/19/19 (!) 457 lb 3.2 oz (207.4 kg)    Current diet: well balanced  ------------------------------------------------------------------------       Medications: Outpatient Medications Prior to Visit  Medication Sig  . amLODipine (NORVASC) 10 MG tablet TAKE 1 TABLET BY MOUTH EVERY DAY  . diclofenac sodium (VOLTAREN) 1 % GEL Apply 4 g topically 4 (four) times daily as needed.  . fluticasone (FLONASE) 50 MCG/ACT nasal spray Place 2 sprays into both nostrils daily as needed.  Marland Kitchen lisinopril (ZESTRIL) 10 MG tablet TAKE 1 TABLET BY MOUTH EVERY DAY  . metFORMIN (GLUCOPHAGE) 500 MG tablet TAKE 1 TABLET (500 MG TOTAL) BY MOUTH 3 (THREE) TIMES DAILY WITH MEALS. START ONE TABLET DAILY FOR 30 DAYS, THEN INCREASE TO ONE TABLET THREE TIMES DAILY  . naproxen (NAPROSYN) 500 MG tablet TAKE 1 TABLET (500 MG TOTAL) BY MOUTH 2 (TWO) TIMES DAILY AS NEEDED FOR MODERATE PAIN.  Marland Kitchen oxyCODONE-acetaminophen (PERCOCET) 7.5-325 MG tablet Take 1 tablet by mouth every 6 (six) hours as needed for severe pain.  . promethazine (PHENERGAN) 25 MG tablet Take 1 tablet (25 mg total) by mouth  every 8 (eight) hours as needed for nausea or vomiting.  . sertraline (ZOLOFT) 50 MG tablet TAKE 1 TABLET BY MOUTH EVERY DAY   No facility-administered medications prior to visit.    Review of Systems  Constitutional: Negative for appetite change, chills, fatigue and fever.  Respiratory: Negative for chest tightness and shortness of breath.   Cardiovascular: Negative for chest pain and palpitations.  Gastrointestinal: Negative for abdominal pain, nausea and vomiting.    Neurological: Negative for dizziness and weakness.        Objective:    BP 132/86 (BP Location: Left Wrist, Patient Position: Sitting, Cuff Size: Large)   Pulse (!) 109   Temp (!) 97.1 F (36.2 C) (Temporal)   Resp 18   Wt (!) 470 lb (213.2 kg)   SpO2 95% Comment: room air  BMI 75.86 kg/m    Physical Exam  General appearance: Severely obese female, cooperative and in no acute distress Head: Normocephalic, without obvious abnormality, atraumatic Respiratory: Respirations even and unlabored, normal respiratory rate Extremities: All extremities are intact.  Skin: Skin color, texture, turgor normal. No rashes seen  Psych: Appropriate mood and affect. Neurologic: Mental status: Alert, oriented to person, place, and time, thought content appropriate.   Results for orders placed or performed in visit on 01/28/20  POCT HgB A1C  Result Value Ref Range   Hemoglobin A1C 9.0 (A) 4.0 - 5.6 %   Est. average glucose Bld gHb Est-mCnc 212       Assessment & Plan:    1. Type 2 diabetes mellitus without complication, without long-term current use of insulin ([place]) She did well with Ozempic in the past, but was too expensive. Given 30 day sample pack to start Semaglutide (RYBELSUS) 3 MG TABS; Take 3 mg by mouth daily.  Dispense: 30 tablet; Refill: 0  Given printed prescription with $10 copay coupon for 30 days of 7mg  Rybelsus to take after finishing starting sample pack.   2. Morbid obesity ([place]) Expect improvement with semaglutide.    The entirety of the information documented in the History of Present Illness, Review of Systems and Physical Exam were personally obtained by me. Portions of this information were initially documented by Meyer Cory, CMA and reviewed by me for thoroughness and accuracy.    Follow up 2 months.   Lelon Huh, MD  [facility] [phone] (phone) [phone] (fax)  [place]

## 2020-01-28 NOTE — Patient Instructions (Addendum)
.   Start samples of Rybelsus, 3mg  once a day for 30 days, then start prescription Rybelsus, 7mg  once a day for 30 days.    Call if you have any trouble filling prescription

## 2020-02-14 ENCOUNTER — Other Ambulatory Visit: Payer: Self-pay | Admitting: Family Medicine

## 2020-02-14 DIAGNOSIS — R1011 Right upper quadrant pain: Secondary | ICD-10-CM

## 2020-02-15 ENCOUNTER — Other Ambulatory Visit: Payer: Self-pay | Admitting: Family Medicine

## 2020-02-15 DIAGNOSIS — R1011 Right upper quadrant pain: Secondary | ICD-10-CM

## 2020-02-15 MED ORDER — OXYCODONE-ACETAMINOPHEN 7.5-325 MG PO TABS: 1.0000 | ORAL_TABLET | Freq: Four times a day (QID) | ORAL | 0 refills | Status: DC | PRN

## 2020-02-15 NOTE — Telephone Encounter (Signed)
Last dispensed 30 days supply on 01-21-2020

## 2020-03-14 ENCOUNTER — Other Ambulatory Visit: Payer: Self-pay | Admitting: Family Medicine

## 2020-03-14 DIAGNOSIS — E119 Type 2 diabetes mellitus without complications: Secondary | ICD-10-CM

## 2020-03-14 DIAGNOSIS — R1011 Right upper quadrant pain: Secondary | ICD-10-CM

## 2020-03-14 MED ORDER — RYBELSUS 7 MG PO TABS: 7.0000 mg | ORAL_TABLET | Freq: Every day | ORAL | 0 refills | Status: DC

## 2020-03-14 NOTE — Telephone Encounter (Signed)
30 day dispense 01-18-2020

## 2020-03-15 MED ORDER — OXYCODONE-ACETAMINOPHEN 7.5-325 MG PO TABS: 1.0000 | ORAL_TABLET | Freq: Four times a day (QID) | ORAL | 0 refills | Status: DC | PRN

## 2020-03-24 ENCOUNTER — Encounter: Payer: Self-pay | Admitting: Family Medicine

## 2020-03-24 ENCOUNTER — Ambulatory Visit (INDEPENDENT_AMBULATORY_CARE_PROVIDER_SITE_OTHER): Payer: 59 | Admitting: Family Medicine

## 2020-03-24 ENCOUNTER — Other Ambulatory Visit: Payer: Self-pay

## 2020-03-24 VITALS — BP 138/86 | HR 116 | Temp 97.5°F | Wt >= 6400 oz

## 2020-03-24 DIAGNOSIS — E119 Type 2 diabetes mellitus without complications: Secondary | ICD-10-CM

## 2020-03-24 LAB — POCT GLYCOSYLATED HEMOGLOBIN (HGB A1C)
Est. average glucose Bld gHb Est-mCnc: 263
Hemoglobin A1C: 10.8 % — AB (ref 4.0–5.6)

## 2020-03-24 MED ORDER — RYBELSUS 7 MG PO TABS: 7.0000 mg | ORAL_TABLET | Freq: Every day | ORAL | 3 refills | Status: DC

## 2020-03-24 MED ORDER — OZEMPIC (0.25 OR 0.5 MG/DOSE) 2 MG/1.5ML ~~LOC~~ SOPN: 0.5000 mg | PEN_INJECTOR | SUBCUTANEOUS | 3 refills | Status: DC

## 2020-03-24 NOTE — Progress Notes (Addendum)
I,Roshena L Chambers,acting as a scribe for Lelon Huh, MD.,have documented all relevant documentation on the behalf of Lelon Huh, MD,as directed by  Lelon Huh, MD while in the presence of Lelon Huh, MD.  Established patient visit   Patient: Jessica Fields   DOB: 1981-08-04   39 y.o. Female  MRN: [id number] Visit Date: 03/24/2020  Today's healthcare provider: Lelon Huh, MD   Chief Complaint  Patient presents with  . Diabetes   Subjective    HPI Diabetes Mellitus Type II, Follow-up  Lab Results  Component Value Date   HGBA1C 9.0 (A) 01/28/2020   HGBA1C 7.5 (H) 09/25/2019   HGBA1C 6.7 (A) 06/19/2019   Wt Readings from Last 3 Encounters:  03/24/20 (!) 461 lb (209.1 kg)  01/28/20 (!) 470 lb (213.2 kg)  09/25/19 (!) 472 lb 6.4 oz (214.3 kg)   Last seen for diabetes 2 months ago.  Management since then includes starting samples of Rybelsus, 3mg  once a day for 30 days, then started prescription  Rybelsus, 7mg  once a day for 30 days. She erroneously thought she as suppose to stop metformin when she started Rybelsus.  She reports good compliance with treatment. Has been out of medication for the past 3-4 days. She is not having side effects.  Symptoms: No fatigue No foot ulcerations  No appetite changes No nausea  No paresthesia of the feet  No polydipsia  Yes polyuria No visual disturbances   No vomiting     Home blood sugar records: blood sugars are not checked at home  Episodes of hypoglycemia? No    Current insulin regiment: none Most Recent Eye Exam: not UTD Current exercise: none Current diet habits: well balanced  Pertinent Labs: Lab Results  Component Value Date   CHOL 171 09/25/2019   HDL 55 09/25/2019   LDLCALC 85 09/25/2019   TRIG 182 (H) 09/25/2019   CHOLHDL 3.1 09/25/2019   Lab Results  Component Value Date   NA 136 09/25/2019   K 4.9 09/25/2019   CREATININE 0.87 09/25/2019   GFRNONAA 84 09/25/2019   GFRAA 96 09/25/2019    GLUCOSE 275 (H) 09/25/2019     ---------------------------------------------------------------------------------------------------    Medications: Outpatient Medications Prior to Visit  Medication Sig  . amLODipine (NORVASC) 10 MG tablet TAKE 1 TABLET BY MOUTH EVERY DAY  . diclofenac sodium (VOLTAREN) 1 % GEL Apply 4 g topically 4 (four) times daily as needed.  . fluticasone (FLONASE) 50 MCG/ACT nasal spray Place 2 sprays into both nostrils daily as needed.  Marland Kitchen lisinopril (ZESTRIL) 10 MG tablet TAKE 1 TABLET BY MOUTH EVERY DAY  . metFORMIN (GLUCOPHAGE) 500 MG tablet TAKE 1 TABLET (500 MG TOTAL) BY MOUTH 3 (THREE) TIMES DAILY WITH MEALS. START ONE TABLET DAILY FOR 30 DAYS, THEN INCREASE TO ONE TABLET THREE TIMES DAILY  . naproxen (NAPROSYN) 500 MG tablet TAKE 1 TABLET (500 MG TOTAL) BY MOUTH 2 (TWO) TIMES DAILY AS NEEDED FOR MODERATE PAIN.  Marland Kitchen oxyCODONE-acetaminophen (PERCOCET) 7.5-325 MG tablet Take 1 tablet by mouth every 6 (six) hours as needed for severe pain.  . promethazine (PHENERGAN) 25 MG tablet Take 1 tablet (25 mg total) by mouth every 8 (eight) hours as needed for nausea or vomiting.  . Semaglutide (RYBELSUS) 7 MG TABS Take 7 mg by mouth daily.  . sertraline (ZOLOFT) 50 MG tablet TAKE 1 TABLET BY MOUTH EVERY DAY   No facility-administered medications prior to visit.    Review of Systems  Constitutional: Negative for  appetite change, chills, fatigue and fever.  Respiratory: Negative for chest tightness and shortness of breath.   Cardiovascular: Negative for chest pain and palpitations.  Gastrointestinal: Negative for abdominal pain, nausea and vomiting.  Genitourinary: Positive for frequency.  Neurological: Negative for dizziness and weakness.     Objective    BP 138/86 (BP Location: Left Wrist, Patient Position: Sitting, Cuff Size: Large)   Pulse (!) 116   Temp (!) 97.5 F (36.4 C) (Temporal)   Wt (!) 461 lb (209.1 kg)   SpO2 96% Comment: room air  BMI 74.41 kg/m    Physical Exam  General appearance: Severely obese female, cooperative and in no acute distress Head: Normocephalic, without obvious abnormality, atraumatic Respiratory: Respirations even and unlabored, normal respiratory rate Extremities: All extremities are intact.  Skin: Skin color, texture, turgor normal. No rashes seen  Psych: Appropriate mood and affect. Neurologic: Mental status: Alert, oriented to person, place, and time, thought content appropriate.   Results for orders placed or performed in visit on 03/24/20  POCT HgB A1C  Result Value Ref Range   Hemoglobin A1C 10.8 (A) 4.0 - 5.6 %   Est. average glucose Bld gHb Est-mCnc 263     Assessment & Plan     1. Type 2 diabetes mellitus without complication, without long-term current use of insulin (HCC) She is tolerating oral semaglutide well, but mistakenly stopped metformin since last visit which she is to restart. She states her insurance now covers injectable weekly semaglutide which she has taken samples of in the past and did well, so we will switch to the injectable formulation.   2. Morbid obesity (HCC) Expect significant weight loss with injectabl semaglutide. Will likely titrate to higher dose if tolerating at follow up.       The entirety of the information documented in the History of Present Illness, Review of Systems and Physical Exam were personally obtained by me. Portions of this information were initially documented by the CMA and reviewed by me for thoroughness and accuracy.      Mila Merry, MD  [facility] [phone] (phone) [phone] (fax)  [facility]

## 2020-03-24 NOTE — Patient Instructions (Addendum)
.   Please review the attached list of medications and notify my office if there are any errors.   . Go ahead and start back on the metformin   Take 0.25mg  Ozempic as soon as you are able to get the prescription filled. Then you can start taking 0.5mg  every Sunday

## 2020-04-11 ENCOUNTER — Other Ambulatory Visit: Payer: Self-pay | Admitting: Family Medicine

## 2020-04-11 DIAGNOSIS — R1011 Right upper quadrant pain: Secondary | ICD-10-CM

## 2020-04-11 MED ORDER — OXYCODONE-ACETAMINOPHEN 7.5-325 MG PO TABS: 1.0000 | ORAL_TABLET | Freq: Four times a day (QID) | ORAL | 0 refills | Status: DC | PRN

## 2020-05-08 ENCOUNTER — Other Ambulatory Visit: Payer: Self-pay | Admitting: Family Medicine

## 2020-05-08 DIAGNOSIS — R1011 Right upper quadrant pain: Secondary | ICD-10-CM

## 2020-05-09 ENCOUNTER — Other Ambulatory Visit: Payer: Self-pay | Admitting: Family Medicine

## 2020-05-09 DIAGNOSIS — R1011 Right upper quadrant pain: Secondary | ICD-10-CM

## 2020-05-11 MED ORDER — OXYCODONE-ACETAMINOPHEN 7.5-325 MG PO TABS: 1.0000 | ORAL_TABLET | Freq: Four times a day (QID) | ORAL | 0 refills | Status: DC | PRN

## 2020-06-02 ENCOUNTER — Telehealth: Payer: Self-pay

## 2020-06-02 NOTE — Telephone Encounter (Signed)
Copied from CRM [id number]. Topic: General - Inquiry >> Jun 02, 2020  1:46 PM Jessica Fields wrote: Reason for CRM: Patient needs a prior authorization for ozempic. Please contact the pharmacy when it is approved

## 2020-06-04 NOTE — Telephone Encounter (Signed)
[facility] should be faxing PA over per request.

## 2020-06-06 ENCOUNTER — Other Ambulatory Visit: Payer: Self-pay | Admitting: Family Medicine

## 2020-06-06 DIAGNOSIS — E119 Type 2 diabetes mellitus without complications: Secondary | ICD-10-CM

## 2020-06-06 MED ORDER — OZEMPIC (0.25 OR 0.5 MG/DOSE) 2 MG/1.5ML ~~LOC~~ SOPN: 0.5000 mg | PEN_INJECTOR | SUBCUTANEOUS | 6 refills | Status: DC

## 2020-06-07 ENCOUNTER — Other Ambulatory Visit: Payer: Self-pay | Admitting: Family Medicine

## 2020-06-07 DIAGNOSIS — G8929 Other chronic pain: Secondary | ICD-10-CM

## 2020-06-07 DIAGNOSIS — M25562 Pain in left knee: Secondary | ICD-10-CM

## 2020-06-07 NOTE — Telephone Encounter (Signed)
Requested medication (s) are due for refill today: yes  Requested medication (s) are on the active medication list: yes  Last refill: 11/21/19  #60  0 refills  Future visit scheduled: yes  Notes to clinic:  HGB last checked 2018    Requested Prescriptions  Pending Prescriptions Disp Refills   naproxen (NAPROSYN) 500 MG tablet [Pharmacy Med Name: NAPROXEN 500 MG TABLET] 60 tablet 5    Sig: TAKE 1 TABLET (500 MG TOTAL) BY MOUTH 2 (TWO) TIMES DAILY AS NEEDED FOR MODERATE PAIN.      Analgesics:  NSAIDS Failed - 06/07/2020 12:44 AM      Failed - HGB in normal range and within 360 days    Hemoglobin  Date Value Ref Range Status  07/06/2017 12.9 11.7 - 15.5 g/dL Final  47/82/9562 13.0 11.1 - 15.9 g/dL Final          Passed - Cr in normal range and within 360 days    Creat  Date Value Ref Range Status  07/06/2017 0.96 0.50 - 1.10 mg/dL Final   Creatinine, Ser  Date Value Ref Range Status  09/25/2019 0.87 0.57 - 1.00 mg/dL Final          Passed - Patient is not pregnant      Passed - Valid encounter within last 12 months    Recent Outpatient Visits           2 months ago Type 2 diabetes mellitus without complication, without long-term current use of insulin (HCC)   [facility] Malva Limes, MD   4 months ago Type 2 diabetes mellitus without complication, without long-term current use of insulin (HCC)   [facility] Malva Limes, MD   8 months ago Type 2 diabetes mellitus without complication, without long-term current use of insulin [facility])   [facility] Malva Limes, MD   11 months ago Type 2 diabetes mellitus without complication, without long-term current use of insulin [facility])   [facility] Malva Limes, MD   1 year ago Essential hypertension   [facility] Fisher, Demetrios Isaacs, MD       Future Appointments             In 3 weeks Fisher, Demetrios Isaacs, MD [facility]

## 2020-06-08 ENCOUNTER — Other Ambulatory Visit: Payer: Self-pay | Admitting: Family Medicine

## 2020-06-08 DIAGNOSIS — R1011 Right upper quadrant pain: Secondary | ICD-10-CM

## 2020-06-09 MED ORDER — OXYCODONE-ACETAMINOPHEN 7.5-325 MG PO TABS: 1.0000 | ORAL_TABLET | Freq: Four times a day (QID) | ORAL | 0 refills | Status: DC | PRN

## 2020-06-30 ENCOUNTER — Telehealth: Payer: Self-pay

## 2020-06-30 ENCOUNTER — Ambulatory Visit: Payer: Self-pay | Admitting: Family Medicine

## 2020-06-30 NOTE — Telephone Encounter (Signed)
Copied from CRM [id number]. Topic: Appointment Scheduling - Scheduling Inquiry for Clinic >> Jun 30, 2020  1:41 PM Leafy Ro wrote: Reason for CRM:Pt has been exposed to covid her coworker tested positive today. Pt will take home covid test today

## 2020-07-08 ENCOUNTER — Other Ambulatory Visit: Payer: Self-pay | Admitting: Family Medicine

## 2020-07-08 DIAGNOSIS — R1011 Right upper quadrant pain: Secondary | ICD-10-CM

## 2020-07-09 ENCOUNTER — Other Ambulatory Visit: Payer: Self-pay | Admitting: Family Medicine

## 2020-07-09 ENCOUNTER — Encounter: Payer: Self-pay | Admitting: Family Medicine

## 2020-07-09 DIAGNOSIS — R1011 Right upper quadrant pain: Secondary | ICD-10-CM

## 2020-07-09 DIAGNOSIS — E119 Type 2 diabetes mellitus without complications: Secondary | ICD-10-CM

## 2020-07-09 MED ORDER — OXYCODONE-ACETAMINOPHEN 7.5-325 MG PO TABS: 1.0000 | ORAL_TABLET | Freq: Four times a day (QID) | ORAL | 0 refills | Status: DC | PRN

## 2020-07-11 MED ORDER — METFORMIN HCL 500 MG PO TABS: 500.0000 mg | ORAL_TABLET | Freq: Three times a day (TID) | ORAL | 4 refills | Status: DC

## 2020-07-23 ENCOUNTER — Telehealth: Payer: Self-pay | Admitting: Family Medicine

## 2020-07-23 NOTE — Telephone Encounter (Signed)
Patient missed her follow up for uncontrolled diabetes 06-30-2020 and has not rescheduled. She needs to reschedule this. We cannot refill any of her medications until she is seen for diabetes follow up.

## 2020-08-07 ENCOUNTER — Other Ambulatory Visit: Payer: Self-pay | Admitting: Family Medicine

## 2020-08-07 ENCOUNTER — Other Ambulatory Visit: Payer: Self-pay

## 2020-08-07 DIAGNOSIS — R1011 Right upper quadrant pain: Secondary | ICD-10-CM

## 2020-08-07 MED ORDER — OXYCODONE-ACETAMINOPHEN 7.5-325 MG PO TABS: 1.0000 | ORAL_TABLET | Freq: Four times a day (QID) | ORAL | 0 refills | Status: DC | PRN

## 2020-08-07 NOTE — Telephone Encounter (Signed)
Copied from CRM [id number]. Topic: General - Other >> Aug 07, 2020  1:37 PM Dalphine Handing A wrote: Patient would like a callback from nurse in regards to medication request being denied due to patient needing an appt. Patient stated that she is already scheduled for an appointment and would like a supply of medication sent to pharmacy to hold her until her appointment. Please advise

## 2020-08-19 ENCOUNTER — Ambulatory Visit: Payer: 59 | Admitting: Family Medicine

## 2020-08-19 ENCOUNTER — Other Ambulatory Visit: Payer: Self-pay

## 2020-08-19 DIAGNOSIS — K811 Chronic cholecystitis: Secondary | ICD-10-CM | POA: Diagnosis not present

## 2020-08-19 DIAGNOSIS — E1169 Type 2 diabetes mellitus with other specified complication: Secondary | ICD-10-CM | POA: Diagnosis not present

## 2020-08-19 DIAGNOSIS — Z23 Encounter for immunization: Secondary | ICD-10-CM

## 2020-08-19 DIAGNOSIS — F1190 Opioid use, unspecified, uncomplicated: Secondary | ICD-10-CM

## 2020-08-19 LAB — POCT GLYCOSYLATED HEMOGLOBIN (HGB A1C)
Est. average glucose Bld gHb Est-mCnc: 146
Hemoglobin A1C: 6.7 % — AB (ref 4.0–5.6)

## 2020-08-19 MED ORDER — OZEMPIC (0.25 OR 0.5 MG/DOSE) 2 MG/1.5ML ~~LOC~~ SOPN: PEN_INJECTOR | SUBCUTANEOUS | 0 refills | Status: DC

## 2020-08-19 NOTE — Patient Instructions (Signed)
.   Please review the attached list of medications and notify my office if there are any errors.   . Please bring all of your medications to every appointment so we can make sure that our medication list is the same as yours.   

## 2020-08-19 NOTE — Progress Notes (Signed)
Established patient visit   Patient: Jessica Fields   DOB: 05/21/1981   39 y.o. Female  MRN: [id number] Visit Date: 08/19/2020  Today's healthcare provider: Mila Merry, MD   Chief Complaint  Patient presents with  . Diabetes  . Hypertension  . Pain Management   Subjective    HPI  Diabetes Mellitus Type II, Follow-up  Lab Results  Component Value Date   HGBA1C 6.7 (A) 08/19/2020   HGBA1C 10.8 (A) 03/24/2020   HGBA1C 9.0 (A) 01/28/2020   Wt Readings from Last 3 Encounters:  03/24/20 (!) 461 lb (209.1 kg)  01/28/20 (!) 470 lb (213.2 kg)  09/25/19 (!) 472 lb 6.4 oz (214.3 kg)   Last seen for diabetes 03/24/2020.   From that visit, it was noted that she was tolerating oral semaglutide well, but mistakenly stopped metformin since last visit which she is to restart. She stated that her insurance does cover injectable weekly semaglutide which she has taken samples of in the past and did well, so we switched to the injectable formulation.  She reports fair compliance with treatment. Patient states her insurance would not cover Ozempic after the 3rd month.  She is not having side effects.  Symptoms: Yes fatigue No foot ulcerations  No appetite changes No nausea  No paresthesia of the feet  No polydipsia  No polyuria No visual disturbances   No vomiting     Home blood sugar records: fasting range: upper 100's  Episodes of hypoglycemia? No    Current insulin regiment: none; takes Ozempic and Metformin Most Recent Eye Exam: not UTD Current exercise: walking Current diet habits: well balanced  Pertinent Labs: Lab Results  Component Value Date   CHOL 171 09/25/2019   HDL 55 09/25/2019   LDLCALC 85 09/25/2019   TRIG 182 (H) 09/25/2019   CHOLHDL 3.1 09/25/2019   Lab Results  Component Value Date   NA 136 09/25/2019   K 4.9 09/25/2019   CREATININE 0.87 09/25/2019   GFRNONAA 84 09/25/2019   GFRAA 96 09/25/2019   GLUCOSE 275 (H) 09/25/2019      ---------------------------------------------------------------------------------------------------  Follow up for morbid obesity:  The patient was last seen for this 5 months ago. Changes made at last visit include starting injectable semaglutide.  She reports fair compliance with treatment. Patients insurance would not cover Ozempic. She feels that condition is Improved. She is not having side effects.   -----------------------------------------------------------------------------------------  Hypertension, follow-up  BP Readings from Last 3 Encounters:  08/19/20 (!) 152/80  03/24/20 138/86  01/28/20 132/86   Wt Readings from Last 3 Encounters:  03/24/20 (!) 461 lb (209.1 kg)  01/28/20 (!) 470 lb (213.2 kg)  09/25/19 (!) 472 lb 6.4 oz (214.3 kg)     She was last seen for hypertension 11 months ago.  BP at that visit was 151/79. Management since that visit includes continue same medication.  She reports good compliance with treatment. She is not having side effects.  She is following a Regular diet. She is exercising. She does not smoke.  Use of agents associated with hypertension: none.   Outside blood pressures are checked occasionally. Symptoms: No chest pain No chest pressure  No palpitations No syncope  No dyspnea No orthopnea  No paroxysmal nocturnal dyspnea No lower extremity edema   Pertinent labs: Lab Results  Component Value Date   CHOL 171 09/25/2019   HDL 55 09/25/2019   LDLCALC 85 09/25/2019   TRIG 182 (H) 09/25/2019   CHOLHDL  3.1 09/25/2019   Lab Results  Component Value Date   NA 136 09/25/2019   K 4.9 09/25/2019   CREATININE 0.87 09/25/2019   GFRNONAA 84 09/25/2019   GFRAA 96 09/25/2019   GLUCOSE 275 (H) 09/25/2019     The 10-year ASCVD risk score Denman George DC Jr., et al., 2013) is: 1.7%   ---------------------------------------------------------------------------------------------------  Follow up for chronic pain:  The patient was  last seen for this 11 months ago. Changes made at last visit include continuing the same medications She reports good compliance with treatment. She feels that condition is Unchanged. She is not having side effects.  She has chronic RUQ pain due to gallstones and was not a candidate for elective cholecystectomy due to morbid obesity. She takes oxycodone/apap 7.5/325 four times every day which has been effective for her. Pain becomes much more severe when she skips a dose.  -----------------------------------------------------------------------------------------       Medications: Outpatient Medications Prior to Visit  Medication Sig  . naproxen (NAPROSYN) 500 MG tablet TAKE 1 TABLET (500 MG TOTAL) BY MOUTH 2 (TWO) TIMES DAILY AS NEEDED FOR MODERATE PAIN.  Marland Kitchen amLODipine (NORVASC) 10 MG tablet TAKE 1 TABLET BY MOUTH EVERY DAY  . diclofenac sodium (VOLTAREN) 1 % GEL Apply 4 g topically 4 (four) times daily as needed.  . fluticasone (FLONASE) 50 MCG/ACT nasal spray Place 2 sprays into both nostrils daily as needed.  Marland Kitchen lisinopril (ZESTRIL) 10 MG tablet TAKE 1 TABLET BY MOUTH EVERY DAY  . metFORMIN (GLUCOPHAGE) 500 MG tablet Take 1 tablet (500 mg total) by mouth 3 (three) times daily with meals.  Marland Kitchen oxyCODONE-acetaminophen (PERCOCET) 7.5-325 MG tablet Take 1 tablet by mouth every 6 (six) hours as needed for severe pain.  . promethazine (PHENERGAN) 25 MG tablet Take 1 tablet (25 mg total) by mouth every 8 (eight) hours as needed for nausea or vomiting.  . Semaglutide,0.25 or 0.5MG /DOS, (OZEMPIC, 0.25 OR 0.5 MG/DOSE,) 2 MG/1.5ML SOPN Inject 0.375 mLs (0.5 mg total) into the skin once a week.  . sertraline (ZOLOFT) 50 MG tablet TAKE 1 TABLET BY MOUTH EVERY DAY   No facility-administered medications prior to visit.    Review of Systems  Constitutional: Negative for appetite change, chills, fatigue and fever.  Respiratory: Negative for chest tightness and shortness of breath.   Cardiovascular:  Negative for chest pain and palpitations.  Gastrointestinal: Negative for abdominal pain, nausea and vomiting.  Neurological: Negative for dizziness and weakness.      Objective    BP (!) 152/80 (BP Location: Right Wrist, Patient Position: Sitting, Cuff Size: Large)   Pulse 85    Physical Exam  General appearance: Severely obese female, cooperative and in no acute distress Head: Normocephalic, without obvious abnormality, atraumatic Respiratory: Respirations even and unlabored, normal respiratory rate Extremities: All extremities are intact.  Skin: Skin color, texture, turgor normal. No rashes seen  Psych: Appropriate mood and affect. Neurologic: Mental status: Alert, oriented to person, place, and time, thought content appropriate.   Results for orders placed or performed in visit on 08/19/20  POCT HgB A1C  Result Value Ref Range   Hemoglobin A1C 6.7 (A) 4.0 - 5.6 %   Est. average glucose Bld gHb Est-mCnc 146     Assessment & Plan     1. Type 2 diabetes mellitus with morbid obesity (HCC) A1c is very good, but has not been out of Ozempic for several weeks and home blood sugar going back up. A1c was controlled on monotherapy with  metformin. Will start back on low dose- Semaglutide,0.25 or 0.5MG /DOS, (OZEMPIC, 0.25 OR 0.5 MG/DOSE,) 2 MG/1.5ML SOPN; Inject 0.25mg  once a week for 2-3 weeks, then increase to 0.5mg  once a week.  Dispense: 1.5 mL; Refill: 0. Will contact pharmacy in a few days if we don't hear about required PA.   2. Chronic, continuous use of opioids  - Pain Mgt Scrn (14 Drugs), Ur  3. Chronic cholecystitis  - Pain Mgt Scrn (14 Drugs), Ur  4. Morbid obesity (HCC) Has lost weight on Ozempic, but stating to gain again since being off medication due to insurance denial.   5. Need for influenza vaccination  - Flu Vaccine QUAD 36+ mos IM (Fluarix/Fluzone)       The entirety of the information documented in the History of Present Illness, Review of Systems and  Physical Exam were personally obtained by me. Portions of this information were initially documented by the CMA and reviewed by me for thoroughness and accuracy.      Mila Merry, MD  [facility] [phone] (phone) [phone] (fax)  [facility]

## 2020-08-20 ENCOUNTER — Other Ambulatory Visit: Payer: Self-pay | Admitting: Family Medicine

## 2020-08-20 DIAGNOSIS — R1011 Right upper quadrant pain: Secondary | ICD-10-CM

## 2020-08-20 MED ORDER — OXYCODONE-ACETAMINOPHEN 7.5-325 MG PO TABS: 1.0000 | ORAL_TABLET | Freq: Four times a day (QID) | ORAL | 0 refills | Status: DC | PRN

## 2020-08-20 NOTE — Telephone Encounter (Signed)
Please review. Thanks!  

## 2020-08-21 ENCOUNTER — Other Ambulatory Visit: Payer: Self-pay | Admitting: Family Medicine

## 2020-08-21 DIAGNOSIS — I10 Essential (primary) hypertension: Secondary | ICD-10-CM

## 2020-08-21 DIAGNOSIS — F439 Reaction to severe stress, unspecified: Secondary | ICD-10-CM

## 2020-08-21 LAB — PAIN MGT SCRN (14 DRUGS), UR
Amphetamine Scrn, Ur: NEGATIVE ng/mL
BARBITURATE SCREEN URINE: NEGATIVE ng/mL
BENZODIAZEPINE SCREEN, URINE: NEGATIVE ng/mL
Buprenorphine, Urine: NEGATIVE ng/mL
CANNABINOIDS UR QL SCN: NEGATIVE ng/mL
Cocaine (Metab) Scrn, Ur: NEGATIVE ng/mL
Creatinine(Crt), U: 172.9 mg/dL (ref 20.0–300.0)
Fentanyl, Urine: NEGATIVE pg/mL
Meperidine Screen, Urine: NEGATIVE ng/mL
Methadone Screen, Urine: NEGATIVE ng/mL
OXYCODONE+OXYMORPHONE UR QL SCN: POSITIVE ng/mL — AB
Opiate Scrn, Ur: POSITIVE ng/mL — AB
Ph of Urine: 5.6 (ref 4.5–8.9)
Phencyclidine Qn, Ur: NEGATIVE ng/mL
Propoxyphene Scrn, Ur: NEGATIVE ng/mL
Tramadol Screen, Urine: NEGATIVE ng/mL

## 2020-08-25 ENCOUNTER — Encounter: Payer: Self-pay | Admitting: Family Medicine

## 2020-08-28 ENCOUNTER — Other Ambulatory Visit: Payer: Self-pay | Admitting: Family Medicine

## 2020-08-28 DIAGNOSIS — R1011 Right upper quadrant pain: Secondary | ICD-10-CM

## 2020-08-28 MED ORDER — OXYCODONE-ACETAMINOPHEN 7.5-325 MG PO TABS: 1.0000 | ORAL_TABLET | Freq: Four times a day (QID) | ORAL | 0 refills | Status: DC | PRN

## 2020-09-04 ENCOUNTER — Other Ambulatory Visit: Payer: Self-pay | Admitting: Family Medicine

## 2020-09-04 DIAGNOSIS — R1011 Right upper quadrant pain: Secondary | ICD-10-CM

## 2020-09-05 MED ORDER — OXYCODONE-ACETAMINOPHEN 7.5-325 MG PO TABS: 1.0000 | ORAL_TABLET | Freq: Four times a day (QID) | ORAL | 0 refills | Status: DC | PRN

## 2020-09-18 ENCOUNTER — Other Ambulatory Visit: Payer: Self-pay | Admitting: Family Medicine

## 2020-09-18 DIAGNOSIS — R1011 Right upper quadrant pain: Secondary | ICD-10-CM

## 2020-09-22 MED ORDER — OXYCODONE-ACETAMINOPHEN 7.5-325 MG PO TABS: 1.0000 | ORAL_TABLET | Freq: Four times a day (QID) | ORAL | 0 refills | Status: DC | PRN

## 2020-10-01 ENCOUNTER — Other Ambulatory Visit: Payer: Self-pay | Admitting: Family Medicine

## 2020-10-01 DIAGNOSIS — R1011 Right upper quadrant pain: Secondary | ICD-10-CM

## 2020-10-01 NOTE — Telephone Encounter (Signed)
Please review. Requesting refills. Thanks!  

## 2020-10-06 MED ORDER — OXYCODONE-ACETAMINOPHEN 7.5-325 MG PO TABS: 1.0000 | ORAL_TABLET | Freq: Four times a day (QID) | ORAL | 0 refills | Status: DC | PRN

## 2020-10-13 NOTE — Telephone Encounter (Signed)
Encounter created in error

## 2020-10-19 ENCOUNTER — Other Ambulatory Visit: Payer: Self-pay | Admitting: Family Medicine

## 2020-10-19 DIAGNOSIS — R1011 Right upper quadrant pain: Secondary | ICD-10-CM

## 2020-10-21 MED ORDER — OXYCODONE-ACETAMINOPHEN 7.5-325 MG PO TABS: 1.0000 | ORAL_TABLET | Freq: Four times a day (QID) | ORAL | 0 refills | Status: DC | PRN

## 2020-11-04 ENCOUNTER — Other Ambulatory Visit: Payer: Self-pay | Admitting: Family Medicine

## 2020-11-04 DIAGNOSIS — R1011 Right upper quadrant pain: Secondary | ICD-10-CM

## 2020-11-05 MED ORDER — OXYCODONE-ACETAMINOPHEN 7.5-325 MG PO TABS: 1.0000 | ORAL_TABLET | Freq: Four times a day (QID) | ORAL | 0 refills | Status: DC | PRN

## 2020-11-19 ENCOUNTER — Other Ambulatory Visit: Payer: Self-pay | Admitting: Family Medicine

## 2020-11-19 DIAGNOSIS — R1011 Right upper quadrant pain: Secondary | ICD-10-CM

## 2020-11-19 DIAGNOSIS — I10 Essential (primary) hypertension: Secondary | ICD-10-CM

## 2020-11-21 MED ORDER — OXYCODONE-ACETAMINOPHEN 7.5-325 MG PO TABS: 1.0000 | ORAL_TABLET | Freq: Four times a day (QID) | ORAL | 0 refills | Status: DC | PRN

## 2020-11-21 NOTE — Telephone Encounter (Signed)
Please review. Thanks!  

## 2020-12-03 ENCOUNTER — Other Ambulatory Visit: Payer: Self-pay | Admitting: Family Medicine

## 2020-12-03 DIAGNOSIS — G8929 Other chronic pain: Secondary | ICD-10-CM

## 2020-12-05 ENCOUNTER — Other Ambulatory Visit: Payer: Self-pay | Admitting: Family Medicine

## 2020-12-05 DIAGNOSIS — R1011 Right upper quadrant pain: Secondary | ICD-10-CM

## 2020-12-05 MED ORDER — OXYCODONE-ACETAMINOPHEN 7.5-325 MG PO TABS: 1.0000 | ORAL_TABLET | Freq: Four times a day (QID) | ORAL | 0 refills | Status: DC | PRN

## 2020-12-17 ENCOUNTER — Other Ambulatory Visit: Payer: Self-pay | Admitting: Family Medicine

## 2020-12-17 DIAGNOSIS — R1011 Right upper quadrant pain: Secondary | ICD-10-CM

## 2020-12-19 ENCOUNTER — Other Ambulatory Visit: Payer: Self-pay

## 2020-12-19 ENCOUNTER — Ambulatory Visit: Payer: 59 | Admitting: Family Medicine

## 2020-12-19 DIAGNOSIS — K811 Chronic cholecystitis: Secondary | ICD-10-CM

## 2020-12-19 DIAGNOSIS — F1190 Opioid use, unspecified, uncomplicated: Secondary | ICD-10-CM

## 2020-12-19 DIAGNOSIS — E1169 Type 2 diabetes mellitus with other specified complication: Secondary | ICD-10-CM | POA: Diagnosis not present

## 2020-12-19 DIAGNOSIS — L301 Dyshidrosis [pompholyx]: Secondary | ICD-10-CM

## 2020-12-19 MED ORDER — OXYCODONE-ACETAMINOPHEN 7.5-325 MG PO TABS: 1.0000 | ORAL_TABLET | Freq: Four times a day (QID) | ORAL | 0 refills | Status: DC | PRN

## 2020-12-19 MED ORDER — OZEMPIC (1 MG/DOSE) 4 MG/3ML ~~LOC~~ SOPN: 1.0000 mg | PEN_INJECTOR | SUBCUTANEOUS | 3 refills | Status: DC

## 2020-12-19 NOTE — Progress Notes (Signed)
Established patient visit   Patient: Jessica Fields   DOB: [birth year]   40 y.o. Female  MRN: [id number] Visit Date: 12/19/2020  Today's healthcare provider: Mila Merry, MD   No chief complaint on file.  Subjective    HPI  Diabetes Mellitus Type II, Follow-up  Lab Results  Component Value Date   HGBA1C 6.7 (A) 08/19/2020   HGBA1C 10.8 (A) 03/24/2020   HGBA1C 9.0 (A) 01/28/2020   Wt Readings from Last 3 Encounters:  08/19/20 (!) 443 lb (200.9 kg)  03/24/20 (!) 461 lb (209.1 kg)  01/28/20 (!) 470 lb (213.2 kg)   Last seen for diabetes 4 months ago.  Management since then includes will start back on low dose- Semaglutide,0.25 or 0.5MG /DOS, (OZEMPIC, 0.25 OR 0.5 MG/DOSE,) 2 MG/1.5ML SOPN; Inject 0.25mg  once a week for 2-3 weeks, then increase to 0.5mg  once a week Will start back on low dose- Semaglutide,0.25 or 0.5MG /DOS, (OZEMPIC, 0.25 OR 0.5 MG/DOSE,) 2 MG/1.5ML SOPN; Inject 0.25mg  once a week for 2-3 weeks, then increase to 0.5mg  once a week. She reports excellent compliance with treatment. She is not having side effects.  Symptoms: No fatigue No foot ulcerations  No appetite changes No nausea  No paresthesia of the feet  No polydipsia  No polyuria No visual disturbances   No vomiting     Home blood sugar records: n/a  Episodes of hypoglycemia? No    Current insulin regiment: n/a - pt is on Ozempic Most Recent Eye Exam: 3/22022 Current exercise: walking Current diet habits: variable  Pertinent Labs: Lab Results  Component Value Date   CHOL 171 09/25/2019   HDL 55 09/25/2019   LDLCALC 85 09/25/2019   TRIG 182 (H) 09/25/2019   CHOLHDL 3.1 09/25/2019   Lab Results  Component Value Date   NA 136 09/25/2019   K 4.9 09/25/2019   CREATININE 0.87 09/25/2019   GFRNONAA 84 09/25/2019   GFRAA 96 09/25/2019   GLUCOSE 275 (H) 09/25/2019     ---------------------------------------------------------------------------------------------------        Medications: Outpatient Medications Prior to Visit  Medication Sig  . amLODipine (NORVASC) 10 MG tablet TAKE 1 TABLET BY MOUTH EVERY DAY  . diclofenac sodium (VOLTAREN) 1 % GEL Apply 4 g topically 4 (four) times daily as needed.  . fluticasone (FLONASE) 50 MCG/ACT nasal spray Place 2 sprays into both nostrils daily as needed.  Marland Kitchen lisinopril (ZESTRIL) 10 MG tablet TAKE 1 TABLET BY MOUTH EVERY DAY  . metFORMIN (GLUCOPHAGE) 500 MG tablet Take 1 tablet (500 mg total) by mouth 3 (three) times daily with meals.  . naproxen (NAPROSYN) 500 MG tablet TAKE 1 TABLET (500 MG TOTAL) BY MOUTH 2 (TWO) TIMES DAILY AS NEEDED FOR MODERATE PAIN.  Marland Kitchen oxyCODONE-acetaminophen (PERCOCET) 7.5-325 MG tablet Take 1 tablet by mouth every 6 (six) hours as needed for severe pain.  . promethazine (PHENERGAN) 25 MG tablet Take 1 tablet (25 mg total) by mouth every 8 (eight) hours as needed for nausea or vomiting.  . Semaglutide,0.25 or 0.5MG /DOS, (OZEMPIC, 0.25 OR 0.5 MG/DOSE,) 2 MG/1.5ML SOPN Inject 0.25mg  once a week for 2-3 weeks, then increase to 0.5mg  once a week.  . sertraline (ZOLOFT) 50 MG tablet TAKE 1 TABLET BY MOUTH EVERY DAY   No facility-administered medications prior to visit.    Review of Systems  Skin:       Dry skin around finger tips.            Objective  BP (!) 160/76 (BP Location: Right Arm, Patient Position: Sitting, Cuff Size: Large)   Pulse (!) 105   Ht 5\' 6"  (1.676 m)   Wt (!) 435 lb 12.8 oz (197.7 kg)   SpO2 100%   BMI 70.34 kg/m  Wt Readings from Last 3 Encounters:  12/19/20 (!) 435 lb 12.8 oz (197.7 kg)  08/19/20 (!) 443 lb (200.9 kg)  03/24/20 (!) 461 lb (209.1 kg)     Physical Exam   General appearance: Severely obese female, cooperative and in no acute distress Head: Normocephalic, without obvious abnormality, atraumatic Respiratory: Respirations even and unlabored, normal respiratory rate Extremities: All extremities are intact.  Skin: Skin color, texture, turgor  normal. No rashes seen  Psych: Appropriate mood and affect. Neurologic: Mental status: Alert, oriented to person, place, and time, thought content appropriate.   No results found for any visits on 12/19/20.  Assessment & Plan     1. Morbid obesity (HCC) She has been losing weight slowly, but consistently since being on semaglutide. She is tolerating well. Will increase to 1mg  weekly as below.   2. Type 2 diabetes mellitus with morbid obesity (HCC) Well controlled. Increase to - Semaglutide, 1 MG/DOSE, (OZEMPIC, 1 MG/DOSE,) 4 MG/3ML SOPN; Inject 1 mg into the skin once a week.  Dispense: 9 mL; Refill: 3 - CBC - Lipid panel - Comprehensive metabolic panel - Hemoglobin A1c  3. Chronic, continuous use of opioids   4. Chronic cholecystitis Pain adequately controlled, Continue current medications.  Refill today.   5. Dyshidrotic eczema Recommend OTC moisturizing and steroid creams. Continue prescription medium potency steroid cream if the OTC is not effective.       The entirety of the information documented in the History of Present Illness, Review of Systems and Physical Exam were personally obtained by me. Portions of this information were initially documented by the CMA and reviewed by me for thoroughness and accuracy.     02/18/21, MD  [facility] [phone] (phone) [phone] (fax)  [facility]

## 2020-12-19 NOTE — Telephone Encounter (Addendum)
Pt called in to follow up on refill request for medication. Pt says that [place] and would like to make sure to have her medication. Pt says that she will be out.   Please assist further.

## 2020-12-19 NOTE — Telephone Encounter (Signed)
Requested medication (s) are due for refill today: Yes  Requested medication (s) are on the active medication list: Yes  Last refill:  12/05/20  Future visit scheduled: No  Notes to clinic:  Unable to refill per protocol, cannot delegate. See notes below from patient.      Requested Prescriptions  Pending Prescriptions Disp Refills   oxyCODONE-acetaminophen (PERCOCET) 7.5-325 MG tablet 60 tablet 0    Sig: Take 1 tablet by mouth every 6 (six) hours as needed for severe pain.      Not Delegated - Analgesics:  Opioid Agonist Combinations Failed - 12/19/2020  4:33 PM      Failed - This refill cannot be delegated      Failed - Urine Drug Screen completed in last 360 days      Passed - Valid encounter within last 6 months    Recent Outpatient Visits           Today Dyshidrotic eczema   [facility] Malva Limes, MD   4 months ago Type 2 diabetes mellitus with morbid obesity [facility])   [facility] Malva Limes, MD   9 months ago Type 2 diabetes mellitus without complication, without long-term current use of insulin [facility])   [facility] Malva Limes, MD   10 months ago Type 2 diabetes mellitus without complication, without long-term current use of insulin [facility])   [facility] Malva Limes, MD   1 year ago Type 2 diabetes mellitus without complication, without long-term current use of insulin [facility])   [facility] Malva Limes, MD

## 2020-12-19 NOTE — Patient Instructions (Addendum)
.   Keep your hands moisturized. Avoid drying gels, like alcohol gels. You can also apply 1% hydrocortisone cream once or twice a day.

## 2020-12-20 LAB — COMPREHENSIVE METABOLIC PANEL
ALT: 21 IU/L (ref 0–32)
AST: 19 IU/L (ref 0–40)
Albumin/Globulin Ratio: 1.2 (ref 1.2–2.2)
Albumin: 4.4 g/dL (ref 3.8–4.8)
Alkaline Phosphatase: 93 IU/L (ref 44–121)
BUN/Creatinine Ratio: 17 (ref 9–23)
BUN: 15 mg/dL (ref 6–24)
Bilirubin Total: 0.3 mg/dL (ref 0.0–1.2)
CO2: 23 mmol/L (ref 20–29)
Calcium: 9.4 mg/dL (ref 8.7–10.2)
Chloride: 99 mmol/L (ref 96–106)
Creatinine, Ser: 0.9 mg/dL (ref 0.57–1.00)
Globulin, Total: 3.7 g/dL (ref 1.5–4.5)
Glucose: 145 mg/dL — ABNORMAL HIGH (ref 65–99)
Potassium: 4.6 mmol/L (ref 3.5–5.2)
Sodium: 140 mmol/L (ref 134–144)
Total Protein: 8.1 g/dL (ref 6.0–8.5)
eGFR: 82 mL/min/{1.73_m2} (ref >59)

## 2020-12-20 LAB — LIPID PANEL
Chol/HDL Ratio: 3.6 ratio (ref 0.0–4.4)
Cholesterol, Total: 189 mg/dL (ref 100–199)
HDL: 53 mg/dL (ref >39)
LDL Chol Calc (NIH): 111 mg/dL — ABNORMAL HIGH (ref 0–99)
Triglycerides: 144 mg/dL (ref 0–149)
VLDL Cholesterol Cal: 25 mg/dL (ref 5–40)

## 2020-12-20 LAB — CBC
Hematocrit: 37 % (ref 34.0–46.6)
Hemoglobin: 12 g/dL (ref 11.1–15.9)
MCH: 25.5 pg — ABNORMAL LOW (ref 26.6–33.0)
MCHC: 32.4 g/dL (ref 31.5–35.7)
MCV: 79 fL (ref 79–97)
Platelets: 380 10*3/uL (ref 150–450)
RBC: 4.71 x10E6/uL (ref 3.77–5.28)
RDW: 14.4 % (ref 11.7–15.4)
WBC: 15.9 10*3/uL — ABNORMAL HIGH (ref 3.4–10.8)

## 2020-12-20 LAB — HEMOGLOBIN A1C
Est. average glucose Bld gHb Est-mCnc: 134 mg/dL
Hgb A1c MFr Bld: 6.3 % — ABNORMAL HIGH (ref 4.8–5.6)

## 2021-01-18 ENCOUNTER — Other Ambulatory Visit: Payer: Self-pay | Admitting: Family Medicine

## 2021-01-18 DIAGNOSIS — R1011 Right upper quadrant pain: Secondary | ICD-10-CM

## 2021-01-19 MED ORDER — OXYCODONE-ACETAMINOPHEN 7.5-325 MG PO TABS: 1.0000 | ORAL_TABLET | Freq: Four times a day (QID) | ORAL | 0 refills | Status: DC | PRN

## 2021-02-12 ENCOUNTER — Other Ambulatory Visit: Payer: Self-pay | Admitting: Family Medicine

## 2021-02-12 DIAGNOSIS — R1011 Right upper quadrant pain: Secondary | ICD-10-CM

## 2021-02-13 NOTE — Telephone Encounter (Signed)
30 days supply dispensed 01-19-2021 

## 2021-02-16 MED ORDER — OXYCODONE-ACETAMINOPHEN 7.5-325 MG PO TABS: 1.0000 | ORAL_TABLET | Freq: Four times a day (QID) | ORAL | 0 refills | Status: DC | PRN

## 2021-02-17 ENCOUNTER — Other Ambulatory Visit: Payer: Self-pay | Admitting: Family Medicine

## 2021-02-17 DIAGNOSIS — F439 Reaction to severe stress, unspecified: Secondary | ICD-10-CM

## 2021-02-17 DIAGNOSIS — I10 Essential (primary) hypertension: Secondary | ICD-10-CM

## 2021-02-17 NOTE — Telephone Encounter (Signed)
Requested Prescriptions  Pending Prescriptions Disp Refills  . lisinopril (ZESTRIL) 10 MG tablet [Pharmacy Med Name: LISINOPRIL 10 MG TABLET] 90 tablet 1    Sig: TAKE 1 TABLET BY MOUTH EVERY DAY     Cardiovascular:  ACE Inhibitors Failed - 02/17/2021  1:27 AM      Failed - Last BP in normal range    BP Readings from Last 1 Encounters:  12/19/20 (!) 160/76         Passed - Cr in normal range and within 180 days    Creat  Date Value Ref Range Status  07/06/2017 0.96 0.50 - 1.10 mg/dL Final   Creatinine, Ser  Date Value Ref Range Status  12/19/2020 0.90 0.57 - 1.00 mg/dL Final         Passed - K in normal range and within 180 days    Potassium  Date Value Ref Range Status  12/19/2020 4.6 3.5 - 5.2 mmol/L Final  02/08/2015 3.6 mmol/L Final    Comment:    3.5-5.1 NOTE: New Reference Range  12/24/14          Passed - Patient is not pregnant      Passed - Valid encounter within last 6 months    Recent Outpatient Visits          2 months ago Morbid obesity [facility])   [facility] Malva Limes, MD   6 months ago Type 2 diabetes mellitus with morbid obesity (HCC)   [facility] Malva Limes, MD   11 months ago Type 2 diabetes mellitus without complication, without long-term current use of insulin (HCC)   [facility] Malva Limes, MD   1 year ago Type 2 diabetes mellitus without complication, without long-term current use of insulin (HCC)   [facility] Malva Limes, MD   1 year ago Type 2 diabetes mellitus without complication, without long-term current use of insulin (HCC)   [facility] Malva Limes, MD             . sertraline (ZOLOFT) 50 MG tablet [Pharmacy Med Name: SERTRALINE HCL 50 MG TABLET] 90 tablet 1    Sig: TAKE 1 TABLET BY MOUTH EVERY DAY     Psychiatry:  Antidepressants - SSRI Passed - 02/17/2021  1:27 AM      Passed - Valid encounter within last 6 months    Recent  Outpatient Visits          2 months ago Morbid obesity [facility])   [facility] Malva Limes, MD   6 months ago Type 2 diabetes mellitus with morbid obesity [facility])   [facility] Malva Limes, MD   11 months ago Type 2 diabetes mellitus without complication, without long-term current use of insulin [facility])   [facility] Malva Limes, MD   1 year ago Type 2 diabetes mellitus without complication, without long-term current use of insulin [facility])   [facility] Malva Limes, MD   1 year ago Type 2 diabetes mellitus without complication, without long-term current use of insulin [facility])   [facility] Sherrie Mustache, Demetrios Isaacs, MD

## 2021-03-17 ENCOUNTER — Other Ambulatory Visit: Payer: Self-pay | Admitting: Family Medicine

## 2021-03-17 DIAGNOSIS — R1011 Right upper quadrant pain: Secondary | ICD-10-CM

## 2021-03-18 MED ORDER — OXYCODONE-ACETAMINOPHEN 7.5-325 MG PO TABS: 1.0000 | ORAL_TABLET | Freq: Four times a day (QID) | ORAL | 0 refills | Status: DC | PRN

## 2021-04-14 ENCOUNTER — Other Ambulatory Visit: Payer: Self-pay | Admitting: Family Medicine

## 2021-04-14 DIAGNOSIS — R1011 Right upper quadrant pain: Secondary | ICD-10-CM

## 2021-04-16 MED ORDER — OXYCODONE-ACETAMINOPHEN 7.5-325 MG PO TABS: 1.0000 | ORAL_TABLET | Freq: Four times a day (QID) | ORAL | 0 refills | Status: DC | PRN

## 2021-04-16 NOTE — Telephone Encounter (Signed)
done

## 2021-04-16 NOTE — Telephone Encounter (Signed)
Patient checking on the status of oxyCODONE-acetaminophen (PERCOCET) 7.5-325 MG tablet request and would like a follow up regarding the status

## 2021-05-12 ENCOUNTER — Other Ambulatory Visit: Payer: Self-pay | Admitting: Family Medicine

## 2021-05-12 DIAGNOSIS — R1011 Right upper quadrant pain: Secondary | ICD-10-CM

## 2021-05-12 NOTE — Telephone Encounter (Signed)
Patient requesting refills. Please review. Thanks!  

## 2021-05-14 MED ORDER — OXYCODONE-ACETAMINOPHEN 7.5-325 MG PO TABS: 1.0000 | ORAL_TABLET | Freq: Four times a day (QID) | ORAL | 0 refills | Status: DC | PRN

## 2021-05-23 ENCOUNTER — Other Ambulatory Visit: Payer: Self-pay | Admitting: Family Medicine

## 2021-05-23 DIAGNOSIS — I10 Essential (primary) hypertension: Secondary | ICD-10-CM

## 2021-05-23 NOTE — Telephone Encounter (Signed)
Last RF 11/20/19 #90 1 RF Pt overdue for visit. Sent pt 30 day courtesy RF. Requested Prescriptions  Pending Prescriptions Disp Refills   amLODipine (NORVASC) 10 MG tablet [Pharmacy Med Name: AMLODIPINE BESYLATE 10 MG TAB] 90 tablet 1    Sig: TAKE 1 TABLET BY MOUTH EVERY DAY      Cardiovascular:  Calcium Channel Blockers Failed - 05/23/2021  8:19 AM      Failed - Last BP in normal range    BP Readings from Last 1 Encounters:  12/19/20 (!) 160/76          Passed - Valid encounter within last 6 months    Recent Outpatient Visits           5 months ago Morbid obesity [facility])   [facility] Malva Limes, MD   9 months ago Type 2 diabetes mellitus with morbid obesity [facility])   [facility] Malva Limes, MD   1 year ago Type 2 diabetes mellitus without complication, without long-term current use of insulin (HCC)   [facility] Malva Limes, MD   1 year ago Type 2 diabetes mellitus without complication, without long-term current use of insulin [facility])   [facility] Malva Limes, MD   1 year ago Type 2 diabetes mellitus without complication, without long-term current use of insulin [facility])   [facility] Sherrie Mustache, Demetrios Isaacs, MD

## 2021-06-10 ENCOUNTER — Other Ambulatory Visit: Payer: Self-pay | Admitting: Family Medicine

## 2021-06-10 DIAGNOSIS — R1011 Right upper quadrant pain: Secondary | ICD-10-CM

## 2021-06-11 MED ORDER — OXYCODONE-ACETAMINOPHEN 7.5-325 MG PO TABS: 1.0000 | ORAL_TABLET | Freq: Four times a day (QID) | ORAL | 0 refills | Status: DC | PRN

## 2021-06-25 ENCOUNTER — Other Ambulatory Visit: Payer: Self-pay | Admitting: Family Medicine

## 2021-06-25 DIAGNOSIS — I10 Essential (primary) hypertension: Secondary | ICD-10-CM

## 2021-06-26 NOTE — Telephone Encounter (Signed)
Requested medications are due for refill today yes  Requested medications are on the active medication list yes  Last refill 05/25/21  Last visit 12/19/20  Future visit scheduled No  Notes to clinic Has already had a curtesy refill and there is no upcoming appointment scheduled.

## 2021-07-06 ENCOUNTER — Other Ambulatory Visit: Payer: Self-pay | Admitting: Family Medicine

## 2021-07-06 DIAGNOSIS — G8929 Other chronic pain: Secondary | ICD-10-CM

## 2021-07-06 DIAGNOSIS — M25562 Pain in left knee: Secondary | ICD-10-CM

## 2021-07-10 ENCOUNTER — Other Ambulatory Visit: Payer: Self-pay | Admitting: Family Medicine

## 2021-07-10 DIAGNOSIS — R1011 Right upper quadrant pain: Secondary | ICD-10-CM

## 2021-07-11 MED ORDER — OXYCODONE-ACETAMINOPHEN 7.5-325 MG PO TABS: 1.0000 | ORAL_TABLET | Freq: Four times a day (QID) | ORAL | 0 refills | Status: DC | PRN

## 2021-07-28 ENCOUNTER — Other Ambulatory Visit: Payer: Self-pay | Admitting: Family Medicine

## 2021-07-28 DIAGNOSIS — I10 Essential (primary) hypertension: Secondary | ICD-10-CM

## 2021-08-07 ENCOUNTER — Other Ambulatory Visit: Payer: Self-pay | Admitting: Family Medicine

## 2021-08-07 DIAGNOSIS — R1011 Right upper quadrant pain: Secondary | ICD-10-CM

## 2021-08-10 MED ORDER — OXYCODONE-ACETAMINOPHEN 7.5-325 MG PO TABS: 1.0000 | ORAL_TABLET | Freq: Four times a day (QID) | ORAL | 0 refills | Status: DC | PRN

## 2021-08-28 ENCOUNTER — Other Ambulatory Visit: Payer: Self-pay | Admitting: Family Medicine

## 2021-08-28 DIAGNOSIS — F439 Reaction to severe stress, unspecified: Secondary | ICD-10-CM

## 2021-08-28 DIAGNOSIS — I10 Essential (primary) hypertension: Secondary | ICD-10-CM

## 2021-08-28 NOTE — Telephone Encounter (Signed)
Requested medications are due for refill today yes  Requested medications are on the active medication list yes  Last refill 05/23/21  Last visit 12/19/20  Future visit scheduled no  Notes to clinic .failed protocol of labs within 180 days, please assess. Requested Prescriptions  Pending Prescriptions Disp Refills   lisinopril (ZESTRIL) 10 MG tablet [Pharmacy Med Name: LISINOPRIL 10 MG TABLET] 90 tablet 1    Sig: TAKE 1 TABLET BY MOUTH EVERY DAY     Cardiovascular:  ACE Inhibitors Failed - 08/28/2021  2:42 AM      Failed - Cr in normal range and within 180 days    Creat  Date Value Ref Range Status  07/06/2017 0.96 0.50 - 1.10 mg/dL Final   Creatinine, Ser  Date Value Ref Range Status  12/19/2020 0.90 0.57 - 1.00 mg/dL Final          Failed - K in normal range and within 180 days    Potassium  Date Value Ref Range Status  12/19/2020 4.6 3.5 - 5.2 mmol/L Final  02/08/2015 3.6 mmol/L Final    Comment:    3.5-5.1 NOTE: New Reference Range  12/24/14           Failed - Last BP in normal range    BP Readings from Last 1 Encounters:  12/19/20 (!) 160/76          Failed - Valid encounter within last 6 months    Recent Outpatient Visits           8 months ago Morbid obesity [facility])   [facility] Malva Limes, MD   1 year ago Type 2 diabetes mellitus with morbid obesity (HCC)   [facility] Malva Limes, MD   1 year ago Type 2 diabetes mellitus without complication, without long-term current use of insulin (HCC)   [facility] Malva Limes, MD   1 year ago Type 2 diabetes mellitus without complication, without long-term current use of insulin (HCC)   [facility] Malva Limes, MD   1 year ago Type 2 diabetes mellitus without complication, without long-term current use of insulin (HCC)   [facility] Malva Limes, MD              Passed - Patient is not pregnant      Signed  Prescriptions Disp Refills   sertraline (ZOLOFT) 50 MG tablet 30 tablet 0    Sig: TAKE 1 TABLET BY MOUTH EVERY DAY     Psychiatry:  Antidepressants - SSRI Failed - 08/28/2021  2:42 AM      Failed - Valid encounter within last 6 months    Recent Outpatient Visits           8 months ago Morbid obesity [facility])   [facility] Malva Limes, MD   1 year ago Type 2 diabetes mellitus with morbid obesity [facility])   [facility] Malva Limes, MD   1 year ago Type 2 diabetes mellitus without complication, without long-term current use of insulin [facility])   [facility] Malva Limes, MD   1 year ago Type 2 diabetes mellitus without complication, without long-term current use of insulin [facility])   [facility] Malva Limes, MD   1 year ago Type 2 diabetes mellitus without complication, without long-term current use of insulin [facility])   [facility] Sherrie Mustache, Demetrios Isaacs, MD

## 2021-08-28 NOTE — Telephone Encounter (Signed)
Requested Prescriptions  Pending Prescriptions Disp Refills  . lisinopril (ZESTRIL) 10 MG tablet [Pharmacy Med Name: LISINOPRIL 10 MG TABLET] 90 tablet 1    Sig: TAKE 1 TABLET BY MOUTH EVERY DAY     Cardiovascular:  ACE Inhibitors Failed - 08/28/2021  2:42 AM      Failed - Cr in normal range and within 180 days    Creat  Date Value Ref Range Status  07/06/2017 0.96 0.50 - 1.10 mg/dL Final   Creatinine, Ser  Date Value Ref Range Status  12/19/2020 0.90 0.57 - 1.00 mg/dL Final         Failed - K in normal range and within 180 days    Potassium  Date Value Ref Range Status  12/19/2020 4.6 3.5 - 5.2 mmol/L Final  02/08/2015 3.6 mmol/L Final    Comment:    3.5-5.1 NOTE: New Reference Range  12/24/14          Failed - Last BP in normal range    BP Readings from Last 1 Encounters:  12/19/20 (!) 160/76         Failed - Valid encounter within last 6 months    Recent Outpatient Visits          8 months ago Morbid obesity [facility])   [facility] Malva Limes, MD   1 year ago Type 2 diabetes mellitus with morbid obesity [facility])   [facility] Malva Limes, MD   1 year ago Type 2 diabetes mellitus without complication, without long-term current use of insulin (HCC)   [facility] Malva Limes, MD   1 year ago Type 2 diabetes mellitus without complication, without long-term current use of insulin [facility])   [facility] Limes, MD   1 year ago Type 2 diabetes mellitus without complication, without long-term current use of insulin [facility])   [facility] Sherrie Mustache, Demetrios Isaacs, MD             Passed - Patient is not pregnant      . sertraline (ZOLOFT) 50 MG tablet [Pharmacy Med Name: SERTRALINE HCL 50 MG TABLET] 30 tablet 0    Sig: TAKE 1 TABLET BY MOUTH EVERY DAY     Psychiatry:  Antidepressants - SSRI Failed - 08/28/2021  2:42 AM      Failed - Valid encounter within last 6 months    Recent  Outpatient Visits          8 months ago Morbid obesity [facility])   [facility], MD   1 year ago Type 2 diabetes mellitus with morbid obesity [facility])   [facility] Malva Limes, MD   1 year ago Type 2 diabetes mellitus without complication, without long-term current use of insulin [facility])   [facility] Malva Limes, MD   1 year ago Type 2 diabetes mellitus without complication, without long-term current use of insulin [facility])   [facility] Malva Limes, MD   1 year ago Type 2 diabetes mellitus without complication, without long-term current use of insulin [facility])   [facility] Sherrie Mustache, Demetrios Isaacs, MD

## 2021-09-01 ENCOUNTER — Other Ambulatory Visit: Payer: Self-pay | Admitting: Family Medicine

## 2021-09-01 DIAGNOSIS — I10 Essential (primary) hypertension: Secondary | ICD-10-CM

## 2021-09-01 NOTE — Telephone Encounter (Signed)
Requested medication (s) are due for refill today: yes  Requested medication (s) are on the active medication list: yes  Last refill:  07/28/21 #30/0 RF  Future visit scheduled: No  Notes to clinic:  Unable to refill per protocol, appointment needed.  Pt called and left VM.      Requested Prescriptions  Pending Prescriptions Disp Refills   amLODipine (NORVASC) 10 MG tablet [Pharmacy Med Name: AMLODIPINE BESYLATE 10 MG TAB] 30 tablet 0    Sig: TAKE 1 TABLET (10 MG TOTAL) BY MOUTH DAILY. PLEASE SCHEDULE AN OFFICE VISIT BEFORE ANYMORE REFILLS.     Cardiovascular:  Calcium Channel Blockers Failed - 09/01/2021  2:33 PM      Failed - Last BP in normal range    BP Readings from Last 1 Encounters:  12/19/20 (!) 160/76          Failed - Valid encounter within last 6 months    Recent Outpatient Visits           8 months ago Morbid obesity [facility])   [facility] Malva Limes, MD   1 year ago Type 2 diabetes mellitus with morbid obesity [facility])   [facility] Malva Limes, MD   1 year ago Type 2 diabetes mellitus without complication, without long-term current use of insulin [facility])   [facility] Malva Limes, MD   1 year ago Type 2 diabetes mellitus without complication, without long-term current use of insulin [facility])   [facility] Malva Limes, MD   1 year ago Type 2 diabetes mellitus without complication, without long-term current use of insulin [facility])   [facility] Mal[facility] Limes, MD

## 2021-09-01 NOTE — Telephone Encounter (Signed)
Patient called, left VM to return the call to the office to scheduled an appt for medication refill request.   

## 2021-09-07 ENCOUNTER — Other Ambulatory Visit: Payer: Self-pay | Admitting: Family Medicine

## 2021-09-07 DIAGNOSIS — R1011 Right upper quadrant pain: Secondary | ICD-10-CM

## 2021-09-08 ENCOUNTER — Other Ambulatory Visit: Payer: Self-pay | Admitting: Family Medicine

## 2021-09-08 DIAGNOSIS — R1011 Right upper quadrant pain: Secondary | ICD-10-CM

## 2021-09-08 NOTE — Telephone Encounter (Signed)
Lmtcb to schedule appt.

## 2021-09-08 NOTE — Telephone Encounter (Signed)
She is overdue for follow up and needs to schedule within the next month before prescription can be approved.

## 2021-09-09 MED ORDER — OXYCODONE-ACETAMINOPHEN 7.5-325 MG PO TABS: 1.0000 | ORAL_TABLET | Freq: Four times a day (QID) | ORAL | 0 refills | Status: DC | PRN

## 2021-10-01 ENCOUNTER — Other Ambulatory Visit: Payer: Self-pay | Admitting: Family Medicine

## 2021-10-01 DIAGNOSIS — I10 Essential (primary) hypertension: Secondary | ICD-10-CM

## 2021-10-01 DIAGNOSIS — F439 Reaction to severe stress, unspecified: Secondary | ICD-10-CM

## 2021-10-01 NOTE — Telephone Encounter (Signed)
Patient is due for office visit. Will refill prescription for 30 day supply to get by until upcoming appointment scheduled on 10/05/2021.

## 2021-10-05 ENCOUNTER — Other Ambulatory Visit: Payer: Self-pay

## 2021-10-05 ENCOUNTER — Ambulatory Visit: Payer: 59 | Admitting: Family Medicine

## 2021-10-05 ENCOUNTER — Encounter: Payer: Self-pay | Admitting: Family Medicine

## 2021-10-05 VITALS — BP 163/83 | HR 82 | Temp 98.6°F | Resp 16 | Ht 66.0 in | Wt >= 6400 oz

## 2021-10-05 DIAGNOSIS — G8929 Other chronic pain: Secondary | ICD-10-CM

## 2021-10-05 DIAGNOSIS — Z23 Encounter for immunization: Secondary | ICD-10-CM

## 2021-10-05 DIAGNOSIS — F1190 Opioid use, unspecified, uncomplicated: Secondary | ICD-10-CM | POA: Diagnosis not present

## 2021-10-05 DIAGNOSIS — E1169 Type 2 diabetes mellitus with other specified complication: Secondary | ICD-10-CM

## 2021-10-05 DIAGNOSIS — I10 Essential (primary) hypertension: Secondary | ICD-10-CM

## 2021-10-05 DIAGNOSIS — M25561 Pain in right knee: Secondary | ICD-10-CM | POA: Diagnosis not present

## 2021-10-05 DIAGNOSIS — M7661 Achilles tendinitis, right leg: Secondary | ICD-10-CM

## 2021-10-05 DIAGNOSIS — M25562 Pain in left knee: Secondary | ICD-10-CM

## 2021-10-05 DIAGNOSIS — M7662 Achilles tendinitis, left leg: Secondary | ICD-10-CM

## 2021-10-05 LAB — POCT GLYCOSYLATED HEMOGLOBIN (HGB A1C)
Est. average glucose Bld gHb Est-mCnc: 140
Hemoglobin A1C: 6.5 % — AB (ref 4.0–5.6)

## 2021-10-05 MED ORDER — TIRZEPATIDE 2.5 MG/0.5ML ~~LOC~~ SOAJ: 2.5000 mg | SUBCUTANEOUS | 0 refills | Status: DC

## 2021-10-05 MED ORDER — CELECOXIB 200 MG PO CAPS: 200.0000 mg | ORAL_CAPSULE | Freq: Two times a day (BID) | ORAL | 3 refills | Status: DC

## 2021-10-05 MED ORDER — LISINOPRIL-HYDROCHLOROTHIAZIDE 10-12.5 MG PO TABS
1.0000 | ORAL_TABLET | Freq: Every day | ORAL | 3 refills | Status: DC
Start: 2021-10-05 — End: 2022-02-16

## 2021-10-05 NOTE — Progress Notes (Signed)
I,April Miller,acting as a scribe for Mila Merry, MD.,have documented all relevant documentation on the behalf of Mila Merry, MD,as directed by  Mila Merry, MD while in the presence of Mila Merry, MD.   Established patient visit   Patient: Annalena Piatt [place]   DOB: May 02, 1981   40 y.o. Female  MRN: [id number] Visit Date: 10/05/2021  Today's healthcare provider: Mila Merry, MD   Chief Complaint  Patient presents with   Follow-up   Diabetes   Hypertension   Subjective    HPI  Diabetes Mellitus Type II, follow-up  Lab Results  Component Value Date   HGBA1C 6.3 (H) 12/19/2020   HGBA1C 6.7 (A) 08/19/2020   HGBA1C 10.8 (A) 03/24/2020   Last seen for diabetes 9 months ago.  Management since then includes; Well controlled. Increase to - Semaglutide, 1 MG/DOSE, (OZEMPIC, 1 MG/DOSE,) 4 MG/3ML SOPN; Inject 1 mg into the skin once a week.she did not tolerate the 1mg  Ozempic and has gone back down to 0.5mg  dose once a week.  She reports good compliance with treatment. She is not having side effects. none  Home blood sugar records: fasting range: not checking  Episodes of hypoglycemia? No none   Current insulin regiment: OZEMPIC Most Recent Eye Exam: 6 month  ago at [facility]  --------------------------------------------------------------------------------------------------- Hypertension, follow-up  BP Readings from Last 3 Encounters:  10/05/21 (!) 163/83  12/19/20 (!) 160/76  08/19/20 (!) 152/80   Wt Readings from Last 3 Encounters:  10/05/21 (!) 447 lb (202.8 kg)  12/19/20 (!) 435 lb 12.8 oz (197.7 kg)  08/19/20 (!) 443 lb (200.9 kg)     She was last seen for hypertension 9 months ago.  BP at that visit was 160/76. Management since that visit includes; on lisinopril and amlodipine. She reports good compliance with treatment. She is not having side effects. none She is not exercising. She is not adherent to low salt diet.   Outside blood pressures checks  occasionally.  She does not smoke.  Use of agents associated with hypertension: none.   ---------------------------------------------------------------------------------------------------     Medications: Outpatient Medications Prior to Visit  Medication Sig   amLODipine (NORVASC) 10 MG tablet TAKE 1 TABLET (10 MG TOTAL) BY MOUTH DAILY. PLEASE SCHEDULE AN OFFICE VISIT BEFORE ANYMORE REFILLS.   diclofenac sodium (VOLTAREN) 1 % GEL Apply 4 g topically 4 (four) times daily as needed.   fluticasone (FLONASE) 50 MCG/ACT nasal spray Place 2 sprays into both nostrils daily as needed.   lisinopril (ZESTRIL) 10 MG tablet TAKE 1 TABLET BY MOUTH EVERY DAY   metFORMIN (GLUCOPHAGE) 500 MG tablet Take 1 tablet (500 mg total) by mouth 3 (three) times daily with meals.   naproxen (NAPROSYN) 500 MG tablet TAKE 1 TABLET (500 MG TOTAL) BY MOUTH 2 (TWO) TIMES DAILY AS NEEDED FOR MODERATE PAIN.   oxyCODONE-acetaminophen (PERCOCET) 7.5-325 MG tablet Take 1 tablet by mouth every 6 (six) hours as needed for severe pain.   promethazine (PHENERGAN) 25 MG tablet Take 1 tablet (25 mg total) by mouth every 8 (eight) hours as needed for nausea or vomiting.   Semaglutide, 1 MG/DOSE, (OZEMPIC, 1 MG/DOSE,) 4 MG/3ML SOPN Inject 1 mg into the skin once a week.   sertraline (ZOLOFT) 50 MG tablet TAKE 1 TABLET BY MOUTH EVERY DAY   No facility-administered medications prior to visit.    Review of Systems  Constitutional:  Negative for appetite change, chills, fatigue and fever.  Respiratory:  Negative for chest  tightness and shortness of breath.   Cardiovascular:  Negative for chest pain and palpitations.  Gastrointestinal:  Negative for abdominal pain, nausea and vomiting.  Neurological:  Negative for dizziness and weakness.      Objective    BP (!) 163/83 (BP Location: Left Arm, Patient Position: Sitting, Cuff Size: Large)    Pulse 82    Temp 98.6 F (37 C) (Temporal)    Resp 16    Ht 5\' 6"  (1.676 m)    Wt (!)  447 lb (202.8 kg)    SpO2 96%    BMI 72.15 kg/m  {Show previous vital signs (optional):23777}  Physical Exam    General: Appearance:    Severely obese female in no acute distress  Eyes:    PERRL, conjunctiva/corneas clear, EOM's intact       Lungs:     Clear to auscultation bilaterally, respirations unlabored  Heart:    Normal heart rate. Normal rhythm. No murmurs, rubs, or gallops.    MS:   All extremities are intact.  Tender both achilles tendons. Pain reproduced by ankle extension of both sides.   Neurologic:   Awake, alert, oriented x 3. No apparent focal neurological defect.        A1c=6.5%  Assessment & Plan     1. Chronic, continuous use of opioids  - Pain Mgt Scrn (14 Drugs), Ur  2. Chronic pain of both knees  - Pain Mgt Scrn (14 Drugs), Ur  3. Type 2 diabetes mellitus with morbid obesity (HCC)  - Urine Microalbumin w/creat. ratio  A1c is better on 1mg  Ozempic, but she cannot tolerate the 1mg  dose due to nausea.  Try changing Ozempic to samples of tirzepatide [facility]) 2.5 MG/0.5ML Pen; Inject 2.5 mg into the skin once a week.  Dispense: 2 mL; Refill: 0  4. Need for influenza vaccination  - Flu Vaccine QUAD 36+ mos IM (Fluarix/Fluzone)  5. Achilles tendinitis of both lower extremities  - celecoxib (CELEBREX) 200 MG capsule; Take 1 capsule (200 mg total) by mouth 2 (two) times daily.  Dispense: 60 capsule; Refill: 3 ( in place of naprosyn) - Ambulatory referral to Podiatry  6. Primary hypertension Uncontrolled. Change lisinopril 10mg  to lisinopril-hydrochlorothiazide (ZESTORETIC) 10-12.5 MG tablet; Take 1 tablet by mouth daily.  Dispense: 30 tablet; Refill: 3  Follow up BP and on Mounjaro samples in 1-2 months.       The entirety of the information documented in the History of Present Illness, Review of Systems and Physical Exam were personally obtained by me. Portions of this information were initially documented by the CMA and reviewed by me for thoroughness  and accuracy.     , MD  [facility] [phone] (phone) [phone] (fax)  [facility]

## 2021-10-06 ENCOUNTER — Other Ambulatory Visit: Payer: Self-pay | Admitting: Family Medicine

## 2021-10-06 DIAGNOSIS — R1011 Right upper quadrant pain: Secondary | ICD-10-CM

## 2021-10-06 LAB — MICROALBUMIN / CREATININE URINE RATIO
Creatinine, Urine: 86.7 mg/dL
Microalb/Creat Ratio: 124 mg/g creat — ABNORMAL HIGH (ref 0–29)
Microalbumin, Urine: 107.8 ug/mL

## 2021-10-06 LAB — SPECIMEN STATUS REPORT

## 2021-10-06 MED ORDER — OXYCODONE-ACETAMINOPHEN 7.5-325 MG PO TABS: 1.0000 | ORAL_TABLET | Freq: Four times a day (QID) | ORAL | 0 refills | Status: DC | PRN

## 2021-10-08 LAB — PAIN MGT SCRN (14 DRUGS), UR
Amphetamine Scrn, Ur: NEGATIVE ng/mL
BARBITURATE SCREEN URINE: NEGATIVE ng/mL
BENZODIAZEPINE SCREEN, URINE: POSITIVE ng/mL — AB
Buprenorphine, Urine: NEGATIVE ng/mL
CANNABINOIDS UR QL SCN: NEGATIVE ng/mL
Cocaine (Metab) Scrn, Ur: NEGATIVE ng/mL
Creatinine(Crt), U: 97.3 mg/dL (ref 20.0–300.0)
Fentanyl, Urine: NEGATIVE pg/mL
Meperidine Screen, Urine: NEGATIVE ng/mL
Methadone Screen, Urine: NEGATIVE ng/mL
OXYCODONE+OXYMORPHONE UR QL SCN: POSITIVE ng/mL — AB
Opiate Scrn, Ur: POSITIVE ng/mL — AB
Ph of Urine: 5.3 (ref 4.5–8.9)
Phencyclidine Qn, Ur: NEGATIVE ng/mL
Propoxyphene Scrn, Ur: NEGATIVE ng/mL
Tramadol Screen, Urine: NEGATIVE ng/mL

## 2021-10-08 LAB — SPECIMEN STATUS REPORT

## 2021-10-12 ENCOUNTER — Other Ambulatory Visit: Payer: Self-pay | Admitting: Family Medicine

## 2021-10-12 DIAGNOSIS — I10 Essential (primary) hypertension: Secondary | ICD-10-CM

## 2021-10-12 DIAGNOSIS — F439 Reaction to severe stress, unspecified: Secondary | ICD-10-CM

## 2021-10-13 ENCOUNTER — Other Ambulatory Visit: Payer: Self-pay | Admitting: Family Medicine

## 2021-10-13 DIAGNOSIS — R1011 Right upper quadrant pain: Secondary | ICD-10-CM

## 2021-10-13 MED ORDER — OXYCODONE-ACETAMINOPHEN 7.5-325 MG PO TABS: 1.0000 | ORAL_TABLET | Freq: Four times a day (QID) | ORAL | 0 refills | Status: DC | PRN

## 2021-10-13 NOTE — Telephone Encounter (Signed)
Medication Refill - Medication: Oxycodone 7.5/325  Has the patient contacted their pharmacy? Yes   she is changing pharmacy die to insurance (Agent: If no, request that the patient contact the pharmacy for the refill. If patient does not wish to contact the pharmacy document the reason why and proceed with request.) (Agent: If yes, when and what did the pharmacy advise?)  Preferred Pharmacy (with phone number or street name): [facility] [address] Has the patient been seen for an appointment in the last year OR does the patient have an upcoming appointment? yes  Agent: Please be advised that RX refills may take up to 3 business days. We ask that you follow-up with your pharmacy.

## 2021-10-13 NOTE — Telephone Encounter (Signed)
Please review for refill. Patient is requesting medication to be sent to a different pharmacy. Previously sent to [facility] on 10/06/21

## 2021-10-28 ENCOUNTER — Encounter: Payer: Self-pay | Admitting: Family Medicine

## 2021-10-29 MED ORDER — TIRZEPATIDE 2.5 MG/0.5ML ~~LOC~~ SOAJ: 2.5000 mg | SUBCUTANEOUS | 0 refills | Status: DC

## 2021-10-29 NOTE — Addendum Note (Signed)
Addended by: Awilda Bill L on: 10/29/2021 11:19 AM   Modules accepted: Orders

## 2021-11-11 ENCOUNTER — Other Ambulatory Visit: Payer: Self-pay | Admitting: Family Medicine

## 2021-11-11 DIAGNOSIS — R1011 Right upper quadrant pain: Secondary | ICD-10-CM

## 2021-11-12 ENCOUNTER — Telehealth: Payer: Self-pay | Admitting: Family Medicine

## 2021-11-12 MED ORDER — OXYCODONE-ACETAMINOPHEN 7.5-325 MG PO TABS: 1.0000 | ORAL_TABLET | Freq: Four times a day (QID) | ORAL | 0 refills | Status: DC | PRN

## 2021-11-12 NOTE — Telephone Encounter (Signed)
Rx written in December, with refills.

## 2021-11-12 NOTE — Telephone Encounter (Signed)
[facility] fax[facility] refill request for the following medications:  sertraline (ZOLOFT) 50 MG tablet    Please advise.  

## 2021-11-16 ENCOUNTER — Ambulatory Visit: Payer: 59 | Admitting: Family Medicine

## 2021-11-16 ENCOUNTER — Other Ambulatory Visit: Payer: Self-pay

## 2021-11-16 ENCOUNTER — Other Ambulatory Visit: Payer: Self-pay | Admitting: Family Medicine

## 2021-11-16 ENCOUNTER — Encounter: Payer: Self-pay | Admitting: Family Medicine

## 2021-11-16 DIAGNOSIS — E1169 Type 2 diabetes mellitus with other specified complication: Secondary | ICD-10-CM

## 2021-11-16 DIAGNOSIS — I10 Essential (primary) hypertension: Secondary | ICD-10-CM

## 2021-11-16 DIAGNOSIS — F439 Reaction to severe stress, unspecified: Secondary | ICD-10-CM | POA: Diagnosis not present

## 2021-11-16 MED ORDER — SERTRALINE HCL 50 MG PO TABS: 50.0000 mg | ORAL_TABLET | Freq: Every day | ORAL | 3 refills | Status: DC

## 2021-11-16 MED ORDER — TIRZEPATIDE 2.5 MG/0.5ML ~~LOC~~ SOAJ: 5.0000 mg | SUBCUTANEOUS | Status: DC

## 2021-11-16 NOTE — Progress Notes (Signed)
Established patient visit  I,April Miller,acting as a scribe for Lelon Huh, MD.,have documented all relevant documentation on the behalf of Lelon Huh, MD,as directed by  Lelon Huh, MD while in the presence of Lelon Huh, MD.   Patient: Jessica Fields   DOB: 04-Sep-1981   41 y.o. Female  MRN: [id number] Visit Date: 11/16/2021  Today's healthcare provider: Lelon Huh, MD   Chief Complaint  Patient presents with   Follow-up   Diabetes   Hypertension   Subjective    HPI  Diabetes Mellitus Type II, follow-up  Lab Results  Component Value Date   HGBA1C 6.5 (A) 10/05/2021   HGBA1C 6.3 (H) 12/19/2020   HGBA1C 6.7 (A) 08/19/2020   Last seen for diabetes 6 weeks ago.  Management since then includes; Try changing Ozempic to samples of tirzepatide [facility]) 2.5 MG/0.5ML Pen. She reports good compliance with treatment. She is not having side effects. none  Home blood sugar records: fasting range: not checking  Episodes of hypoglycemia? No none    Most Recent Eye Exam: 7 month ago at [facility]  --------------------------------------------------------------------------------------------------- Hypertension, follow-up  BP Readings from Last 3 Encounters:  11/16/21 128/78  10/05/21 (!) 163/83  12/19/20 (!) 160/76   Wt Readings from Last 3 Encounters:  11/16/21 (!) 432 lb (196 kg)  10/05/21 (!) 447 lb (202.8 kg)  12/19/20 (!) 435 lb 12.8 oz (197.7 kg)     She was last seen for hypertension 6 weeks ago.  BP at that visit was 163/83. Management since that visit includes; Uncontrolled. Changed lisinopril 10mg  to lisinopril-hydrochlorothiazide. She reports good compliance with treatment. She is not having side effects. none She is exercising. She is not adherent to low salt diet.   Outside blood pressures are not checking.  She does not smoke.  Use of agents associated with hypertension: none.    ---------------------------------------------------------------------------------------------------   Medications: Outpatient Medications Prior to Visit  Medication Sig   amLODipine (NORVASC) 10 MG tablet Take 1 tablet (10 mg total) by mouth daily.   celecoxib (CELEBREX) 200 MG capsule Take 1 capsule (200 mg total) by mouth 2 (two) times daily.   diclofenac sodium (VOLTAREN) 1 % GEL Apply 4 g topically 4 (four) times daily as needed.   fluticasone (FLONASE) 50 MCG/ACT nasal spray Place 2 sprays into both nostrils daily as needed.   lisinopril-hydrochlorothiazide (ZESTORETIC) 10-12.5 MG tablet Take 1 tablet by mouth daily.   metFORMIN (GLUCOPHAGE) 500 MG tablet Take 1 tablet (500 mg total) by mouth 3 (three) times daily with meals.   oxyCODONE-acetaminophen (PERCOCET) 7.5-325 MG tablet Take 1 tablet by mouth every 6 (six) hours as needed for severe pain.   promethazine (PHENERGAN) 25 MG tablet Take 1 tablet (25 mg total) by mouth every 8 (eight) hours as needed for nausea or vomiting.   sertraline (ZOLOFT) 50 MG tablet Take 1 tablet (50 mg total) by mouth daily.   tirzepatide [facility]) 2.5 MG/0.5ML Pen Inject 2.5 mg into the skin once a week.   No facility-administered medications prior to visit.    Review of Systems  Constitutional:  Negative for appetite change, chills, fatigue and fever.  Respiratory:  Negative for chest tightness and shortness of breath.   Cardiovascular:  Negative for chest pain and palpitations.  Gastrointestinal:  Negative for abdominal pain, nausea and vomiting.  Neurological:  Negative for dizziness and weakness.      Objective    BP 128/78 (BP Location: Right Wrist, Patient Position: Sitting,  Cuff Size: Large)    Pulse 100    Temp 98.5 F (36.9 C) (Temporal)    Resp 16    Ht 5\' 6"  (1.676 m)    Wt (!) 432 lb (196 kg)    SpO2 97%    BMI 69.73 kg/m  {Show previous vital signs (optional):23777}  Physical Exam   General: Appearance:    Severely obese  female in no acute distress  Eyes:    PERRL, conjunctiva/corneas clear, EOM's intact       Lungs:     Clear to auscultation bilaterally, respirations unlabored  Heart:    Tachycardic. Normal rhythm. No murmurs, rubs, or gallops.    MS:   All extremities are intact.    Neurologic:   Awake, alert, oriented x 3. No apparent focal neurological defect.         Assessment & Plan     1. Type 2 diabetes mellitus with morbid obesity ([place]) Doing well wit addition of Mounjara which she is tolerating much better than Ozempic. Is going to try increasing to tirzepatide [facility]) 2.5 MG/0.5ML Pen; Inject 5 mg into the skin once a week.  She will call and let my know if she wants to increase to 5mg  dose pens when current 2.5mg  pens run out.   2. Situational stress Dong very well on  sertraline (ZOLOFT) 50 MG tablet; Take 1 tablet (50 mg total) by mouth daily.  Dispense: 90 tablet; Refill: 3  3. Morbid obesity ([place]) She feels she is eating much smaller portions lately and will continue to work on limiting calories.   Future Appointments  Date Time Provider [place]  02/15/2022  1:40 PM Caryn Section Kirstie Peri, MD [facility]         The entirety of the information documented in the History of Present Illness, Review of Systems and Physical Exam were personally obtained by me. Portions of this information were initially documented by the CMA and reviewed by me for thoroughness and accuracy.     Lelon Huh, MD  [facility] [phone] (phone) [phone] (fax)  [place]

## 2021-11-16 NOTE — Patient Instructions (Signed)
Please review the attached list of medications and notify my office if there are any errors.   Please bring all of your medications to every appointment so we can make sure that our medication list is the same as yours.   You can finish off your Mounjaro pens by taking 2 shots a week to make 5mg  per week.   Let me know if you want to change to 5mg  dose pens or stick with the 2.5mg  when your current supply of pens runs out.

## 2021-12-09 ENCOUNTER — Other Ambulatory Visit: Payer: Self-pay | Admitting: Family Medicine

## 2021-12-09 DIAGNOSIS — R1011 Right upper quadrant pain: Secondary | ICD-10-CM

## 2021-12-10 ENCOUNTER — Telehealth: Payer: Self-pay | Admitting: Family Medicine

## 2021-12-10 DIAGNOSIS — E119 Type 2 diabetes mellitus without complications: Secondary | ICD-10-CM

## 2021-12-10 MED ORDER — OXYCODONE-ACETAMINOPHEN 7.5-325 MG PO TABS: 1.0000 | ORAL_TABLET | Freq: Four times a day (QID) | ORAL | 0 refills | Status: DC | PRN

## 2021-12-10 NOTE — Telephone Encounter (Signed)
Pt called in asking if she is stilll supposed ot be taking metformin since there is no more rfills. Please call back

## 2021-12-11 MED ORDER — METFORMIN HCL 500 MG PO TABS: 500.0000 mg | ORAL_TABLET | Freq: Three times a day (TID) | ORAL | 4 refills | Status: DC

## 2021-12-11 NOTE — Telephone Encounter (Signed)
Yes, she is supposed to be taking metformin, please see which pharmacy she would like  prescription sent to.

## 2021-12-11 NOTE — Telephone Encounter (Signed)
Pt called, gave information advice from [place], CMA. Rx was sent to [facility].

## 2021-12-11 NOTE — Telephone Encounter (Signed)
Tried calling patient. Left message to call back. Okay for [facility] triage to advise and send prescription into which ever pharmacy patient uses.

## 2022-01-05 ENCOUNTER — Other Ambulatory Visit: Payer: Self-pay | Admitting: Family Medicine

## 2022-01-05 DIAGNOSIS — R1011 Right upper quadrant pain: Secondary | ICD-10-CM

## 2022-01-06 NOTE — Telephone Encounter (Signed)
Last refill: 12/10/2021 #120 with 0 refills ? ?Last office visit:11/16/2021 ?Next office visit: 02/15/2022 ? ?

## 2022-01-07 MED ORDER — OXYCODONE-ACETAMINOPHEN 7.5-325 MG PO TABS: 1.0000 | ORAL_TABLET | Freq: Four times a day (QID) | ORAL | 0 refills | Status: DC | PRN

## 2022-01-11 ENCOUNTER — Other Ambulatory Visit: Payer: Self-pay | Admitting: Family Medicine

## 2022-01-11 ENCOUNTER — Telehealth: Payer: Self-pay | Admitting: Family Medicine

## 2022-01-11 ENCOUNTER — Encounter: Payer: Self-pay | Admitting: Family Medicine

## 2022-01-11 DIAGNOSIS — R1011 Right upper quadrant pain: Secondary | ICD-10-CM

## 2022-01-11 NOTE — Telephone Encounter (Signed)
oxyCODONE-acetaminophen (PERCOCET) 7.5-325 MG tablet is not available at normal preferred pharmacy pt needs refill sent to  ?[id number] [place], [place] - [address] AT [facility] Phone:  [phone]  ?Fax:  [phone]  ?  ? ?

## 2022-01-11 NOTE — Telephone Encounter (Signed)
Pt was following up, stated she has called multiple times and also sent a MyChart message, pt does not know what else to do to get her medication, please advise.  ?

## 2022-01-12 ENCOUNTER — Other Ambulatory Visit: Payer: Self-pay

## 2022-01-12 ENCOUNTER — Encounter: Payer: Self-pay | Admitting: Family Medicine

## 2022-01-12 DIAGNOSIS — R1011 Right upper quadrant pain: Secondary | ICD-10-CM

## 2022-01-12 MED ORDER — OXYCODONE-ACETAMINOPHEN 7.5-325 MG PO TABS: 1.0000 | ORAL_TABLET | Freq: Four times a day (QID) | ORAL | 0 refills | Status: DC | PRN

## 2022-01-12 NOTE — Telephone Encounter (Signed)
Medication refill sent to pharmacy on 01/12/22.  ?

## 2022-01-12 NOTE — Telephone Encounter (Signed)
Patient called and said she had asked for her Rx to be sent to [facility] because [facility] has been out of them ?

## 2022-02-07 ENCOUNTER — Other Ambulatory Visit: Payer: Self-pay | Admitting: Family Medicine

## 2022-02-07 DIAGNOSIS — R1011 Right upper quadrant pain: Secondary | ICD-10-CM

## 2022-02-08 MED ORDER — OXYCODONE-ACETAMINOPHEN 7.5-325 MG PO TABS: 1.0000 | ORAL_TABLET | Freq: Four times a day (QID) | ORAL | 0 refills | Status: DC | PRN

## 2022-02-15 ENCOUNTER — Ambulatory Visit: Payer: 59 | Admitting: Family Medicine

## 2022-02-16 ENCOUNTER — Other Ambulatory Visit: Payer: Self-pay | Admitting: Family Medicine

## 2022-02-16 DIAGNOSIS — M7661 Achilles tendinitis, right leg: Secondary | ICD-10-CM

## 2022-02-16 DIAGNOSIS — I10 Essential (primary) hypertension: Secondary | ICD-10-CM

## 2022-03-02 ENCOUNTER — Ambulatory Visit: Payer: 59 | Admitting: Family Medicine

## 2022-03-08 ENCOUNTER — Other Ambulatory Visit: Payer: Self-pay | Admitting: Family Medicine

## 2022-03-08 DIAGNOSIS — R1011 Right upper quadrant pain: Secondary | ICD-10-CM

## 2022-03-09 MED ORDER — OXYCODONE-ACETAMINOPHEN 7.5-325 MG PO TABS: 1.0000 | ORAL_TABLET | Freq: Four times a day (QID) | ORAL | 0 refills | Status: DC | PRN

## 2022-03-12 ENCOUNTER — Encounter: Payer: Self-pay | Admitting: Family Medicine

## 2022-03-16 ENCOUNTER — Encounter: Payer: Self-pay | Admitting: Family Medicine

## 2022-03-16 ENCOUNTER — Ambulatory Visit: Payer: 59 | Admitting: Family Medicine

## 2022-03-16 ENCOUNTER — Other Ambulatory Visit: Payer: Self-pay

## 2022-03-16 DIAGNOSIS — I10 Essential (primary) hypertension: Secondary | ICD-10-CM | POA: Diagnosis not present

## 2022-03-16 DIAGNOSIS — R809 Proteinuria, unspecified: Secondary | ICD-10-CM | POA: Diagnosis not present

## 2022-03-16 DIAGNOSIS — E1169 Type 2 diabetes mellitus with other specified complication: Secondary | ICD-10-CM | POA: Diagnosis not present

## 2022-03-16 DIAGNOSIS — R490 Dysphonia: Secondary | ICD-10-CM

## 2022-03-16 MED ORDER — OMEPRAZOLE 20 MG PO CPDR: 20.0000 mg | DELAYED_RELEASE_CAPSULE | Freq: Every day | ORAL | 3 refills | Status: DC

## 2022-03-16 MED ORDER — TIRZEPATIDE 5 MG/0.5ML ~~LOC~~ SOAJ: 5.0000 mg | SUBCUTANEOUS | 1 refills | Status: DC

## 2022-03-16 NOTE — Progress Notes (Signed)
I,Jana Robinson,acting as a scribe for Lelon Huh, MD.,have documented all relevant documentation on the behalf of Lelon Huh, MD,as directed by  Lelon Huh, MD while in the presence of Lelon Huh, MD.     Established patient visit   Patient: Jessica Fields   DOB: [birth year]   41 y.o. Female  MRN: [id number] Visit Date: 03/16/2022  Today's healthcare provider: Lelon Huh, MD   Chief Complaint  Patient presents with   Diabetes   Hypertension   Subjective    HPI  Diabetes Mellitus Type II, Follow-up  Lab Results  Component Value Date   HGBA1C 6.5 (A) 10/05/2021   HGBA1C 6.3 (H) 12/19/2020   HGBA1C 6.7 (A) 08/19/2020   Wt Readings from Last 3 Encounters:  03/16/22 (!) 427 lb (193.7 kg)  11/16/21 (!) 432 lb (196 kg)  10/05/21 (!) 447 lb (202.8 kg)   Last seen for diabetes 4 months ago.  Management since then includes continue current medication and try Increase to tirzepatide 2.5 mg.  Inject 5 mg into skin once a week. Patient states the pharmacy is giving her 2.5 mg and they have no record of change.  She reports excellent compliance with treatment. Is tolerating the 2.5mg  dose well with no adverse effects.  She is not having side effects.  Symptoms: No fatigue No foot ulcerations  Yes appetite changes No nausea  No paresthesia of the feet  No polydipsia  No polyuria No visual disturbances   No vomiting      Home blood sugar records: rarely checks   Episodes of hypoglycemia? No   Current insulin regiment: Most Recent Eye Exam: just over 1 yr ago/Patient to schedule  Current exercise: walking daily  Current diet habits: well balanced  Pertinent Labs: Lab Results  Component Value Date   CHOL 189 12/19/2020   HDL 53 12/19/2020   LDLCALC 111 (H) 12/19/2020   TRIG 144 12/19/2020   CHOLHDL 3.6 12/19/2020   Lab Results  Component Value Date   NA 140 12/19/2020   K 4.6 12/19/2020   CREATININE 0.90 12/19/2020   EGFR 82 12/19/2020   LABMICR 107.8  10/05/2021     ---------------------------------------------------------------------------------------------------  Follow up for hypertension  The patient was last seen for this 4 months ago. Changes made at last visit include. Uncontrolled. Changed lisinopril 10mg  to lisinopril-hydrochlorothiazide She reports excellent compliance with treatment. She feels that condition is Improved. She is not having side effects.   ----------------------------------------------------------------------------------------- Follow up for situational stress   The patient was last seen for this 4 months ago. Changes made at last visit include continue sertraline 50 mg daily.  She reports excellent compliance with treatment. She feels that condition is Improved. Depends on the day.  She is not having side effects.   ----------------------------------------------------------------------------------------- [place] Office Visit from 03/16/2022 in [place]  PHQ-2 Total Score 2        Medications: Outpatient Medications Prior to Visit  Medication Sig   amLODipine (NORVASC) 10 MG tablet Take 1 tablet (10 mg total) by mouth daily.   celecoxib (CELEBREX) 200 MG capsule TAKE 1 CAPSULE BY MOUTH TWICE DAILY   diclofenac sodium (VOLTAREN) 1 % GEL Apply 4 g topically 4 (four) times daily as needed.   fluticasone (FLONASE) 50 MCG/ACT nasal spray Place 2 sprays into both nostrils daily as needed.   lisinopril-hydrochlorothiazide (ZESTORETIC) 10-12.5 MG tablet TAKE 1 TABLET BY MOUTH EVERY DAY   metFORMIN (GLUCOPHAGE) 500 MG tablet Take 1 tablet (500 mg  total) by mouth 3 (three) times daily with meals.   oxyCODONE-acetaminophen (PERCOCET) 7.5-325 MG tablet Take 1 tablet by mouth every 6 (six) hours as needed for severe pain.   sertraline (ZOLOFT) 50 MG tablet Take 1 tablet (50 mg total) by mouth daily.   tirzepatide [facility]) 2.5 MG/0.5ML Pen Inject 5 mg into the skin once a week.    promethazine (PHENERGAN) 25 MG tablet Take 1 tablet (25 mg total) by mouth every 8 (eight) hours as needed for nausea or vomiting. (Patient not taking: Reported on 03/16/2022)   No facility-administered medications prior to visit.    Review of Systems  Constitutional:  Negative for appetite change, chills, fatigue and fever.  Respiratory:  Negative for chest tightness and shortness of breath.   Cardiovascular:  Negative for chest pain and palpitations.  Gastrointestinal:  Negative for abdominal pain, nausea and vomiting.  Neurological:  Negative for dizziness and weakness.      Objective    BP 138/75 (BP Location: Right Arm, Patient Position: Sitting, Cuff Size: Large)   Pulse 90   Temp 98.8 F (37.1 C) (Oral)   Resp 16   Ht $R'5\' 6"'VD$  (1.676 m)   Wt (!) 427 lb (193.7 kg)   SpO2 100%   BMI 68.92 kg/m  BP Readings from Last 3 Encounters:  03/16/22 138/75  11/16/21 128/78  10/05/21 (!) 163/83   Wt Readings from Last 3 Encounters:  03/16/22 (!) 427 lb (193.7 kg)  11/16/21 (!) 432 lb (196 kg)  10/05/21 (!) 447 lb (202.8 kg)    Physical Exam   General Appearance:    Severely obese female, alert, cooperative, in no acute distress  HENT:   ENT exam normal, no neck nodes or sinus tenderness  Eyes:    PERRL, conjunctiva/corneas clear, EOM's intact       Lungs:     Clear to auscultation bilaterally, respirations unlabored  Heart:    Normal heart rate. Normal rhythm. No murmurs, rubs, or gallops.    Neurologic:   Awake, alert, oriented x 3. No apparent focal neurological           defect.         Assessment & Plan     1. Type 2 diabetes mellitus with morbid obesity ([place]) Doing well with 2.$RemoveBef'5mg'HfIjdYTvGl$  tirzepatde with no adverse reactions. Increase  tirzepatide [facility]) to 5 MG/0.5ML Pen; Inject 5 mg into the skin once a week.  Dispense: 6 mL; Refill: 1  Consider reducing metformin if a1c drops below 6.  - Comprehensive metabolic panel - Hemoglobin A1c  2. Morbid obesity ([place]) Losing  weight consistently on current medication regiment.   3. Albuminuria Will continue to check annually. Consider SGLT-1 inhibitor if not improving when checked later this year.   4. Primary hypertension Well controlled.  Continue current medications.   - Lipid panel - TSH - CBC with Differential/Platelet  5. Hoarseness No other symptoms. May be related to GERD. Try  omeprazole (PRILOSEC) 20 MG capsule; Take 1 capsule (20 mg total) by mouth daily.  Dispense: 30 capsule; Refill: 3   Consider ENT referral if not clearing up in a few weeks.      The entirety of the information documented in the History of Present Illness, Review of Systems and Physical Exam were personally obtained by me. Portions of this information were initially documented by the CMA and reviewed by me for thoroughness and accuracy.     Lelon Huh, MD  [facility] [phone] (phone)  [phone] (fax)  [place]

## 2022-03-16 NOTE — Telephone Encounter (Signed)
LOV 11/16/21 NOV today

## 2022-03-17 LAB — COMPREHENSIVE METABOLIC PANEL
ALT: 34 IU/L — ABNORMAL HIGH (ref 0–32)
AST: 48 IU/L — ABNORMAL HIGH (ref 0–40)
Albumin/Globulin Ratio: 1.1 — ABNORMAL LOW (ref 1.2–2.2)
Albumin: 4.1 g/dL (ref 3.8–4.8)
Alkaline Phosphatase: 87 IU/L (ref 44–121)
BUN/Creatinine Ratio: 16 (ref 9–23)
BUN: 15 mg/dL (ref 6–24)
Bilirubin Total: 0.2 mg/dL (ref 0.0–1.2)
CO2: 24 mmol/L (ref 20–29)
Calcium: 9.6 mg/dL (ref 8.7–10.2)
Chloride: 100 mmol/L (ref 96–106)
Creatinine, Ser: 0.92 mg/dL (ref 0.57–1.00)
Globulin, Total: 3.6 g/dL (ref 1.5–4.5)
Glucose: 148 mg/dL — ABNORMAL HIGH (ref 70–99)
Potassium: 4.5 mmol/L (ref 3.5–5.2)
Sodium: 138 mmol/L (ref 134–144)
Total Protein: 7.7 g/dL (ref 6.0–8.5)
eGFR: 80 mL/min/{1.73_m2} (ref >59)

## 2022-03-17 LAB — CBC WITH DIFFERENTIAL/PLATELET
Basophils Absolute: 0.1 10*3/uL (ref 0.0–0.2)
Basos: 1 %
EOS (ABSOLUTE): 0.4 10*3/uL (ref 0.0–0.4)
Eos: 6 %
Hematocrit: 35.9 % (ref 34.0–46.6)
Hemoglobin: 11.5 g/dL (ref 11.1–15.9)
Immature Grans (Abs): 0 10*3/uL (ref 0.0–0.1)
Immature Granulocytes: 0 %
Lymphocytes Absolute: 1.4 10*3/uL (ref 0.7–3.1)
Lymphs: 20 %
MCH: 26.6 pg (ref 26.6–33.0)
MCHC: 32 g/dL (ref 31.5–35.7)
MCV: 83 fL (ref 79–97)
Monocytes Absolute: 0.5 10*3/uL (ref 0.1–0.9)
Monocytes: 7 %
Neutrophils Absolute: 4.5 10*3/uL (ref 1.4–7.0)
Neutrophils: 66 %
Platelets: 308 10*3/uL (ref 150–450)
RBC: 4.32 x10E6/uL (ref 3.77–5.28)
RDW: 14.3 % (ref 11.7–15.4)
WBC: 6.9 10*3/uL (ref 3.4–10.8)

## 2022-03-17 LAB — LIPID PANEL
Chol/HDL Ratio: 5 ratio — ABNORMAL HIGH (ref 0.0–4.4)
Cholesterol, Total: 175 mg/dL (ref 100–199)
HDL: 35 mg/dL — ABNORMAL LOW (ref >39)
LDL Chol Calc (NIH): 96 mg/dL (ref 0–99)
Triglycerides: 259 mg/dL — ABNORMAL HIGH (ref 0–149)
VLDL Cholesterol Cal: 44 mg/dL — ABNORMAL HIGH (ref 5–40)

## 2022-03-17 LAB — TSH: TSH: 0.711 u[IU]/mL (ref 0.450–4.500)

## 2022-03-17 LAB — HEMOGLOBIN A1C
Est. average glucose Bld gHb Est-mCnc: 212 mg/dL
Hgb A1c MFr Bld: 9 % — ABNORMAL HIGH (ref 4.8–5.6)

## 2022-04-05 ENCOUNTER — Other Ambulatory Visit: Payer: Self-pay | Admitting: Family Medicine

## 2022-04-05 DIAGNOSIS — R1011 Right upper quadrant pain: Secondary | ICD-10-CM

## 2022-04-08 MED ORDER — OXYCODONE-ACETAMINOPHEN 7.5-325 MG PO TABS: 1.0000 | ORAL_TABLET | Freq: Four times a day (QID) | ORAL | 0 refills | Status: DC | PRN

## 2022-05-05 ENCOUNTER — Other Ambulatory Visit: Payer: Self-pay | Admitting: Family Medicine

## 2022-05-05 DIAGNOSIS — R1011 Right upper quadrant pain: Secondary | ICD-10-CM

## 2022-05-06 MED ORDER — OXYCODONE-ACETAMINOPHEN 7.5-325 MG PO TABS: 1.0000 | ORAL_TABLET | Freq: Four times a day (QID) | ORAL | 0 refills | Status: DC | PRN

## 2022-06-03 ENCOUNTER — Other Ambulatory Visit: Payer: Self-pay | Admitting: Family Medicine

## 2022-06-03 DIAGNOSIS — R1011 Right upper quadrant pain: Secondary | ICD-10-CM

## 2022-06-06 MED ORDER — OXYCODONE-ACETAMINOPHEN 7.5-325 MG PO TABS: 1.0000 | ORAL_TABLET | Freq: Four times a day (QID) | ORAL | 0 refills | Status: DC | PRN

## 2022-06-16 ENCOUNTER — Ambulatory Visit
Admission: EM | Admit: 2022-06-16 | Discharge: 2022-06-16 | Disposition: A | Discharge status: Home / Self Care | Payer: 59

## 2022-06-16 DIAGNOSIS — R030 Elevated blood-pressure reading, without diagnosis of hypertension: Secondary | ICD-10-CM | POA: Diagnosis not present

## 2022-06-16 DIAGNOSIS — J069 Acute upper respiratory infection, unspecified: Secondary | ICD-10-CM

## 2022-06-16 NOTE — Discharge Instructions (Addendum)
Follow up with your primary care provider if your symptoms are not improving.     

## 2022-06-16 NOTE — ED Provider Notes (Signed)
[facility]   [id number] 06/16/22 Arrival Time: 1532  ASSESSMENT & PLAN:  1. Viral upper respiratory illness   2. Elevated blood pressure reading     See AVS for discharge instructions.  No orders of the defined types were placed in this encounter.  Presents with complaint of cough and SOB, worse at night. SaO2 with some reduce saturation in the office. She reports 89% at home. On exam, auscultated end-inspiration wheezes in L upper and bilateral lower lobes. She reports no hx of asthma or other chronic respiratory disease. She was evaluated by telehealth earlier today and prescribed albuterol for the SOB and tessalon for the cough. She has not yet picked up these medications. I asked her to pick these up and start. Provided some education regarding use of the albuterol inhaler. Asked her to try the tessalon, especially at nighttime. Concerned about prescribing stronger cough suppressant d/t sedation and risk of worsening O2 saturation. Concerned about prescribing inhaled or systemic steroid given DM2 with recently elevated A1C.  Continue treatment plan previously prescribed. OTC symptom care as needed.  Ensure adequate fluid intake and rest.  May f/u with PCP or here as needed.  Reviewed expectations re: course of current medical issues. Questions answered. Outlined signs and symptoms indicating need for more acute intervention. Patient verbalized understanding. After Visit Summary given.   SUBJECTIVE: History from: patient.  Jessica Fields is a 41 y.o. female who presents with complaint of nasal congestion, post-nasal drainage, and a persistent dry cough; with sore throat. Onset abrupt, several days ago; without fatigue and without body aches. SOB: mild, worse at night. Wheezing: none. Fever: absent. Overall normal PO intake without n/v. Known sick contacts or COVID-19 exposure: no. No specific or significant aggravating or alleviating factors reported. OTC treatment:  none.  Received flu shot this year: no.  Social History   Tobacco Use  Smoking Status Never  Smokeless Tobacco Never    ROS: As per HPI.   OBJECTIVE:  Vitals:   06/16/22 1544 06/16/22 1546  BP: (!) 179/82   Pulse: (!) 106   Resp: 18   Temp: 97.9 F (36.6 C)   SpO2: 94%   Weight:  (!) 390 lb (176.9 kg)  Height:  5\' 6"  (1.676 m)     General appearance: alert; appears fatigued HEENT: nasal congestion; clear runny nose; throat irritation secondary to post-nasal drainage Neck: supple without LAD CV: RRR Lungs: unlabored respirations, symmetrical air entry with wheezing present in L upper and lower lobes bilaterally; cough: mild Abd: soft Ext: no LE edema Skin: warm and dry Psychological: alert and cooperative; normal mood and affect  Imaging: No results found.  No Known Allergies  Past Medical History:  Diagnosis Date   History of chicken pox    History of cholecystitis    Family History  Problem Relation Age of Onset   Breast cancer Neg Hx    Colon cancer Neg Hx    Social History   Socioeconomic History   Marital status: Married    Spouse name: Not on file   Number of children: 0   Years of education: Not on file   Highest education level: Not on file  Occupational History   Occupation:  Tobacco Use   Smoking status: Never   Smokeless tobacco: Never  Substance and Sexual Activity   Alcohol use: Yes    Alcohol/week: 0.0 standard drinks of alcohol    Comment: occasional use   Drug use: No  Sexual activity: Not on file  Other Topics Concern   Not on file  Social History Narrative   Not on file   Social Determinants of Health   Financial Resource Strain: Not on file  Food Insecurity: Not on file  Transportation Needs: Not on file  Physical Activity: Not on file  Stress: Not on file  Social Connections: Not on file  Intimate Partner Violence: Not on file            Charma Igo, [place] 06/16/22 1630

## 2022-06-16 NOTE — ED Triage Notes (Signed)
Patient to Urgent Care with complaints of sore throat/ cough/ and shortness of breath.  Reports symptoms initially started with a sore throat on Friday. Cough is dry and unproductive. Constant nasal drainage.   Patient did a tele-health visit today. Prescriptions for inhaler and cough mediation (tessalon/ albuterol). Reports that her O2 sats have been low and was advised to be seen in person.

## 2022-06-20 NOTE — Telephone Encounter (Signed)
encounter opened in error, closed for administrative reasons.

## 2022-06-22 ENCOUNTER — Telehealth: Payer: Self-pay | Admitting: Family Medicine

## 2022-06-22 DIAGNOSIS — B86 Scabies: Secondary | ICD-10-CM

## 2022-06-22 MED ORDER — PERMETHRIN 5 % EX CREA: 1.0000 | TOPICAL_CREAM | Freq: Once | CUTANEOUS | 1 refills | Status: AC

## 2022-06-22 NOTE — Telephone Encounter (Signed)
Patients husband seen today via virtual visit with suspected scabies. This patient has identical rash that started at about the same time.

## 2022-07-03 ENCOUNTER — Other Ambulatory Visit: Payer: Self-pay | Admitting: Family Medicine

## 2022-07-03 DIAGNOSIS — M7661 Achilles tendinitis, right leg: Secondary | ICD-10-CM

## 2022-07-04 ENCOUNTER — Other Ambulatory Visit: Payer: Self-pay | Admitting: Family Medicine

## 2022-07-04 DIAGNOSIS — R1011 Right upper quadrant pain: Secondary | ICD-10-CM

## 2022-07-05 MED ORDER — OXYCODONE-ACETAMINOPHEN 7.5-325 MG PO TABS: 1.0000 | ORAL_TABLET | Freq: Four times a day (QID) | ORAL | 0 refills | Status: DC | PRN

## 2022-08-02 ENCOUNTER — Other Ambulatory Visit: Payer: Self-pay | Admitting: Family Medicine

## 2022-08-02 DIAGNOSIS — R1011 Right upper quadrant pain: Secondary | ICD-10-CM

## 2022-08-03 MED ORDER — OXYCODONE-ACETAMINOPHEN 7.5-325 MG PO TABS: 1.0000 | ORAL_TABLET | Freq: Four times a day (QID) | ORAL | 0 refills | Status: DC | PRN

## 2022-09-01 ENCOUNTER — Other Ambulatory Visit: Payer: Self-pay | Admitting: Family Medicine

## 2022-09-01 DIAGNOSIS — I10 Essential (primary) hypertension: Secondary | ICD-10-CM

## 2022-09-01 DIAGNOSIS — R1011 Right upper quadrant pain: Secondary | ICD-10-CM

## 2022-09-02 NOTE — Telephone Encounter (Signed)
Requested Prescriptions  Pending Prescriptions Disp Refills   lisinopril-hydrochlorothiazide (ZESTORETIC) 10-12.5 MG tablet [Pharmacy Med Name: LISINOPRIL-HCTZ 10/12.5MG TABLETS] 90 tablet 0    Sig: TAKE 1 TABLET BY MOUTH EVERY DAY     Cardiovascular:  ACEI + Diuretic Combos Failed - 09/01/2022 10:13 PM      Failed - Last BP in normal range    BP Readings from Last 1 Encounters:  06/16/22 (!) 179/82         Passed - Na in normal range and within 180 days    Sodium  Date Value Ref Range Status  03/16/2022 138 134 - 144 mmol/L Final  02/08/2015 138 mmol/L Final    Comment:    135-145 NOTE: New Reference Range  12/24/14          Passed - K in normal range and within 180 days    Potassium  Date Value Ref Range Status  03/16/2022 4.5 3.5 - 5.2 mmol/L Final  02/08/2015 3.6 mmol/L Final    Comment:    3.5-5.1 NOTE: New Reference Range  12/24/14          Passed - Cr in normal range and within 180 days    Creat  Date Value Ref Range Status  07/06/2017 0.96 0.50 - 1.10 mg/dL Final   Creatinine, Ser  Date Value Ref Range Status  03/16/2022 0.92 0.57 - 1.00 mg/dL Final         Passed - eGFR is 30 or above and within 180 days    GFR, Est African American  Date Value Ref Range Status  07/06/2017 87 > OR = 60 mL/min/1.20m Final   GFR calc Af Amer  Date Value Ref Range Status  09/25/2019 96 >59 mL/min/1.73 Final   GFR, Est Non African American  Date Value Ref Range Status  07/06/2017 75 > OR = 60 mL/min/1.732mFinal   GFR calc non Af Amer  Date Value Ref Range Status  09/25/2019 84 >59 mL/min/1.73 Final   eGFR  Date Value Ref Range Status  03/16/2022 80 >59 mL/min/1.73 Final         Passed - Patient is not pregnant      Passed - Valid encounter within last 6 months    Recent Outpatient Visits           5 months ago [facility]   9 months ago Type 2 [facility]  [facility]   11 months ago Chronic, continuous use of opioids   [facility]   1 year [facility]  [facility]   2 years ago Type 2 diabetes mellitus with morbid [facility] ([facility]

## 2022-09-03 ENCOUNTER — Other Ambulatory Visit: Payer: Self-pay | Admitting: Family Medicine

## 2022-09-03 DIAGNOSIS — R1011 Right upper quadrant pain: Secondary | ICD-10-CM

## 2022-09-03 MED ORDER — OXYCODONE-ACETAMINOPHEN 7.5-325 MG PO TABS: 1.0000 | ORAL_TABLET | Freq: Four times a day (QID) | ORAL | 0 refills | Status: DC | PRN

## 2022-09-03 NOTE — Telephone Encounter (Signed)
Medication Refill - Medication: oxyCODONE-acetaminophen (PERCOCET) 7.5-325 MG tablet   Has the patient contacted their pharmacy? Yes.   Pt told to contact provider  Preferred Pharmacy (with phone number or street name):  [facility][id number] [place], [place] - [address] AT [facility] [address] Phone: [phone]  Fax: [phone]     Has the patient been seen for an appointment in the last year OR does the patient have an upcoming appointment? Yes.    Agent: Please be advised that RX refills may take up to 3 business days. We ask that you follow-up with your pharmacy.

## 2022-09-03 NOTE — Telephone Encounter (Signed)
Requested medication (s) are due for refill today - provider review   Requested medication (s) are on the active medication list -yes  Future visit scheduled -yes  Last refill: 08/03/22 #120  Notes to clinic: non delegated Rx- refused- needs appointment  Requested Prescriptions  Pending Prescriptions Disp Refills   oxyCODONE-acetaminophen (PERCOCET) 7.5-325 MG tablet 120 tablet 0    Sig: Take 1 tablet by mouth every 6 (six) hours as needed for severe pain.     Not Delegated - Analgesics:  Opioid Agonist Combinations Failed - 09/03/2022  9:44 AM      Failed - This refill cannot be delegated      Failed - Urine Drug Screen completed in last 360 days      Failed - Valid encounter within last 3 months    Recent Outpatient Visits           5 months ago Type 2 diabetes mellitus with morbid obesity [facility])   [facility] Malva Limes, MD   9 months ago Type 2 diabetes mellitus with morbid obesity [facility])   [facility] Malva Limes, MD   11 months ago Chronic, continuous use of opioids   [facility] Malva Limes, MD   1 year ago Morbid obesity [facility])   [facility] Malva Limes, MD   2 years ago Type 2 diabetes mellitus with morbid obesity [facility])   [facility] Malva Limes, MD       Future Appointments             In 2 weeks Fisher, Demetrios Isaacs, MD [facility]               Requested Prescriptions  Pending Prescriptions Disp Refills   oxyCODONE-acetaminophen (PERCOCET) 7.5-325 MG tablet 120 tablet 0    Sig: Take 1 tablet by mouth every 6 (six) hours as needed for severe pain.     Not Delegated - Analgesics:  Opioid Agonist Combinations Failed - 09/03/2022  9:44 AM      Failed - This refill cannot be delegated      Failed - Urine Drug Screen completed in last 360 days      Failed - Valid encounter within last 3 months    Recent Outpatient Visits           5  months ago Type 2 diabetes mellitus with morbid obesity [facility])   [facility] Malva Limes, MD   9 months ago Type 2 diabetes mellitus with morbid obesity [facility])   [facility], [facility]   11 months ago Chronic, continuous use of opioids   [facility]va Limes, MD   1 year ago Morbid obesity [facility])   [facility] Malva Limes, MD   2 years ago Type 2 diabetes mellitus with morbid obesity [facility])   [facility] Malva Limes, MD       Future Appointments             In 2 weeks Fisher, Demetrios Isaacs, MD [facility], [facility]

## 2022-09-20 ENCOUNTER — Ambulatory Visit: Payer: 59 | Admitting: Family Medicine

## 2022-09-30 ENCOUNTER — Other Ambulatory Visit: Payer: Self-pay | Admitting: Family Medicine

## 2022-09-30 DIAGNOSIS — R1011 Right upper quadrant pain: Secondary | ICD-10-CM

## 2022-10-01 ENCOUNTER — Other Ambulatory Visit: Payer: Self-pay | Admitting: Family Medicine

## 2022-10-01 DIAGNOSIS — R1011 Right upper quadrant pain: Secondary | ICD-10-CM

## 2022-10-01 MED ORDER — OXYCODONE-ACETAMINOPHEN 7.5-325 MG PO TABS: 1.0000 | ORAL_TABLET | Freq: Four times a day (QID) | ORAL | 0 refills | Status: DC | PRN

## 2022-10-04 NOTE — Progress Notes (Signed)
I,April Miller,acting as a scribe for Lelon Huh, MD.,have documented all relevant documentation on the behalf of Lelon Huh, MD,as directed by  Lelon Huh, MD while in the presence of Lelon Huh, MD.   Established patient visit   Patient: Jessica Fields   DOB: 1981/01/07   40 y.o. Female  MRN: [id number] Visit Date: 10/06/2022  Today's healthcare provider: Lelon Huh, MD   Chief Complaint  Patient presents with   Follow-up   Diabetes   Subjective    HPI  Diabetes Mellitus Type II, follow-up  Lab Results  Component Value Date   HGBA1C 9.0 (H) 03/16/2022   HGBA1C 6.5 (A) 10/05/2021   HGBA1C 6.3 (H) 12/19/2020   Last seen for diabetes 7 months ago.  Management since then includes continuing the same treatment.  Home blood sugar records: fasting range: 102 Most Recent Eye Exam: within last year  --------------------------------------------------------------------------------------------------- Hypertension, follow-up  BP Readings from Last 3 Encounters:  10/06/22 125/81  06/16/22 (!) 179/82  03/16/22 138/75   Wt Readings from Last 3 Encounters:  10/06/22 (!) 382 lb (173.3 kg)  06/16/22 (!) 390 lb (176.9 kg)  03/16/22 (!) 427 lb (193.7 kg)     She was last seen for hypertension 7 months ago.  Management since that visit includes Well controlled.  Continue current medications.   .  Outside blood pressures are checks occasionally.  ---------------------------------------------------------------------------------------------------   Medications: Outpatient Medications Prior to Visit  Medication Sig   amLODipine (NORVASC) 10 MG tablet Take 1 tablet (10 mg total) by mouth daily.   celecoxib (CELEBREX) 200 MG capsule TAKE 1 CAPSULE BY MOUTH TWICE DAILY   diclofenac sodium (VOLTAREN) 1 % GEL Apply 4 g topically 4 (four) times daily as needed.   fluticasone (FLONASE) 50 MCG/ACT nasal spray Place 2 sprays into both nostrils daily as needed.    lisinopril-hydrochlorothiazide (ZESTORETIC) 10-12.5 MG tablet TAKE 1 TABLET BY MOUTH EVERY DAY   metFORMIN (GLUCOPHAGE) 500 MG tablet Take 1 tablet (500 mg total) by mouth 3 (three) times daily with meals.   omeprazole (PRILOSEC) 20 MG capsule Take 1 capsule (20 mg total) by mouth daily.   oxyCODONE-acetaminophen (PERCOCET) 7.5-325 MG tablet Take 1 tablet by mouth every 6 (six) hours as needed for severe pain.   promethazine (PHENERGAN) 25 MG tablet Take 1 tablet (25 mg total) by mouth every 8 (eight) hours as needed for nausea or vomiting.   sertraline (ZOLOFT) 50 MG tablet Take 1 tablet (50 mg total) by mouth daily.   tirzepatide [facility]) 5 MG/0.5ML Pen Inject 5 mg into the skin once a week.   No facility-administered medications prior to visit.    Review of Systems  Constitutional:  Negative for appetite change, chills, fatigue and fever.  Respiratory:  Negative for chest tightness and shortness of breath.   Cardiovascular:  Negative for chest pain and palpitations.  Gastrointestinal:  Negative for abdominal pain, nausea and vomiting.  Neurological:  Negative for dizziness and weakness.       Objective    BP 125/81 (BP Location: Left Arm, Patient Position: Sitting, Cuff Size: Large)   Pulse 68   Resp 16   Wt (!) 382 lb (173.3 kg)   SpO2 98%   BMI 61.66 kg/m    Physical Exam  General appearance: Severely obese female, cooperative and in no acute distress Head: Normocephalic, without obvious abnormality, atraumatic Respiratory: Respirations even and unlabored, normal respiratory rate Extremities: All extremities are intact.  Skin: Skin color, texture,  turgor normal. No rashes seen  Psych: Appropriate mood and affect. Neurologic: Mental status: Alert, oriented to person, place, and time, thought content appropriate.   Results for orders placed or performed in visit on 10/06/22  POCT glycosylated hemoglobin (Hb A1C)  Result Value Ref Range   Hemoglobin A1C 6.1 (A) 4.0 -  5.6 %   Est. average glucose Bld gHb Est-mCnc 128     Assessment & Plan     1. Type 2 diabetes mellitus with morbid obesity ([facility]) Doing very well with mounjaro. A1c nearly to normal. Will increase to 7.5mg  with next refill. Counseled on potential adverse effects and she wishes to increase dose.   2. Morbid obesity ([facility]) Nearly 100 weight loss since staring semaglutide which has since been changed to mounjaro. Expect additional weight loss with increased dose of mounjaro  3. Primary hypertension Well controlled. Continue current medications.    Flu vaccine given today.   Follow up 4 months.          Mila Merry, MD  [facility] [phone] (phone) [phone] (fax)  [facility]

## 2022-10-06 ENCOUNTER — Encounter: Payer: Self-pay | Admitting: Family Medicine

## 2022-10-06 ENCOUNTER — Other Ambulatory Visit: Payer: Self-pay | Admitting: Family Medicine

## 2022-10-06 ENCOUNTER — Ambulatory Visit: Payer: 59 | Admitting: Family Medicine

## 2022-10-06 DIAGNOSIS — E1169 Type 2 diabetes mellitus with other specified complication: Secondary | ICD-10-CM

## 2022-10-06 DIAGNOSIS — Z23 Encounter for immunization: Secondary | ICD-10-CM | POA: Diagnosis not present

## 2022-10-06 DIAGNOSIS — R1011 Right upper quadrant pain: Secondary | ICD-10-CM

## 2022-10-06 DIAGNOSIS — I10 Essential (primary) hypertension: Secondary | ICD-10-CM | POA: Diagnosis not present

## 2022-10-06 LAB — POCT GLYCOSYLATED HEMOGLOBIN (HGB A1C)
Est. average glucose Bld gHb Est-mCnc: 128
Hemoglobin A1C: 6.1 % — AB (ref 4.0–5.6)

## 2022-10-06 NOTE — Patient Instructions (Signed)
.   Please review the attached list of medications and notify my office if there are any errors.   . Please bring all of your medications to every appointment so we can make sure that our medication list is the same as yours.   

## 2022-10-07 MED ORDER — OXYCODONE-ACETAMINOPHEN 7.5-325 MG PO TABS: 1.0000 | ORAL_TABLET | Freq: Four times a day (QID) | ORAL | 0 refills | Status: DC | PRN

## 2022-10-25 ENCOUNTER — Other Ambulatory Visit: Payer: Self-pay | Admitting: Family Medicine

## 2022-10-25 MED ORDER — TIRZEPATIDE 7.5 MG/0.5ML ~~LOC~~ SOAJ: 7.5000 mg | SUBCUTANEOUS | 1 refills | Status: DC

## 2022-10-27 ENCOUNTER — Other Ambulatory Visit: Payer: Self-pay | Admitting: Family Medicine

## 2022-10-27 NOTE — Telephone Encounter (Signed)
Requested medication (s) are due for refill today: yes  Requested medication (s) are on the active medication list: yes  Last refill:  10/25/22 #6 ml 1 refills  Future visit scheduled: yes in 3 months  Notes to clinic:   medication not assigned to a protocol. Patient requesting Rx sent to a different pharmacy. Please refill at [place] instead of [facility] delivery sent 10/25/22.     Requested Prescriptions  Pending Prescriptions Disp Refills   tirzepatide (MOUNJARO) 7.5 MG/0.5ML Pen 6 mL 1    Sig: Inject 7.5 mg into the skin once a week.     Off-Protocol Failed - 10/27/2022 12:29 PM      Failed - Medication not assigned to a protocol, review manually.      Passed - Valid encounter within last 12 months    Recent Outpatient Visits           3 weeks ago Type 2 diabetes mellitus with morbid obesity [facility])   [facility] Birdie Sons, MD   7 months ago Type 2 diabetes mellitus with morbid obesity [facility])   [facility] Birdie Sons, MD   11 months ago Type 2 diabetes mellitus with morbid obesity [facility])   [facility] Birdie Sons, MD   1 year ago Chronic, continuous use of opioids   [facility] Birdie Sons, MD   1 year ago Morbid obesity [facility])   [facility] Birdie Sons, MD       Future Appointments             In 3 months Fisher, Kirstie Peri, MD [facility] [place]

## 2022-10-27 NOTE — Telephone Encounter (Unsigned)
Copied from [place] [id number]. Topic: General - Other >> Oct 27, 2022 12:15 PM Everette C wrote: Reason for CRM: Medication Refill - Medication: tirzepatide Darcel Bayley) 7.5 MG/0.5ML Pen [[id number]]  Has the patient contacted their pharmacy? Yes.   (Agent: If no, request that the patient contact the pharmacy for the refill. If patient does not wish to contact the pharmacy document the reason why and proceed with request.) (Agent: If yes, when and what did the pharmacy advise?)  Preferred Pharmacy (with phone number or street name): [id number] [address], [address] [place] [address] [place] [address] Phone: [phone] Fax: [phone] Hours: Not open 24 hours   Has the patient been seen for an appointment in the last year OR does the patient have an upcoming appointment? Yes.    Agent: Please be advised that RX refills may take up to 3 business days. We ask that you follow-up with your pharmacy.

## 2022-11-02 ENCOUNTER — Encounter: Payer: Self-pay | Admitting: Family Medicine

## 2022-11-02 ENCOUNTER — Telehealth (INDEPENDENT_AMBULATORY_CARE_PROVIDER_SITE_OTHER): Payer: 59 | Admitting: Family Medicine

## 2022-11-02 DIAGNOSIS — U071 COVID-19: Secondary | ICD-10-CM | POA: Insufficient documentation

## 2022-11-02 MED ORDER — NIRMATRELVIR/RITONAVIR (PAXLOVID)TABLET: 3.0000 | ORAL_TABLET | Freq: Two times a day (BID) | ORAL | 0 refills | Status: AC

## 2022-11-02 NOTE — Progress Notes (Signed)
I,Connie R Striblin,acting as a Education administrator for Gwyneth Sprout, FNP.,have documented all relevant documentation on the behalf of Gwyneth Sprout, FNP,as directed by  Gwyneth Sprout, FNP while in the presence of Gwyneth Sprout, FNP.   MyChart Video Visit  Virtual Visit via Video Note   This format is felt to be most appropriate for this patient at this time. Physical exam was limited by quality of the video and audio technology used for the visit.   Patient location: home Provider location: [facility]  [address]  [address] [place], [place] [address]   I discussed the limitations of evaluation and management by telemedicine and the availability of in person appointments. The patient expressed understanding and agreed to proceed.  Patient: Jessica Fields   DOB: Oct 17, 1981   42 y.o. Female  MRN: [id number] Visit Date: 11/02/2022  Today's healthcare provider: Gwyneth Sprout, FNP  Introduced to nurse practitioner role and practice setting.  All questions answered.  Discussed provider/patient relationship and expectations.  Subjective    HPI  Covid Positive: Patient presents today with Covid- 19. Patient tested positive 1 days ago.  Symptoms began 3 day ago. Cough described as non-productive. Patient denies chills and fever. Associated symptoms include nasal congestion and and fatigue .  Pt states that all symptoms are very mild. However, concern noted given co-morbid conditions.  Medications: Outpatient Medications Prior to Visit  Medication Sig   amLODipine (NORVASC) 10 MG tablet Take 1 tablet (10 mg total) by mouth daily.   celecoxib (CELEBREX) 200 MG capsule TAKE 1 CAPSULE BY MOUTH TWICE DAILY   diclofenac sodium (VOLTAREN) 1 % GEL Apply 4 g topically 4 (four) times daily as needed.   fluticasone (FLONASE) 50 MCG/ACT nasal spray Place 2 sprays into both nostrils daily as needed.   lisinopril-hydrochlorothiazide (ZESTORETIC) 10-12.5 MG tablet TAKE 1 TABLET BY MOUTH EVERY  DAY   metFORMIN (GLUCOPHAGE) 500 MG tablet Take 1 tablet (500 mg total) by mouth 3 (three) times daily with meals.   omeprazole (PRILOSEC) 20 MG capsule Take 1 capsule (20 mg total) by mouth daily.   oxyCODONE-acetaminophen (PERCOCET) 7.5-325 MG tablet Take 1 tablet by mouth every 6 (six) hours as needed for severe pain.   promethazine (PHENERGAN) 25 MG tablet Take 1 tablet (25 mg total) by mouth every 8 (eight) hours as needed for nausea or vomiting.   sertraline (ZOLOFT) 50 MG tablet Take 1 tablet (50 mg total) by mouth daily.   tirzepatide (MOUNJARO) 7.5 MG/0.5ML Pen Inject 7.5 mg into the skin once a week.   No facility-administered medications prior to visit.    Review of Systems   Objective    There were no vitals taken for this visit.  Physical Exam Constitutional:      General: She is not in acute distress.    Appearance: Normal appearance. She is obese. She is not ill-appearing or toxic-appearing.  Pulmonary:     Effort: Pulmonary effort is normal.  Skin:    General: Skin is dry.  Neurological:     General: No focal deficit present.     Mental Status: She is alert and oriented to person, place, and time.      Assessment & Plan     Problem List Items Addressed This Visit       Other   COVID-19 - Primary    Acute, stable 3 days of s/s 1 day since testing Reviewed CDC 5 day quarantine 5 day masking Wishes  to use antiviral Labs verified Educated on use of opioids with antiviral RTC as needed       Relevant Medications   nirmatrelvir/ritonavir (PAXLOVID) 20 x 150 MG & 10 x 100MG  TABS   Return if symptoms worsen or fail to improve.    I discussed the assessment and treatment plan with the patient. The patient was provided an opportunity to ask questions and all were answered. The patient agreed with the plan and demonstrated an understanding of the instructions.   The patient was advised to call back or seek an in-person evaluation if the symptoms worsen or  if the condition fails to improve as anticipated.  I provided 10 minutes of face-to-face time during this encounter reviewing previous medical history, medications, labs and plan for COVID treatment. Pt agreeable to use antiviral to assist with recovery. Encouraged to schedule office visit if not improved. CDC guidelines reviewed with patient.  Vonna Kotyk, FNP, have reviewed all documentation for this visit. The documentation on 11/02/22 for the exam, diagnosis, procedures, and orders are all accurate and complete.  Gwyneth Sprout, [place] [phone] (phone) [phone] (fax)  [place]

## 2022-11-02 NOTE — Patient Instructions (Signed)
Your symptoms and exam findings are most consistent with a viral upper respiratory infection. These usually run their course in 5-7 days. Unfortunately, antibiotics don't work against viruses and just increase your risk of other issues such as diarrhea, yeast infections, and resistant infections.  If you start feeling worse with facial pain, high fever, cough, shortness of breath or start feeling significantly worse, please call us right away to be further evaluated.  Some things that can make you feel better are: - Increased rest - Increasing Fluids - Acetaminophen / ibuprofen as needed for fever/pain.  - Salt water gargling, chloraseptic spray and throat lozenges - OTC pseudoephedrine.  - Mucinex.  - Saline sinus flushes or a neti pot.  - Humidifying the air.  

## 2022-11-02 NOTE — Assessment & Plan Note (Signed)
Acute, stable 3 days of s/s 1 day since testing Reviewed CDC 5 day quarantine 5 day masking Wishes to use antiviral Labs verified Educated on use of opioids with antiviral RTC as needed

## 2022-11-03 ENCOUNTER — Other Ambulatory Visit: Payer: Self-pay | Admitting: Family Medicine

## 2022-11-03 DIAGNOSIS — R1011 Right upper quadrant pain: Secondary | ICD-10-CM

## 2022-11-03 MED ORDER — OXYCODONE-ACETAMINOPHEN 7.5-325 MG PO TABS: 1.0000 | ORAL_TABLET | Freq: Four times a day (QID) | ORAL | 0 refills | Status: DC | PRN

## 2022-11-08 ENCOUNTER — Other Ambulatory Visit: Payer: Self-pay | Admitting: Family Medicine

## 2022-11-08 DIAGNOSIS — R490 Dysphonia: Secondary | ICD-10-CM

## 2022-11-08 DIAGNOSIS — E1169 Type 2 diabetes mellitus with other specified complication: Secondary | ICD-10-CM

## 2022-11-08 MED ORDER — TIRZEPATIDE 7.5 MG/0.5ML ~~LOC~~ SOAJ: 7.5000 mg | SUBCUTANEOUS | 1 refills | Status: DC

## 2022-11-08 NOTE — Telephone Encounter (Signed)
Medication Refill - Medication: tirzepatide [facility]) 7.5 MG/0.5ML Pen [[id number]]  Pt is calling to request if her script can be sent to the below pharmacy. It was sent to the wrong pharmacy.   Has the patient contacted their pharmacy? No. (Agent: If no, request that the patient contact the pharmacy for the refill. If patient does not wish to contact the pharmacy document the reason why and proceed with request.) (Agent: If yes, when and what did the pharmacy advise?)  Preferred Pharmacy (with phone number or street name):  [facility] #[id number] [address], [place] Phone: [phone]  Fax: [phone]     Has the patient been seen for an appointment in the last year OR does the patient have an upcoming appointment? Yes.    Agent: Please be advised that RX refills may take up to 3 business days. We ask that you follow-up with your pharmacy.

## 2022-11-08 NOTE — Telephone Encounter (Signed)
Requested Prescriptions  Pending Prescriptions Disp Refills   tirzepatide (MOUNJARO) 7.5 MG/0.5ML Pen 6 mL 1    Sig: Inject 7.5 mg into the skin once a week.     Off-Protocol Failed - 11/08/2022 12:38 PM      Failed - Medication not assigned to a protocol, review manually.      Passed - Valid encounter within last 12 months    Recent Outpatient Visits           6 days ago [place] Tally Joe T, FNP   1 month ago Type 2 diabetes mellitus with morbid obesity [facility])   [place], Donald E, MD   7 months ago Type 2 diabetes mellitus with morbid obesity [facility])   [place] Birdie Sons, MD   11 months ago Type 2 diabetes mellitus with morbid obesity [facility])   [place], Donald E, MD   1 year ago Chronic, continuous use of opioids   [place], Donald E, MD       Future Appointments             In 3 months Fisher, Kirstie Peri, MD [facility], [place]

## 2022-11-26 ENCOUNTER — Other Ambulatory Visit: Payer: Self-pay | Admitting: Family Medicine

## 2022-11-26 DIAGNOSIS — M7661 Achilles tendinitis, right leg: Secondary | ICD-10-CM

## 2022-11-26 NOTE — Telephone Encounter (Signed)
Requested Prescriptions  Pending Prescriptions Disp Refills   celecoxib (CELEBREX) 200 MG capsule [Pharmacy Med Name: CELECOXIB 200MG CAPSULES] 180 capsule 1    Sig: TAKE 1 CAPSULE BY MOUTH TWICE DAILY     Analgesics:  COX2 Inhibitors Failed - 11/26/2022  5:43 PM      Failed - Manual Review: Labs are only required if the patient has taken medication for more than 8 weeks.      Failed - AST in normal range and within 360 days    AST  Date Value Ref Range Status  03/16/2022 48 (H) 0 - 40 IU/L Final   SGOT(AST)  Date Value Ref Range Status  02/08/2015 25 U/L Final    Comment:    15-41 NOTE: New Reference Range  12/24/14          Failed - ALT in normal range and within 360 days    ALT  Date Value Ref Range Status  03/16/2022 34 (H) 0 - 32 IU/L Final   SGPT (ALT)  Date Value Ref Range Status  02/08/2015 27 U/L Final    Comment:    14-54 NOTE: New Reference Range  12/24/14          Passed - HGB in normal range and within 360 days    Hemoglobin  Date Value Ref Range Status  03/16/2022 11.5 11.1 - 15.9 g/dL Final         Passed - Cr in normal range and within 360 days    Creat  Date Value Ref Range Status  07/06/2017 0.96 0.50 - 1.10 mg/dL Final   Creatinine, Ser  Date Value Ref Range Status  03/16/2022 0.92 0.57 - 1.00 mg/dL Final         Passed - HCT in normal range and within 360 days    Hematocrit  Date Value Ref Range Status  03/16/2022 35.9 34.0 - 46.6 % Final         Passed - eGFR is 30 or above and within 360 days    GFR, Est African American  Date Value Ref Range Status  07/06/2017 87 > OR = 60 mL/min/1.18m Final   GFR calc Af Amer  Date Value Ref Range Status  09/25/2019 96 >59 mL/min/1.73 Final   GFR, Est Non African American  Date Value Ref Range Status  07/06/2017 75 > OR = 60 mL/min/1.759mFinal   GFR calc non Af Amer  Date Value Ref Range Status  09/25/2019 84 >59 mL/min/1.73 Final   eGFR  Date Value Ref Range Status  03/16/2022 80  >59 mL/min/1.73 Final         Passed - Patient is not pregnant      Passed - Valid encounter within last 12 months    Recent Outpatient Visits           3 weeks ago COBookerElise T, FNP   1 month ago Type 2 diabetes mellitus with morbid obesity ([place]  Co[place]orseiBirdie SonsMD   8 months ago [facility]bid [facility]  [place] [facility]   1 year ago Type 2 diabetes mellitus with morbid [facility]  Co[place] HilliBirdie SonsMD   1 year ago Chronic, continuous use of opioids   [facility]  Future Appointments             In 2 months Fisher, Kirstie Peri, MD [facility], [facility]

## 2022-12-02 ENCOUNTER — Other Ambulatory Visit: Payer: Self-pay | Admitting: Family Medicine

## 2022-12-02 DIAGNOSIS — R1011 Right upper quadrant pain: Secondary | ICD-10-CM

## 2022-12-02 MED ORDER — OXYCODONE-ACETAMINOPHEN 7.5-325 MG PO TABS: 1.0000 | ORAL_TABLET | Freq: Four times a day (QID) | ORAL | 0 refills | Status: DC | PRN

## 2022-12-18 ENCOUNTER — Other Ambulatory Visit: Payer: Self-pay | Admitting: Family Medicine

## 2022-12-18 DIAGNOSIS — I10 Essential (primary) hypertension: Secondary | ICD-10-CM

## 2022-12-20 NOTE — Telephone Encounter (Signed)
Requested medication (s) are due for refill today: yes  Requested medication (s) are on the active medication list: yes  Last refill:  09/02/22 #90/0  Future visit scheduled: yes  Notes to clinic:  Unable to refill per protocol due to failed labs, no updated results.    Requested Prescriptions  Pending Prescriptions Disp Refills   lisinopril-hydrochlorothiazide (ZESTORETIC) 10-12.5 MG tablet [Pharmacy Med Name: LISINOPRIL-HCTZ 10/12.'5MG'$  TABLETS] 90 tablet 0    Sig: TAKE 1 TABLET BY MOUTH EVERY DAY     Cardiovascular:  ACEI + Diuretic Combos Failed - 12/18/2022 12:03 AM      Failed - Na in normal range and within 180 days    Sodium  Date Value Ref Range Status  03/16/2022 138 134 - 144 mmol/L Final  02/08/2015 138 mmol/L Final    Comment:    135-145 NOTE: New Reference Range  12/24/14          Failed - K in normal range and within 180 days    Potassium  Date Value Ref Range Status  03/16/2022 4.5 3.5 - 5.2 mmol/L Final  02/08/2015 3.6 mmol/L Final    Comment:    3.5-5.1 NOTE: New Reference Range  12/24/14          Failed - Cr in normal range and within 180 days    Creat  Date Value Ref Range Status  07/06/2017 0.96 0.50 - 1.10 mg/dL Final   Creatinine, Ser  Date Value Ref Range Status  03/16/2022 0.92 0.57 - 1.00 mg/dL Final         Failed - eGFR is 30 or above and within 180 days    GFR, Est African American  Date Value Ref Range Status  07/06/2017 87 > OR = 60 mL/min/1.59m Final   GFR calc Af Amer  Date Value Ref Range Status  09/25/2019 96 >59 mL/min/1.73 Final   GFR, Est Non African American  Date Value Ref Range Status  07/06/2017 75 > OR = 60 mL/min/1.717mFinal   GFR calc non Af Amer  Date Value Ref Range Status  09/25/2019 84 >59 mL/min/1.73 Final   eGFR  Date Value Ref Range Status  03/16/2022 80 >59 mL/min/1.73 Final         Passed - Patient is not pregnant      Passed - Last BP in normal range    BP Readings from Last 1 Encounters:   10/06/22 125/81         Passed - Valid encounter within last 6 months    Recent Outpatient Visits           1 month ago COBulverdeaTally Joe, FNP   2 months ago Type 2 diabetes mellitus with morbid obesity (HC[facility]  CoEast PecosiBirdie SonsMD   9 months ago Type 2 diabetes mellitus with morbid [facility]  CoWilsallDonald E, MD   1 year ago Type 2 diabetes mellitus with morbid obesity ([facility]  CoMayoDonald E, MD   1 year ago Chronic, continuous use of opioids   CoWashakieDonald E, MD       Future Appointments             In 1 month Fisher, DoKirstie PeriMD [facility] Institute[facility]

## 2022-12-28 ENCOUNTER — Other Ambulatory Visit: Payer: Self-pay | Admitting: Family Medicine

## 2022-12-28 DIAGNOSIS — R1011 Right upper quadrant pain: Secondary | ICD-10-CM

## 2022-12-30 MED ORDER — OXYCODONE-ACETAMINOPHEN 7.5-325 MG PO TABS: 1.0000 | ORAL_TABLET | Freq: Four times a day (QID) | ORAL | 0 refills | Status: DC | PRN

## 2022-12-31 ENCOUNTER — Other Ambulatory Visit: Payer: Self-pay | Admitting: Family Medicine

## 2022-12-31 DIAGNOSIS — F439 Reaction to severe stress, unspecified: Secondary | ICD-10-CM

## 2022-12-31 DIAGNOSIS — I10 Essential (primary) hypertension: Secondary | ICD-10-CM

## 2023-01-26 ENCOUNTER — Other Ambulatory Visit: Payer: Self-pay | Admitting: Family Medicine

## 2023-01-26 DIAGNOSIS — R1011 Right upper quadrant pain: Secondary | ICD-10-CM

## 2023-01-27 MED ORDER — OXYCODONE-ACETAMINOPHEN 7.5-325 MG PO TABS: 1.0000 | ORAL_TABLET | Freq: Four times a day (QID) | ORAL | 0 refills | Status: DC | PRN

## 2023-02-07 ENCOUNTER — Ambulatory Visit (INDEPENDENT_AMBULATORY_CARE_PROVIDER_SITE_OTHER): Payer: 59 | Admitting: Family Medicine

## 2023-02-07 ENCOUNTER — Encounter: Payer: Self-pay | Admitting: Family Medicine

## 2023-02-07 DIAGNOSIS — I10 Essential (primary) hypertension: Secondary | ICD-10-CM | POA: Diagnosis not present

## 2023-02-07 DIAGNOSIS — L03019 Cellulitis of unspecified finger: Secondary | ICD-10-CM

## 2023-02-07 DIAGNOSIS — E1169 Type 2 diabetes mellitus with other specified complication: Secondary | ICD-10-CM | POA: Diagnosis not present

## 2023-02-07 LAB — POCT GLYCOSYLATED HEMOGLOBIN (HGB A1C)
Est. average glucose Bld gHb Est-mCnc: 128
Hemoglobin A1C: 6.1 % — AB (ref 4.0–5.6)

## 2023-02-07 MED ORDER — CEPHALEXIN 500 MG PO CAPS: 500.0000 mg | ORAL_CAPSULE | Freq: Four times a day (QID) | ORAL | 0 refills | Status: AC

## 2023-02-07 MED ORDER — TIRZEPATIDE 10 MG/0.5ML ~~LOC~~ SOAJ: 10.0000 mg | SUBCUTANEOUS | 3 refills | Status: DC

## 2023-02-07 NOTE — Progress Notes (Signed)
Established patient visit   Patient: Jessica Fields   DOB: Mar 14, 1981   42 y.o. Female  MRN: [id number] Visit Date: 02/07/2023  Today's healthcare provider: Mila Merry, MD    Subjective    HPI  Diabetes Mellitus Type II, Follow-up  Lab Results  Component Value Date   HGBA1C 6.1 (A) 10/06/2022   HGBA1C 9.0 (H) 03/16/2022   HGBA1C 6.5 (A) 10/05/2021   Wt Readings from Last 3 Encounters:  10/06/22 (!) 382 lb (173.3 kg)  06/16/22 (!) 390 lb (176.9 kg)  03/16/22 (!) 427 lb (193.7 kg)   Last seen for diabetes 4 months ago.  Management since then includes increase to 7.5mg  with next refill . She reports excellent compliance with treatment. She is having side effects. Mild constipation for a few days after injection.  Symptoms: No fatigue No foot ulcerations  Yes appetite changes No nausea  No paresthesia of the feet  No polydipsia  No polyuria No visual disturbances   No vomiting     Episodes of hypoglycemia? No    Pertinent Labs: Lab Results  Component Value Date   CHOL 175 03/16/2022   HDL 35 (L) 03/16/2022   LDLCALC 96 03/16/2022   TRIG 259 (H) 03/16/2022   CHOLHDL 5.0 (H) 03/16/2022   Lab Results  Component Value Date   NA 138 03/16/2022   K 4.5 03/16/2022   CREATININE 0.92 03/16/2022   EGFR 80 03/16/2022   LABMICR 107.8 10/05/2021   MICRALBCREAT 124 (H) 10/05/2021    ---------------------------------------------------------------------------------------------------  Hypertension, follow-up  BP Readings from Last 3 Encounters:  10/06/22 125/81  06/16/22 (!) 179/82  03/16/22 138/75   Wt Readings from Last 3 Encounters:  10/06/22 (!) 382 lb (173.3 kg)  06/16/22 (!) 390 lb (176.9 kg)  03/16/22 (!) 427 lb (193.7 kg)     She was last seen for hypertension 4 months ago.  BP at that visit was 125/81. Management since that visit includes continue current treatment.   Pertinent labs Lab Results  Component Value Date   CHOL 175 03/16/2022   HDL  35 (L) 03/16/2022   LDLCALC 96 03/16/2022   TRIG 259 (H) 03/16/2022   CHOLHDL 5.0 (H) 03/16/2022   Lab Results  Component Value Date   NA 138 03/16/2022   K 4.5 03/16/2022   CREATININE 0.92 03/16/2022   EGFR 80 03/16/2022   GLUCOSE 148 (H) 03/16/2022   TSH 0.711 03/16/2022     The 10-year ASCVD risk score (Arnett DK, et al., 2019) is: 3% ---------------------------------------------------------------------------------------------------   Medications: Outpatient Medications Prior to Visit  Medication Sig   amLODipine (NORVASC) 10 MG tablet TAKE 1 TABLET(10 MG) BY MOUTH DAILY   celecoxib (CELEBREX) 200 MG capsule TAKE 1 CAPSULE BY MOUTH TWICE DAILY   diclofenac sodium (VOLTAREN) 1 % GEL Apply 4 g topically 4 (four) times daily as needed.   fluticasone (FLONASE) 50 MCG/ACT nasal spray Place 2 sprays into both nostrils daily as needed.   lisinopril-hydrochlorothiazide (ZESTORETIC) 10-12.5 MG tablet TAKE 1 TABLET BY MOUTH EVERY DAY   metFORMIN (GLUCOPHAGE) 500 MG tablet Take 1 tablet (500 mg total) by mouth 3 (three) times daily with meals.   omeprazole (PRILOSEC) 20 MG capsule TAKE 1 CAPSULE(20 MG) BY MOUTH DAILY   oxyCODONE-acetaminophen (PERCOCET) 7.5-325 MG tablet Take 1 tablet by mouth every 6 (six) hours as needed for severe pain.   promethazine (PHENERGAN) 25 MG tablet Take 1 tablet (25 mg total) by mouth every 8 (eight) hours as  needed for nausea or vomiting.   sertraline (ZOLOFT) 50 MG tablet TAKE 1 TABLET(50 MG) BY MOUTH DAILY   tirzepatide (MOUNJARO) 7.5 MG/0.5ML Pen Inject 7.5 mg into the skin once a week.   No facility-administered medications prior to visit.    Review of Systems  Respiratory: Negative.  Negative for cough, shortness of breath and wheezing.   Cardiovascular:  Negative for chest pain, palpitations and leg swelling.  Neurological:  Negative for weakness and headaches.       Objective    BP (!) 148/67   Pulse 87   Temp 98.8 F (37.1 C)   Ht 5\' 6"   (1.676 m)   Wt (!) 370 lb (167.8 kg)   SpO2 96%   BMI 59.72 kg/m    Physical Exam  General appearance: Severely obese female, cooperative and in no acute distress Head: Normocephalic, without obvious abnormality, atraumatic Respiratory: Respirations even and unlabored, normal respiratory rate Extremities: All extremities are intact.  Skin: Inflamed swollen paronychia of all fingers since new nails applied.  Psych: Appropriate mood and affect. Neurologic: Mental status: Alert, oriented to person, place, and time, thought content appropriate.   Results for orders placed or performed in visit on 02/07/23  POCT HgB A1C  Result Value Ref Range   Hemoglobin A1C 6.1 (A) 4.0 - 5.6 %   Est. average glucose Bld gHb Est-mCnc 128     Assessment & Plan     1. Type 2 diabetes mellitus with morbid obesity Doing well with Mounjaro. Mild constipation for a few days after shot. Recommend take OTC metamucil routinely when she gets injection.  - Urine microalbumin-creatinine with uACR  Increase tirzepatide (MOUNJARO) to 10 MG/0.5ML Pen; Inject 10 mg into the skin once a week.  Dispense: 2 mL; Refill: 3  2. Primary hypertension Stable. Expect improvement with continued weight loss.   3. Morbid obesity Losing weight slowly but steadily.   4. Paronychia of finger, unspecified laterality Consider this is on all fingers I suspect was triggered by artificial nail application.  - cephALEXin (KEFLEX) 500 MG capsule; Take 1 capsule (500 mg total) by mouth 4 (four) times daily for 7 days.  Dispense: 28 capsule; Refill: 0   Call if symptoms change or if not rapidly improving.        The entirety of the information documented in the History of Present Illness, Review of Systems and Physical Exam were personally obtained by me. Portions of this information were initially documented by the CMA and reviewed by me for thoroughness and accuracy.     Mila Merry, MD  [facility] [phone] (phone) [phone] (fax)  [facility]

## 2023-02-08 LAB — MICROALBUMIN / CREATININE URINE RATIO
Creatinine, Urine: 166.7 mg/dL
Microalb/Creat Ratio: 5 mg/g creat (ref 0–29)
Microalbumin, Urine: 8.2 ug/mL

## 2023-02-17 ENCOUNTER — Other Ambulatory Visit: Payer: Self-pay | Admitting: Family Medicine

## 2023-02-17 DIAGNOSIS — E119 Type 2 diabetes mellitus without complications: Secondary | ICD-10-CM

## 2023-02-23 ENCOUNTER — Other Ambulatory Visit: Payer: Self-pay | Admitting: Family Medicine

## 2023-02-23 DIAGNOSIS — R1011 Right upper quadrant pain: Secondary | ICD-10-CM

## 2023-02-24 MED ORDER — OXYCODONE-ACETAMINOPHEN 7.5-325 MG PO TABS
1.0000 | ORAL_TABLET | Freq: Four times a day (QID) | ORAL | 0 refills | Status: DC | PRN
Start: 2023-02-24 — End: 2023-03-29

## 2023-03-15 ENCOUNTER — Other Ambulatory Visit: Payer: Self-pay | Admitting: Family Medicine

## 2023-03-15 DIAGNOSIS — R490 Dysphonia: Secondary | ICD-10-CM

## 2023-03-29 ENCOUNTER — Other Ambulatory Visit: Payer: Self-pay | Admitting: Family Medicine

## 2023-03-29 DIAGNOSIS — R1011 Right upper quadrant pain: Secondary | ICD-10-CM

## 2023-03-30 MED ORDER — OXYCODONE-ACETAMINOPHEN 7.5-325 MG PO TABS
1.0000 | ORAL_TABLET | Freq: Four times a day (QID) | ORAL | 0 refills | Status: DC | PRN
Start: 2023-03-30 — End: 2023-04-28

## 2023-04-11 ENCOUNTER — Other Ambulatory Visit: Payer: Self-pay | Admitting: Family Medicine

## 2023-04-11 DIAGNOSIS — I10 Essential (primary) hypertension: Secondary | ICD-10-CM

## 2023-04-12 ENCOUNTER — Other Ambulatory Visit: Payer: Self-pay | Admitting: Family Medicine

## 2023-04-12 DIAGNOSIS — I10 Essential (primary) hypertension: Secondary | ICD-10-CM

## 2023-04-12 NOTE — Telephone Encounter (Signed)
Requested medications are due for refill today.  yes  Requested medications are on the active medications list.  yes  Last refill. 12/20/2022 #90 0 rf  Future visit scheduled.   yes  Notes to clinic.  Labs are expired.    Requested Prescriptions  Pending Prescriptions Disp Refills   lisinopril-hydrochlorothiazide (ZESTORETIC) 10-12.5 MG tablet [Pharmacy Med Name: LISINOPRIL-HCTZ 10/12.5MG  TABLETS] 90 tablet 0    Sig: TAKE 1 TABLET BY MOUTH EVERY DAY     Cardiovascular:  ACEI + Diuretic Combos Failed - 04/11/2023  3:13 PM      Failed - Na in normal range and within 180 days    Sodium  Date Value Ref Range Status  03/16/2022 138 134 - 144 mmol/L Final  02/08/2015 138 mmol/L Final    Comment:    135-145 NOTE: New Reference Range  12/24/14          Failed - K in normal range and within 180 days    Potassium  Date Value Ref Range Status  03/16/2022 4.5 3.5 - 5.2 mmol/L Final  02/08/2015 3.6 mmol/L Final    Comment:    3.5-5.1 NOTE: New Reference Range  12/24/14          Failed - Cr in normal range and within 180 days    Creat  Date Value Ref Range Status  07/06/2017 0.96 0.50 - 1.10 mg/dL Final   Creatinine, Ser  Date Value Ref Range Status  03/16/2022 0.92 0.57 - 1.00 mg/dL Final         Failed - eGFR is 30 or above and within 180 days    GFR, Est African American  Date Value Ref Range Status  07/06/2017 87 > OR = 60 mL/min/1.69m2 Final   GFR calc Af Amer  Date Value Ref Range Status  09/25/2019 96 >59 mL/min/1.73 Final   GFR, Est Non African American  Date Value Ref Range Status  07/06/2017 75 > OR = 60 mL/min/1.91m2 Final   GFR calc non Af Amer  Date Value Ref Range Status  09/25/2019 84 >59 mL/min/1.73 Final   eGFR  Date Value Ref Range Status  03/16/2022 80 >59 mL/min/1.73 Final         Failed - Last BP in normal range    BP Readings from Last 1 Encounters:  02/07/23 (!) 148/67         Passed - Patient is not pregnant      Passed - Valid  encounter within last 6 months    Recent Outpatient Visits           2 months ago Type 2 diabetes mellitus with morbid obesity (HCC)   [place] [facility] Malva Limes, MD   5 months ago COVID-19   [facility] Merita Norton T, FNP   6 months ago Type 2 diabetes mellitus with morbid [facility])   [place] [facility] Malva Limes, MD   1 year ago Type 2 diabetes mellitus with morbid obesity [facility])   [place] [facility] Malva Limes, MD   1 year ago Type 2 diabetes mellitus with morbid obesity [facility])    [place] [facility] Malva Limes, MD       Future Appointments             In 1 month Fisher, Demetrios Isaacs, MD [facility], [facility]

## 2023-04-13 MED ORDER — LISINOPRIL-HYDROCHLOROTHIAZIDE 10-12.5 MG PO TABS
1.0000 | ORAL_TABLET | Freq: Every day | ORAL | 0 refills | Status: DC
Start: 2023-04-13 — End: 2023-07-25

## 2023-04-28 ENCOUNTER — Other Ambulatory Visit: Payer: Self-pay | Admitting: Family Medicine

## 2023-04-28 DIAGNOSIS — R1011 Right upper quadrant pain: Secondary | ICD-10-CM

## 2023-04-30 MED ORDER — OXYCODONE-ACETAMINOPHEN 7.5-325 MG PO TABS
1.0000 | ORAL_TABLET | Freq: Four times a day (QID) | ORAL | 0 refills | Status: DC | PRN
Start: 2023-04-30 — End: 2023-05-28

## 2023-05-28 ENCOUNTER — Other Ambulatory Visit: Payer: Self-pay | Admitting: Family Medicine

## 2023-05-28 DIAGNOSIS — R1011 Right upper quadrant pain: Secondary | ICD-10-CM

## 2023-05-29 MED ORDER — OXYCODONE-ACETAMINOPHEN 7.5-325 MG PO TABS
1.0000 | ORAL_TABLET | Freq: Four times a day (QID) | ORAL | 0 refills | Status: DC | PRN
Start: 2023-05-29 — End: 2023-06-28

## 2023-06-04 ENCOUNTER — Other Ambulatory Visit: Payer: Self-pay | Admitting: Family Medicine

## 2023-06-04 DIAGNOSIS — R490 Dysphonia: Secondary | ICD-10-CM

## 2023-06-06 ENCOUNTER — Ambulatory Visit: Payer: 59 | Admitting: Family Medicine

## 2023-06-24 ENCOUNTER — Encounter: Payer: Self-pay | Admitting: Family Medicine

## 2023-06-24 ENCOUNTER — Ambulatory Visit (INDEPENDENT_AMBULATORY_CARE_PROVIDER_SITE_OTHER): Payer: 59 | Admitting: Family Medicine

## 2023-06-24 VITALS — BP 124/63 | HR 92 | Temp 97.8°F | Resp 12 | Ht 66.0 in | Wt 364.0 lb

## 2023-06-24 DIAGNOSIS — K811 Chronic cholecystitis: Secondary | ICD-10-CM

## 2023-06-24 DIAGNOSIS — F1190 Opioid use, unspecified, uncomplicated: Secondary | ICD-10-CM

## 2023-06-24 DIAGNOSIS — Z7984 Long term (current) use of oral hypoglycemic drugs: Secondary | ICD-10-CM

## 2023-06-24 DIAGNOSIS — Z23 Encounter for immunization: Secondary | ICD-10-CM

## 2023-06-24 DIAGNOSIS — E1169 Type 2 diabetes mellitus with other specified complication: Secondary | ICD-10-CM

## 2023-06-24 LAB — POCT GLYCOSYLATED HEMOGLOBIN (HGB A1C): Hemoglobin A1C: 6.3 % — AB (ref 4.0–5.6)

## 2023-06-24 MED ORDER — TIRZEPATIDE 10 MG/0.5ML ~~LOC~~ SOAJ: 10.0000 mg | SUBCUTANEOUS | 3 refills | Status: DC

## 2023-06-24 NOTE — Progress Notes (Signed)
Established patient visit   Patient: Jessica Fields   DOB: 10-Jan-1981   42 y.o. Female  MRN: [id number] Visit Date: 06/24/2023  Today's healthcare provider: Mila Merry, MD   Chief Complaint  Patient presents with   Diabetes   Subjective    Discussed the use of AI scribe software for clinical note transcription with the patient, who gave verbal consent to proceed.  History of Present Illness   The patient, with a history of diabetes and chronic gallstones, presents for a follow-up visit to discuss her diabetes medication, Mounjaro. She reports that she was unable to find the 10 mg dose of Mounjaro and had to revert to the 7.5 mg dose. She has one dose left of the 7.5 mg and is due for her next injection soon. The patient's recent A1c was 6.3, slightly elevated due to the medication issue.  In addition to diabetes, the patient also reports chronic pain in abdomen and back related to gallstones, and  controlled with oxycodone. The pain is generalized, affecting the stomach, back, and knees. She takes the medication every four hours during the day but not at night.  The patient also reports a history of gallbladder attacks, with the most recent episode occurring a few weeks ago. She expresses a desire to have her gallbladder removed once she has lost some weight. The patient's weight has been a concern for previous medical procedures.       Medications: Outpatient Medications Prior to Visit  Medication Sig   amLODipine (NORVASC) 10 MG tablet TAKE 1 TABLET(10 MG) BY MOUTH DAILY   celecoxib (CELEBREX) 200 MG capsule TAKE 1 CAPSULE BY MOUTH TWICE DAILY   diclofenac sodium (VOLTAREN) 1 % GEL Apply 4 g topically 4 (four) times daily as needed.   fluticasone (FLONASE) 50 MCG/ACT nasal spray Place 2 sprays into both nostrils daily as needed.   lisinopril-hydrochlorothiazide (ZESTORETIC) 10-12.5 MG tablet Take 1 tablet by mouth daily.   metFORMIN (GLUCOPHAGE) 500 MG tablet TAKE 1  TABLET(500 MG) BY MOUTH THREE TIMES DAILY WITH MEALS   omeprazole (PRILOSEC) 20 MG capsule TAKE 1 CAPSULE(20 MG) BY MOUTH DAILY   oxyCODONE-acetaminophen (PERCOCET) 7.5-325 MG tablet Take 1 tablet by mouth every 6 (six) hours as needed for severe pain.   promethazine (PHENERGAN) 25 MG tablet Take 1 tablet (25 mg total) by mouth every 8 (eight) hours as needed for nausea or vomiting.   sertraline (ZOLOFT) 50 MG tablet TAKE 1 TABLET(50 MG) BY MOUTH DAILY   [DISCONTINUED] tirzepatide (MOUNJARO) 10 MG/0.5ML Pen Inject 10 mg into the skin once a week.   No facility-administered medications prior to visit.       Objective    BP 124/63 (BP Location: Left Arm, Patient Position: Sitting, Cuff Size: Large)   Pulse 92   Temp 97.8 F (36.6 C) (Temporal)   Resp 12   Ht 5\' 6"  (1.676 m)   Wt (!) 364 lb (165.1 kg)   SpO2 97%   BMI 58.75 kg/m  Wt Readings from Last 3 Encounters:  06/24/23 (!) 364 lb (165.1 kg)  02/07/23 (!) 370 lb (167.8 kg)  10/06/22 (!) 382 lb (173.3 kg)      Physical Exam   General appearance: Severely obese female, cooperative and in no acute distress Head: Normocephalic, without obvious abnormality, atraumatic Respiratory: Respirations even and unlabored, normal respiratory rate Extremities: All extremities are intact.  Skin: Skin color, texture, turgor normal. No rashes seen  Psych: Appropriate mood and affect.  Neurologic: Mental status: Alert, oriented to person, place, and time, thought content appropriate.    Results for orders placed or performed in visit on 06/24/23  POCT glycosylated hemoglobin (Hb A1C)  Result Value Ref Range   Hemoglobin A1C 6.3 (A) 4.0 - 5.6 %    Assessment & Plan        Type 2 Diabetes Mellitus A1c slightly elevated at 6.3, likely due to recent medication supply issues. Discussed the potential for improved control with increased dose of Mounjaro. -Plan to increase Mounjaro to 10mg  weekly once available at the pharmacy. -Order sent to  [facility] on [address]. -Check A1c in 3 months to assess response to increased dose.  Chronic Pain Reports taking Oxycodone/apap for generalized aches and pains, including stomach and back pain. Patient is compliant with prescribed regimen. -Continue Oxycodone/apap as prescribed. -Discussed potential for dose reduction with continued weight loss.  Gallbladder Disease Reports recent gallbladder attack. Previous recommendation for cholecystectomy deferred due to morbid obesity. -Encourage continued weight loss to facilitate potential future cholecystectomy.  General Health Maintenance -Collect urine sample today for uACR and drug screening -Order comprehensive metabolic panel, lipid panel, and complete blood count for annual check.       Mila Merry, MD  [facility] [phone] (phone) [phone] (fax)  [facility]

## 2023-06-25 LAB — CBC
Hematocrit: 34.1 % (ref 34.0–46.6)
Hemoglobin: 10.9 g/dL — ABNORMAL LOW (ref 11.1–15.9)
MCH: 25.1 pg — ABNORMAL LOW (ref 26.6–33.0)
MCHC: 32 g/dL (ref 31.5–35.7)
MCV: 78 fL — ABNORMAL LOW (ref 79–97)
Platelets: 394 10*3/uL (ref 150–450)
RBC: 4.35 x10E6/uL (ref 3.77–5.28)
RDW: 14.4 % (ref 11.7–15.4)
WBC: 7.6 10*3/uL (ref 3.4–10.8)

## 2023-06-25 LAB — COMPREHENSIVE METABOLIC PANEL
ALT: 10 IU/L (ref 0–32)
AST: 12 IU/L (ref 0–40)
Albumin: 4.3 g/dL (ref 3.9–4.9)
Alkaline Phosphatase: 78 IU/L (ref 44–121)
BUN/Creatinine Ratio: 12 (ref 9–23)
BUN: 13 mg/dL (ref 6–24)
Bilirubin Total: 0.2 mg/dL (ref 0.0–1.2)
CO2: 24 mmol/L (ref 20–29)
Calcium: 9.5 mg/dL (ref 8.7–10.2)
Chloride: 98 mmol/L (ref 96–106)
Creatinine, Ser: 1.07 mg/dL — ABNORMAL HIGH (ref 0.57–1.00)
Globulin, Total: 3.5 g/dL (ref 1.5–4.5)
Glucose: 106 mg/dL — ABNORMAL HIGH (ref 70–99)
Potassium: 4.5 mmol/L (ref 3.5–5.2)
Sodium: 137 mmol/L (ref 134–144)
Total Protein: 7.8 g/dL (ref 6.0–8.5)
eGFR: 66 mL/min/{1.73_m2} (ref >59)

## 2023-06-25 LAB — LIPID PANEL
Chol/HDL Ratio: 4.7 ratio — ABNORMAL HIGH (ref 0.0–4.4)
Cholesterol, Total: 168 mg/dL (ref 100–199)
HDL: 36 mg/dL — ABNORMAL LOW (ref >39)
LDL Chol Calc (NIH): 104 mg/dL — ABNORMAL HIGH (ref 0–99)
Triglycerides: 160 mg/dL — ABNORMAL HIGH (ref 0–149)
VLDL Cholesterol Cal: 28 mg/dL (ref 5–40)

## 2023-06-25 LAB — TSH: TSH: 0.384 u[IU]/mL — ABNORMAL LOW (ref 0.450–4.500)

## 2023-06-26 LAB — PAIN MGT SCRN (14 DRUGS), UR
Amphetamine Scrn, Ur: NEGATIVE ng/mL
BARBITURATE SCREEN URINE: NEGATIVE ng/mL
BENZODIAZEPINE SCREEN, URINE: NEGATIVE ng/mL
Buprenorphine, Urine: NEGATIVE ng/mL
CANNABINOIDS UR QL SCN: NEGATIVE ng/mL
Cocaine (Metab) Scrn, Ur: NEGATIVE ng/mL
Creatinine(Crt), U: 353.9 mg/dL — ABNORMAL HIGH (ref 20.0–300.0)
Fentanyl, Urine: NEGATIVE pg/mL
Meperidine Screen, Urine: NEGATIVE ng/mL
Methadone Screen, Urine: NEGATIVE ng/mL
OXYCODONE+OXYMORPHONE UR QL SCN: POSITIVE ng/mL — AB
Opiate Scrn, Ur: POSITIVE ng/mL — AB
Ph of Urine: 5.2 (ref 4.5–8.9)
Phencyclidine Qn, Ur: NEGATIVE ng/mL
Propoxyphene Scrn, Ur: NEGATIVE ng/mL
Tramadol Screen, Urine: NEGATIVE ng/mL

## 2023-06-26 LAB — MICROALBUMIN / CREATININE URINE RATIO
Creatinine, Urine: 367.1 mg/dL
Microalb/Creat Ratio: 4 mg/g{creat} (ref 0–29)
Microalbumin, Urine: 15.9 ug/mL

## 2023-06-28 ENCOUNTER — Other Ambulatory Visit: Payer: Self-pay | Admitting: Family Medicine

## 2023-06-28 DIAGNOSIS — R1011 Right upper quadrant pain: Secondary | ICD-10-CM

## 2023-06-28 MED ORDER — OXYCODONE-ACETAMINOPHEN 7.5-325 MG PO TABS: 1.0000 | ORAL_TABLET | Freq: Four times a day (QID) | ORAL | 0 refills | Status: DC | PRN

## 2023-07-02 ENCOUNTER — Other Ambulatory Visit: Payer: Self-pay | Admitting: Family Medicine

## 2023-07-02 DIAGNOSIS — I10 Essential (primary) hypertension: Secondary | ICD-10-CM

## 2023-07-02 DIAGNOSIS — F439 Reaction to severe stress, unspecified: Secondary | ICD-10-CM

## 2023-07-05 ENCOUNTER — Other Ambulatory Visit: Payer: Self-pay | Admitting: Family Medicine

## 2023-07-05 DIAGNOSIS — M7661 Achilles tendinitis, right leg: Secondary | ICD-10-CM

## 2023-07-24 ENCOUNTER — Other Ambulatory Visit: Payer: Self-pay | Admitting: Family Medicine

## 2023-07-24 DIAGNOSIS — R1011 Right upper quadrant pain: Secondary | ICD-10-CM

## 2023-07-24 DIAGNOSIS — I10 Essential (primary) hypertension: Secondary | ICD-10-CM

## 2023-07-25 MED ORDER — OXYCODONE-ACETAMINOPHEN 7.5-325 MG PO TABS: 1.0000 | ORAL_TABLET | Freq: Four times a day (QID) | ORAL | 0 refills | Status: DC | PRN

## 2023-07-25 NOTE — Telephone Encounter (Signed)
Requested Prescriptions  Pending Prescriptions Disp Refills   lisinopril-hydrochlorothiazide (ZESTORETIC) 10-12.5 MG tablet [Pharmacy Med Name: LISINOPRIL-HCTZ 10/12.5MG  TABLETS] 90 tablet 0    Sig: TAKE 1 TABLET BY MOUTH DAILY     Cardiovascular:  ACEI + Diuretic Combos Failed - 07/24/2023 10:37 PM      Failed - Cr in normal range and within 180 days    Creat  Date Value Ref Range Status  07/06/2017 0.96 0.50 - 1.10 mg/dL Final   Creatinine, Ser  Date Value Ref Range Status  06/24/2023 1.07 (H) 0.57 - 1.00 mg/dL Final         Passed - Na in normal range and within 180 days    Sodium  Date Value Ref Range Status  06/24/2023 137 134 - 144 mmol/L Final  02/08/2015 138 mmol/L Final    Comment:    135-145 NOTE: New Reference Range  12/24/14          Passed - K in normal range and within 180 days    Potassium  Date Value Ref Range Status  06/24/2023 4.5 3.5 - 5.2 mmol/L Final  02/08/2015 3.6 mmol/L Final    Comment:    3.5-5.1 NOTE: New Reference Range  12/24/14          Passed - eGFR is 30 or above and within 180 days    GFR, Est African American  Date Value Ref Range Status  07/06/2017 87 > OR = 60 mL/min/1.72m2 Final   GFR calc Af Amer  Date Value Ref Range Status  09/25/2019 96 >59 mL/min/1.73 Final   GFR, Est Non African American  Date Value Ref Range Status  07/06/2017 75 > OR = 60 mL/min/1.24m2 Final   GFR calc non Af Amer  Date Value Ref Range Status  09/25/2019 84 >59 mL/min/1.73 Final   eGFR  Date Value Ref Range Status  06/24/2023 66 >59 mL/min/1.73 Final         Passed - Patient is not pregnant      Passed - Last BP in normal range    BP Readings from Last 1 Encounters:  06/24/23 124/63         Passed - Valid encounter within last 6 months    Recent Outpatient Visits           1 month ago Chronic cholecystitis   [place] [facility] Malva Limes, MD   5 months ago Type 2 diabetes mellitus with morbid obesity [facility])    [place] [facility] Malva Limes, MD   8 months ago COVID-19   [place] [facility] Merita Norton T, FNP   9 months ago Type 2 diabetes mellitus with morbid obesity [facility])   [place] [facility] Malva Limes, MD   1 year ago Type 2 diabetes mellitus with morbid obesity [facility])   [place] [facility] Malva Limes, MD

## 2023-08-03 ENCOUNTER — Other Ambulatory Visit: Payer: Self-pay | Admitting: Family Medicine

## 2023-08-03 DIAGNOSIS — R490 Dysphonia: Secondary | ICD-10-CM

## 2023-08-04 NOTE — Telephone Encounter (Signed)
Reordered 06/06/23 #90   Requested Prescriptions  Refused Prescriptions Disp Refills   omeprazole (PRILOSEC) 20 MG capsule [Pharmacy Med Name: OMEPRAZOLE 20MG  CAPSULES] 30 capsule     Sig: TAKE 1 CAPSULE(20 MG) BY MOUTH DAILY     Gastroenterology: Proton Pump Inhibitors Passed - 08/03/2023  7:03 PM      Passed - Valid encounter within last 12 months    Recent Outpatient Visits           1 month ago Chronic cholecystitis   [place] [facility] Malva Limes, MD   5 months ago Type 2 diabetes mellitus with morbid obesity [facility])   [place] [facility] Malva Limes, MD   9 months ago COVID-19   [facility] Merita Norton T, FNP   10 months ago Type 2 diabetes mellitus with morbid obesity [facility])   [place] [facility] Malva Limes, MD   1 year ago Type 2 diabetes mellitus with morbid obesity [facility])   [place] [facility] Sherrie Mustache, Demetrios Isaacs, MD

## 2023-08-22 ENCOUNTER — Other Ambulatory Visit: Payer: Self-pay | Admitting: Family Medicine

## 2023-08-22 DIAGNOSIS — R1011 Right upper quadrant pain: Secondary | ICD-10-CM

## 2023-08-22 MED ORDER — OXYCODONE-ACETAMINOPHEN 7.5-325 MG PO TABS: 1.0000 | ORAL_TABLET | Freq: Four times a day (QID) | ORAL | 0 refills | Status: DC | PRN

## 2023-09-20 ENCOUNTER — Other Ambulatory Visit: Payer: Self-pay | Admitting: Family Medicine

## 2023-09-20 DIAGNOSIS — R1011 Right upper quadrant pain: Secondary | ICD-10-CM

## 2023-09-21 MED ORDER — OXYCODONE-ACETAMINOPHEN 7.5-325 MG PO TABS: 1.0000 | ORAL_TABLET | Freq: Four times a day (QID) | ORAL | 0 refills | Status: DC | PRN

## 2023-09-23 ENCOUNTER — Ambulatory Visit: Payer: 59 | Admitting: Family Medicine

## 2023-09-23 VITALS — BP 130/78 | HR 102 | Temp 98.6°F | Ht 66.0 in | Wt 351.0 lb

## 2023-09-23 DIAGNOSIS — J039 Acute tonsillitis, unspecified: Secondary | ICD-10-CM | POA: Diagnosis not present

## 2023-09-23 DIAGNOSIS — B379 Candidiasis, unspecified: Secondary | ICD-10-CM | POA: Diagnosis not present

## 2023-09-23 DIAGNOSIS — T3695XA Adverse effect of unspecified systemic antibiotic, initial encounter: Secondary | ICD-10-CM | POA: Diagnosis not present

## 2023-09-23 DIAGNOSIS — J029 Acute pharyngitis, unspecified: Secondary | ICD-10-CM

## 2023-09-23 LAB — POCT RAPID STREP A (OFFICE): Rapid Strep A Screen: NEGATIVE

## 2023-09-23 MED ORDER — FLUCONAZOLE 100 MG PO TABS
100.0000 mg | ORAL_TABLET | Freq: Every day | ORAL | 0 refills | Status: DC
Start: 2023-09-23 — End: 2023-12-22

## 2023-09-23 MED ORDER — PENICILLIN V POTASSIUM 500 MG PO TABS
500.0000 mg | ORAL_TABLET | Freq: Three times a day (TID) | ORAL | 0 refills | Status: DC
Start: 2023-09-23 — End: 2023-12-22

## 2023-09-23 NOTE — Progress Notes (Signed)
Established patient visit   Patient: Jessica Fields   DOB: 01-Jan-1981   42 y.o. Female  MRN: [id number] Visit Date: 09/23/2023  Today's healthcare provider: Sherlyn Hay, DO   Chief Complaint  Patient presents with   Sore Throat    Patient had a couple of days of hoarseness and then yesterday and today she has had sore throat.  Today she feels run down and has slight drainage down the back of her throat.  Slight cough, felt feverish but thermometer is broken.  She said it hurts to swallow.  She has been using both Dayquil and NyQuil.   Subjective    Sore Throat   The patient presents with a chief complaint of hoarseness and sore throat, which started a couple of days ago. She reports being around colleagues at work who had similar symptoms. The patient has been self-medicating with Dayquil, Nyquil, and Chloraseptic throat spray, and has also been doing salt water gargles. She reports feeling fatigued, with activities such as drying her hair feeling troublesome.  The patient experienced chills the previous night and attempted to take her temperature, but her thermometer was broken. She denies any chest pain but reports an episode of nausea and vomiting two nights ago, which lasted for a couple of hours. She has not had any further episodes since then.  The patient has been coughing and producing a small amount of sputum. She denies any nasal discharge but feels that there may be postnasal drip. She also reports bloody discharge, which she attributes to her CPAP machine. She does not have a humidifier attached to her CPAP machine.  The patient has a history of strep throat and still has her tonsils. The patient has a history of developing yeast infections following antibiotic therapy, so a prescription for Diflucan has also been provided to be used at the end of the antibiotic course.     Medications: Outpatient Medications Prior to Visit  Medication Sig   amLODipine (NORVASC) 10  MG tablet TAKE 1 TABLET(10 MG) BY MOUTH DAILY   celecoxib (CELEBREX) 200 MG capsule TAKE 1 CAPSULE BY MOUTH TWICE DAILY   diclofenac sodium (VOLTAREN) 1 % GEL Apply 4 g topically 4 (four) times daily as needed.   fluticasone (FLONASE) 50 MCG/ACT nasal spray Place 2 sprays into both nostrils daily as needed.   lisinopril-hydrochlorothiazide (ZESTORETIC) 10-12.5 MG tablet TAKE 1 TABLET BY MOUTH DAILY   metFORMIN (GLUCOPHAGE) 500 MG tablet TAKE 1 TABLET(500 MG) BY MOUTH THREE TIMES DAILY WITH MEALS   omeprazole (PRILOSEC) 20 MG capsule TAKE 1 CAPSULE(20 MG) BY MOUTH DAILY   oxyCODONE-acetaminophen (PERCOCET) 7.5-325 MG tablet Take 1 tablet by mouth every 6 (six) hours as needed for severe pain (pain score 7-10).   promethazine (PHENERGAN) 25 MG tablet Take 1 tablet (25 mg total) by mouth every 8 (eight) hours as needed for nausea or vomiting.   sertraline (ZOLOFT) 50 MG tablet TAKE 1 TABLET(50 MG) BY MOUTH DAILY   tirzepatide (MOUNJARO) 10 MG/0.5ML Pen Inject 10 mg into the skin once a week.   No facility-administered medications prior to visit.        Objective    BP 130/78 (BP Location: Left Arm, Patient Position: Sitting, Cuff Size: Large)   Pulse (!) 102   Temp 98.6 F (37 C) (Oral)   Ht 5\' 6"  (1.676 m)   Wt (!) 351 lb (159.2 kg)   SpO2 99%   BMI 56.65 kg/m  Physical Exam Constitutional:      Appearance: Normal appearance.  HENT:     Head: Normocephalic and atraumatic.     Right Ear: Tympanic membrane and ear canal normal.     Left Ear: Tympanic membrane and ear canal normal.     Mouth/Throat:     Mouth: Mucous membranes are moist.     Pharynx: Oropharyngeal exudate and posterior oropharyngeal erythema present.     Tonsils: Tonsillar exudate present. No tonsillar abscesses. 2+ on the right. 2+ on the left.  Eyes:     General: No scleral icterus.    Extraocular Movements: Extraocular movements intact.     Conjunctiva/sclera: Conjunctivae normal.  Cardiovascular:      Rate and Rhythm: Normal rate and regular rhythm.     Pulses: Normal pulses.     Heart sounds: Normal heart sounds.  Pulmonary:     Effort: Pulmonary effort is normal. No respiratory distress.     Breath sounds: Normal breath sounds.  Abdominal:     General: Bowel sounds are normal.     Palpations: Abdomen is soft.  Musculoskeletal:     Right lower leg: No edema.     Left lower leg: No edema.  Skin:    General: Skin is warm and dry.  Neurological:     Mental Status: She is alert and oriented to person, place, and time. Mental status is at baseline.  Psychiatric:        Mood and Affect: Mood normal.        Behavior: Behavior normal.      Results for orders placed or performed in visit on 09/23/23  POCT rapid strep A  Result Value Ref Range   Rapid Strep A Screen Negative Negative    Assessment & Plan    Pharyngotonsillitis -     Penicillin V Potassium; Take 1 tablet (500 mg total) by mouth 3 (three) times daily.  Dispense: 30 tablet; Refill: 0 -     POCT rapid strep A  Antibiotic-induced yeast infection -     Fluconazole; Take 1 tablet (100 mg total) by mouth daily. Take after completing penicillin  Dispense: 1 tablet; Refill: 0  Presents with hoarseness, sore throat, and significant fatigue for a couple of days. Physical examination reveals a red throat with drainage. Differential diagnosis includes viral vs. bacterial pharyngitis. Given the severity of symptoms and drainage, bacterial pharyngitis is suspected. Rapid strep test negative; however will proceed with presumptive treatment. Informed about antibiotic risks, including potential for yeast infections, and benefits of early treatment. No known drug allergies. - Prescribe penicillin 3 times a day for 10 days - Prescribe Diflucan to be taken at the end of the antibiotic course to prevent yeast infection, as patient reports significant history of the same.   Return if symptoms worsen or fail to improve.      I discussed  the assessment and treatment plan with the patient  The patient was provided an opportunity to ask questions and all were answered. The patient agreed with the plan and demonstrated an understanding of the instructions.   The patient was advised to call back or seek an in-person evaluation if the symptoms worsen or if the condition fails to improve as anticipated.    Sherlyn Hay, DO  [facility] [phone] (phone) [phone] (fax)  [facility]

## 2023-10-11 ENCOUNTER — Encounter: Payer: Self-pay | Admitting: Family Medicine

## 2023-10-19 ENCOUNTER — Other Ambulatory Visit: Payer: Self-pay | Admitting: Family Medicine

## 2023-10-19 DIAGNOSIS — R1011 Right upper quadrant pain: Secondary | ICD-10-CM

## 2023-10-20 MED ORDER — OXYCODONE-ACETAMINOPHEN 7.5-325 MG PO TABS: 1.0000 | ORAL_TABLET | Freq: Four times a day (QID) | ORAL | 0 refills | Status: DC | PRN

## 2023-10-22 ENCOUNTER — Other Ambulatory Visit: Payer: Self-pay | Admitting: Family Medicine

## 2023-10-22 DIAGNOSIS — I10 Essential (primary) hypertension: Secondary | ICD-10-CM

## 2023-10-25 NOTE — Telephone Encounter (Signed)
 Requested Prescriptions  Pending Prescriptions Disp Refills   lisinopril -hydrochlorothiazide  (ZESTORETIC ) 10-12.5 MG tablet [Pharmacy Med Name: LISINOPRIL -HCTZ 10/12.5MG  TABLETS] 90 tablet 0    Sig: TAKE 1 TABLET BY MOUTH DAILY     Cardiovascular:  ACEI + Diuretic Combos Failed - 10/25/2023  2:25 PM      Failed - Cr in normal range and within 180 days    Creat  Date Value Ref Range Status  07/06/2017 0.96 0.50 - 1.10 mg/dL Final   Creatinine, Ser  Date Value Ref Range Status  06/24/2023 1.07 (H) 0.57 - 1.00 mg/dL Final         Passed - Na in normal range and within 180 days    Sodium  Date Value Ref Range Status  06/24/2023 137 134 - 144 mmol/L Final  02/08/2015 138 mmol/L Final    Comment:    135-145 NOTE: New Reference Range  12/24/14          Passed - K in normal range and within 180 days    Potassium  Date Value Ref Range Status  06/24/2023 4.5 3.5 - 5.2 mmol/L Final  02/08/2015 3.6 mmol/L Final    Comment:    3.5-5.1 NOTE: New Reference Range  12/24/14          Passed - eGFR is 30 or above and within 180 days    GFR, Est African American  Date Value Ref Range Status  07/06/2017 87 > OR = 60 mL/min/1.57m2 Final   GFR calc Af Amer  Date Value Ref Range Status  09/25/2019 96 >59 mL/min/1.73 Final   GFR, Est Non African American  Date Value Ref Range Status  07/06/2017 75 > OR = 60 mL/min/1.47m2 Final   GFR calc non Af Amer  Date Value Ref Range Status  09/25/2019 84 >59 mL/min/1.73 Final   eGFR  Date Value Ref Range Status  06/24/2023 66 >59 mL/min/1.73 Final         Passed - Patient is not pregnant      Passed - Last BP in normal range    BP Readings from Last 1 Encounters:  09/23/23 130/78         Passed - Valid encounter within last 6 months    Recent Outpatient Visits           1 month ago Pharyngotonsillitis   [facility] [place], Lauraine SAILOR, DO   4 months ago Chronic cholecystitis   [place] [facility] Gasper Nancyann BRAVO, MD   8 months ago Type 2 diabetes mellitus with morbid obesity [facility])   [place] [facility] Gasper Nancyann BRAVO, MD   11 months ago COVID-19   [place] Emilio Marseille T, FNP   1 year ago Type 2 diabetes mellitus with morbid obesity [facility])    [facility] Gasper Nancyann BRAVO, MD

## 2023-11-16 ENCOUNTER — Other Ambulatory Visit: Payer: Self-pay | Admitting: Family Medicine

## 2023-11-16 DIAGNOSIS — R1011 Right upper quadrant pain: Secondary | ICD-10-CM

## 2023-11-18 MED ORDER — OXYCODONE-ACETAMINOPHEN 7.5-325 MG PO TABS: 1.0000 | ORAL_TABLET | Freq: Four times a day (QID) | ORAL | 0 refills | Status: DC | PRN

## 2023-12-09 ENCOUNTER — Emergency Department: Payer: 59

## 2023-12-09 ENCOUNTER — Emergency Department
Admission: EM | Admit: 2023-12-09 | Discharge: 2023-12-09 | Disposition: A | Discharge status: Home / Self Care | Payer: 59 | Attending: Emergency Medicine | Admitting: Emergency Medicine

## 2023-12-09 ENCOUNTER — Encounter: Payer: Self-pay | Admitting: Emergency Medicine

## 2023-12-09 ENCOUNTER — Other Ambulatory Visit: Payer: Self-pay

## 2023-12-09 DIAGNOSIS — R1907 Generalized intra-abdominal and pelvic swelling, mass and lump: Secondary | ICD-10-CM | POA: Insufficient documentation

## 2023-12-09 DIAGNOSIS — R1030 Lower abdominal pain, unspecified: Secondary | ICD-10-CM | POA: Diagnosis present

## 2023-12-09 DIAGNOSIS — D649 Anemia, unspecified: Secondary | ICD-10-CM

## 2023-12-09 DIAGNOSIS — K668 Other specified disorders of peritoneum: Secondary | ICD-10-CM

## 2023-12-09 LAB — URINALYSIS, ROUTINE W REFLEX MICROSCOPIC
Bilirubin Urine: NEGATIVE
Glucose, UA: NEGATIVE mg/dL
Ketones, ur: NEGATIVE mg/dL
Leukocytes,Ua: NEGATIVE
Nitrite: NEGATIVE
Protein, ur: NEGATIVE mg/dL
Specific Gravity, Urine: 1.025 (ref 1.005–1.030)
pH: 5 (ref 5.0–8.0)

## 2023-12-09 LAB — IRON AND TIBC
Iron: 16 ug/dL — ABNORMAL LOW (ref 28–170)
Saturation Ratios: 7 % — ABNORMAL LOW (ref 10.4–31.8)
TIBC: 227 ug/dL — ABNORMAL LOW (ref 250–450)
UIBC: 211 ug/dL

## 2023-12-09 LAB — LIPASE, BLOOD: Lipase: 21 U/L (ref 11–51)

## 2023-12-09 LAB — COMPREHENSIVE METABOLIC PANEL
ALT: 11 U/L (ref 0–44)
AST: 12 U/L — ABNORMAL LOW (ref 15–41)
Albumin: 2.7 g/dL — ABNORMAL LOW (ref 3.5–5.0)
Alkaline Phosphatase: 78 U/L (ref 38–126)
Anion gap: 13 (ref 5–15)
BUN: 18 mg/dL (ref 6–20)
CO2: 24 mmol/L (ref 22–32)
Calcium: 8.7 mg/dL — ABNORMAL LOW (ref 8.9–10.3)
Chloride: 100 mmol/L (ref 98–111)
Creatinine, Ser: 0.98 mg/dL (ref 0.44–1.00)
GFR, Estimated: >60 mL/min (ref >60)
Glucose, Bld: 135 mg/dL — ABNORMAL HIGH (ref 70–99)
Potassium: 3.7 mmol/L (ref 3.5–5.1)
Sodium: 137 mmol/L (ref 135–145)
Total Bilirubin: 0.5 mg/dL (ref 0.0–1.2)
Total Protein: 8.5 g/dL — ABNORMAL HIGH (ref 6.5–8.1)

## 2023-12-09 LAB — TYPE AND SCREEN
ABO/RH(D): O POS
Antibody Screen: NEGATIVE

## 2023-12-09 LAB — RETICULOCYTES
Immature Retic Fract: 12.3 % (ref 2.3–15.9)
RBC.: 3.45 MIL/uL — ABNORMAL LOW (ref 3.87–5.11)
Retic Count, Absolute: 16.9 10*3/uL — ABNORMAL LOW (ref 19.0–186.0)
Retic Ct Pct: 0.5 % (ref 0.4–3.1)

## 2023-12-09 LAB — CBC
HCT: 26.3 % — ABNORMAL LOW (ref 36.0–46.0)
Hemoglobin: 8.2 g/dL — ABNORMAL LOW (ref 12.0–15.0)
MCH: 23.8 pg — ABNORMAL LOW (ref 26.0–34.0)
MCHC: 31.2 g/dL (ref 30.0–36.0)
MCV: 76.2 fL — ABNORMAL LOW (ref 80.0–100.0)
Platelets: 537 10*3/uL — ABNORMAL HIGH (ref 150–400)
RBC: 3.45 MIL/uL — ABNORMAL LOW (ref 3.87–5.11)
RDW: 15.1 % (ref 11.5–15.5)
WBC: 17.9 10*3/uL — ABNORMAL HIGH (ref 4.0–10.5)
nRBC: 0 % (ref 0.0–0.2)

## 2023-12-09 LAB — HCG, QUANTITATIVE, PREGNANCY: hCG, Beta Chain, Quant, S: 1 m[IU]/mL (ref <5)

## 2023-12-09 LAB — POC URINE PREG, ED: Preg Test, Ur: NEGATIVE

## 2023-12-09 MED ORDER — GADOBUTROL 1 MMOL/ML IV SOLN
10.0000 mL | Freq: Once | INTRAVENOUS | Status: AC | PRN
  Administered 2023-12-09: 10 mL via INTRAVENOUS

## 2023-12-09 MED ORDER — KETOROLAC TROMETHAMINE 30 MG/ML IJ SOLN
30.0000 mg | Freq: Once | INTRAMUSCULAR | Status: AC
  Administered 2023-12-09: 30 mg via INTRAMUSCULAR
  Filled 2023-12-09: qty 1

## 2023-12-09 NOTE — ED Notes (Signed)
 Pt resting in bed, denies any needs at this time.  Pt updated on plan of care

## 2023-12-09 NOTE — Discharge Instructions (Addendum)
 Your hemoglobin was low at 8- if it gets below 7 you need a blood tranfusion- Follow up for recheck of hemoglobin with PCP one week for recheck or return here if developing new symptoms.

## 2023-12-09 NOTE — ED Provider Notes (Signed)
 [facility] Provider Note    Event Date/Time   First MD Initiated Contact with Patient 12/09/23 1000     (approximate)   History   Abdominal Pain and Vaginal Bleeding   HPI  Jessica Fields is a 43 y.o. female with history of ovarian cyst, rupture, endometriosis, IUD placed 6 years ago who comes in with lower abdominal pain.  Patient reports over the past 10 days she has had intermittent light bleeding.  She reports developing some lower pelvic pressure and pain associated with that.  She states it feels like her prior endometriosis pain but she has not really had an issue with that since her IUD was placed.  She reports being sexually active with husband only and denies any concerns for STDs or new vaginal discharge other than the bleeding.   Physical Exam   Triage Vital Signs: ED Triage Vitals  Encounter Vitals Group     BP 12/09/23 0942 129/75     Systolic BP Percentile --      Diastolic BP Percentile --      Pulse Rate 12/09/23 0942 (!) 107     Resp 12/09/23 0942 18     Temp 12/09/23 0942 98.6 F (37 C)     Temp Source 12/09/23 0942 Oral     SpO2 12/09/23 0942 98 %     Weight 12/09/23 0941 (!) 340 lb (154.2 kg)     Height 12/09/23 0941 5\' 6"  (1.676 m)     Head Circumference --      Peak Flow --      Pain Score 12/09/23 0940 5     Pain Loc --      Pain Education --      Exclude from Growth Chart --     Most recent vital signs: Vitals:   12/09/23 0942  BP: 129/75  Pulse: (!) 107  Resp: 18  Temp: 98.6 F (37 C)  SpO2: 98%     General: Awake, no distress.  CV:  Good peripheral perfusion.  Resp:  Normal effort.  Abd:  No distention.  Slight lower abdominal discomfort Other:     ED Results / Procedures / Treatments   Labs (all labs ordered are listed, but only abnormal results are displayed) Labs Reviewed  CBC - Abnormal; Notable for the following components:      Result Value   WBC 17.9 (*)    RBC 3.45 (*)    Hemoglobin 8.2  (*)    HCT 26.3 (*)    MCV 76.2 (*)    MCH 23.8 (*)    Platelets 537 (*)    All other components within normal limits  URINALYSIS, ROUTINE W REFLEX MICROSCOPIC - Abnormal; Notable for the following components:   Color, Urine YELLOW (*)    APPearance HAZY (*)    Hgb urine dipstick SMALL (*)    Bacteria, UA MANY (*)    All other components within normal limits  LIPASE, BLOOD  COMPREHENSIVE METABOLIC PANEL  POC URINE PREG, ED    RADIOLOGY I have reviewed the ultrasound personally interpreted and no evidence of cysts   PROCEDURES:  Critical Care performed: No  Procedures   MEDICATIONS ORDERED IN ED: Medications  ketorolac (TORADOL) 30 MG/ML injection 30 mg (30 mg Intramuscular Given 12/09/23 1025)     IMPRESSION / MDM / ASSESSMENT AND PLAN / ED COURSE  I reviewed the triage vital signs and the nursing notes.   Patient's presentation is most consistent  with acute presentation with potential threat to life or bodily function.    Differential is IUD misplacement, cyst, torsion, rupture  Pregnancy test negative lipase normal CMP shows reassuring creatinine.  CBC shows elevated white count.  Hemoglobin is slightly low at 8.2 urine with many bacteria and it hCG negative  Other findings   9.3 x 9.1 x 8.7 cm mildly complex cystic lesion is noted posteriorly in the midline of the pelvis. This may be ovarian in etiology, but it cannot be determined from which ovary it arises, if any. Potentially may represent endometrioma.   IMPRESSION: Ovaries are not clearly visualized and therefore ovarian torsion cannot be excluded on the basis of this exam.   9.3 x 9.1 x 8.7 cm mildly complex cystic lesion is noted posteriorly in the midline of the pelvis. This may be ovarian etiology, but it cannot be determined from which ovary it arises, if any. Potentially this may represent endometrioma. MRI is recommended for further evaluation.   Probable 4.9 cm fibroid noted posteriorly and  uterus.  Discussed with radiologist and he stated was limited to see if there was the IUD was in place given the cystic mass and body habitus.  Should be able to see on MRI.  Recommended MRI with and without contrast  Updated patient on need for MRI.  Patient declines need anything for pain.  I did add on some iron studies, reticulocyte given her low hemoglobin.  Patient be handed off to oncoming team pending MRI  FINAL CLINICAL IMPRESSION(S) / ED DIAGNOSES   Final diagnoses:  Cystic lesion of abdominal viscera     Rx / DC Orders   ED Discharge Orders     None        Note:  This document was prepared using Dragon voice recognition software and may include unintentional dictation errors.   Concha Se, MD 12/09/23 [phone]

## 2023-12-09 NOTE — ED Notes (Signed)
 Pt at MRI

## 2023-12-09 NOTE — ED Triage Notes (Signed)
 Pt arrived via POV with c/o low abdominal pain, pt states she is having pelvic pressure, states she has an IUD in place that is probably due to be changed, states she has been bleeding x 10 days, states the pain feels similar to endometrosis pain which is why the IUD is in place.

## 2023-12-12 ENCOUNTER — Encounter: Payer: Self-pay | Admitting: Family Medicine

## 2023-12-12 ENCOUNTER — Ambulatory Visit: Payer: 59 | Admitting: Family Medicine

## 2023-12-12 ENCOUNTER — Ambulatory Visit (INDEPENDENT_AMBULATORY_CARE_PROVIDER_SITE_OTHER): Payer: 59 | Admitting: Family Medicine

## 2023-12-12 ENCOUNTER — Other Ambulatory Visit
Admission: RE | Admit: 2023-12-12 | Discharge: 2023-12-12 | Disposition: A | Discharge status: Home / Self Care | Payer: 59 | Source: Ambulatory Visit | Attending: Family Medicine | Admitting: Family Medicine

## 2023-12-12 VITALS — BP 106/56 | HR 103 | Temp 98.8°F | Ht 66.0 in | Wt 343.0 lb

## 2023-12-12 DIAGNOSIS — D500 Iron deficiency anemia secondary to blood loss (chronic): Secondary | ICD-10-CM | POA: Diagnosis not present

## 2023-12-12 DIAGNOSIS — R000 Tachycardia, unspecified: Secondary | ICD-10-CM | POA: Diagnosis not present

## 2023-12-12 DIAGNOSIS — N939 Abnormal uterine and vaginal bleeding, unspecified: Secondary | ICD-10-CM

## 2023-12-12 DIAGNOSIS — R1011 Right upper quadrant pain: Secondary | ICD-10-CM | POA: Diagnosis not present

## 2023-12-12 LAB — CBC
HCT: 27.3 % — ABNORMAL LOW (ref 36.0–46.0)
Hemoglobin: 8.4 g/dL — ABNORMAL LOW (ref 12.0–15.0)
MCH: 23.7 pg — ABNORMAL LOW (ref 26.0–34.0)
MCHC: 30.8 g/dL (ref 30.0–36.0)
MCV: 77.1 fL — ABNORMAL LOW (ref 80.0–100.0)
Platelets: 611 10*3/uL — ABNORMAL HIGH (ref 150–400)
RBC: 3.54 MIL/uL — ABNORMAL LOW (ref 3.87–5.11)
RDW: 15 % (ref 11.5–15.5)
WBC: 16.9 10*3/uL — ABNORMAL HIGH (ref 4.0–10.5)
nRBC: 0 % (ref 0.0–0.2)

## 2023-12-12 MED ORDER — OXYCODONE-ACETAMINOPHEN 7.5-325 MG PO TABS: 2.0000 | ORAL_TABLET | Freq: Four times a day (QID) | ORAL | 0 refills | Status: DC | PRN

## 2023-12-12 NOTE — Progress Notes (Signed)
 Established patient visit   Patient: Jessica Fields   DOB: 1981-08-17   43 y.o. Female  MRN: [id number] Visit Date: 12/12/2023  Today's healthcare provider: Mila Merry, MD   Chief Complaint  Patient presents with   ER follow up    Patient was seen in the ER on 12/09/23 and found to have Hgb of 8.2 and was advised to have it repeated today.  Patient reports feeling run down and fatigued.  She denies any shortness of breath   Subjective    Discussed the use of AI scribe software for clinical note transcription with the patient, who gave verbal consent to proceed.  History of Present Illness   Jessica Fields is a 43 year old female who presents with abdominal pain and abnormal vaginal bleeding.  She initially experienced abdominal pain and abnormal bleeding over a week ago. She hoped would resolve spontaneously as it had in the past. However, the persistence of these symptoms led her to seek medical attention. She first visited urgent care and was subsequently directed to the emergency room. Blood work conducted there revealed a drop in her hemoglobin levels to 8.2. She was discharged with instructions to follow up on her hemoglobin levels. she had MRI showing large complex cystic pelvic mass. She reports history of endometriosis for which she had IUD placed at [facility] many years ago which has been working very well. She states she was advised that she would get a call for [facility] to schedule a gyn appointment, but she has still not heard from them.   She reports bleeding and pain have not changed since ER visit. She experiences extreme fatigue, having slept most of the weekend, and reports mild dizziness and lightheadedness. Her appetite is poor, and she is dealing with significant pain. She was previously on Celebrex for pain management and has been taking an increased dosage, leading to faster consumption of her prescription. There have been no significant changes in the severity of her bleeding.        Medications: Outpatient Medications Prior to Visit  Medication Sig   amLODipine (NORVASC) 10 MG tablet TAKE 1 TABLET(10 MG) BY MOUTH DAILY   celecoxib (CELEBREX) 200 MG capsule TAKE 1 CAPSULE BY MOUTH TWICE DAILY   diclofenac sodium (VOLTAREN) 1 % GEL Apply 4 g topically 4 (four) times daily as needed.   fluconazole (DIFLUCAN) 100 MG tablet Take 1 tablet (100 mg total) by mouth daily. Take after completing penicillin   fluticasone (FLONASE) 50 MCG/ACT nasal spray Place 2 sprays into both nostrils daily as needed.   lisinopril-hydrochlorothiazide (ZESTORETIC) 10-12.5 MG tablet TAKE 1 TABLET BY MOUTH DAILY   metFORMIN (GLUCOPHAGE) 500 MG tablet TAKE 1 TABLET(500 MG) BY MOUTH THREE TIMES DAILY WITH MEALS   omeprazole (PRILOSEC) 20 MG capsule TAKE 1 CAPSULE(20 MG) BY MOUTH DAILY   penicillin v potassium (VEETID) 500 MG tablet Take 1 tablet (500 mg total) by mouth 3 (three) times daily.   promethazine (PHENERGAN) 25 MG tablet Take 1 tablet (25 mg total) by mouth every 8 (eight) hours as needed for nausea or vomiting.   sertraline (ZOLOFT) 50 MG tablet TAKE 1 TABLET(50 MG) BY MOUTH DAILY   tirzepatide (MOUNJARO) 10 MG/0.5ML Pen Inject 10 mg into the skin once a week.   [DISCONTINUED] oxyCODONE-acetaminophen (PERCOCET) 7.5-325 MG tablet Take 1 tablet by mouth every 6 (six) hours as needed for severe pain (pain score 7-10).   No facility-administered medications prior to visit.  Objective    BP (!) 106/56 (BP Location: Left Wrist, Patient Position: Sitting, Cuff Size: Large)   Pulse (!) 103   Temp 98.8 F (37.1 C) (Oral)   Ht 5\' 6"  (1.676 m)   Wt (!) 343 lb (155.6 kg)   BMI 55.36 kg/m   Physical Exam  General appearance: Severely obese female, cooperative and in no acute distress Head: Normocephalic, without obvious abnormality, atraumatic Respiratory: Respirations even and unlabored, normal respiratory rate Extremities: All extremities are intact.  Skin: Skin color,  texture, turgor normal. No rashes seen  Psych: Appropriate mood and affect. Neurologic: Mental status: Alert, oriented to person, place, and time, thought content appropriate.      No results found for any visits on 12/12/23.  Assessment & Plan       Abnormal Uterine Bleeding and pelvic mass on MRI Patient presented with lower abdominal pain and heavy bleeding, leading to a significant drop in hemoglobin. The patient was evaluated in the ER three days and continues to experience fatigue and decreased appetite, likely secondary to anemia. -Order urgent CBC at [facility] outpatient lab today to assess hemoglobin level. -If hemoglobin has dropped significantly, may need to refer to ER. -Refer to Gynecology for urgent evaluation and management of abnormal uterine bleeding.  Pain Management Patient is currently on Celebrex and has been advised to double up on pain medication due to current symptoms. -Send refill for oxycodone=APAP to [facility] with adjusted directions for increased dose as needed.    No follow-ups on file.      Mila Merry, MD  [facility] [phone] (phone) [phone] (fax)  [facility]

## 2023-12-13 ENCOUNTER — Other Ambulatory Visit: Payer: Self-pay | Admitting: Obstetrics and Gynecology

## 2023-12-13 DIAGNOSIS — D509 Iron deficiency anemia, unspecified: Secondary | ICD-10-CM

## 2023-12-13 DIAGNOSIS — R1900 Intra-abdominal and pelvic swelling, mass and lump, unspecified site: Secondary | ICD-10-CM

## 2023-12-13 DIAGNOSIS — Z01818 Encounter for other preprocedural examination: Secondary | ICD-10-CM

## 2023-12-14 ENCOUNTER — Other Ambulatory Visit (HOSPITAL_COMMUNITY): Payer: Self-pay | Admitting: Obstetrics and Gynecology

## 2023-12-14 ENCOUNTER — Telehealth (HOSPITAL_COMMUNITY): Payer: Self-pay | Admitting: Obstetrics and Gynecology

## 2023-12-14 DIAGNOSIS — D509 Iron deficiency anemia, unspecified: Secondary | ICD-10-CM

## 2023-12-14 NOTE — Telephone Encounter (Signed)
 Spoke with [facility] and they will get the patient scheduled for Venofer 300 mgIV weekly x 3 doses.  Demetrius Charity, PharmD

## 2023-12-16 ENCOUNTER — Ambulatory Visit
Admission: RE | Admit: 2023-12-16 | Discharge: 2023-12-16 | Disposition: A | Discharge status: Home / Self Care | Payer: 59 | Source: Ambulatory Visit | Attending: Obstetrics and Gynecology | Admitting: Obstetrics and Gynecology

## 2023-12-16 DIAGNOSIS — D509 Iron deficiency anemia, unspecified: Secondary | ICD-10-CM | POA: Insufficient documentation

## 2023-12-16 DIAGNOSIS — A419 Sepsis, unspecified organism: Secondary | ICD-10-CM | POA: Diagnosis not present

## 2023-12-16 DIAGNOSIS — R1900 Intra-abdominal and pelvic swelling, mass and lump, unspecified site: Secondary | ICD-10-CM | POA: Insufficient documentation

## 2023-12-16 MED ORDER — IRON SUCROSE 300 MG IVPB - SIMPLE MED
300.0000 mg | Status: DC
  Administered 2023-12-16: 300 mg via INTRAVENOUS
  Filled 2023-12-16: qty 300

## 2023-12-18 ENCOUNTER — Encounter: Payer: Self-pay | Admitting: Radiology

## 2023-12-18 ENCOUNTER — Emergency Department

## 2023-12-18 ENCOUNTER — Other Ambulatory Visit: Payer: Self-pay

## 2023-12-18 ENCOUNTER — Inpatient Hospital Stay
Admission: EM | Admit: 2023-12-18 | Discharge: 2023-12-22 | DRG: 871 | Disposition: A | Discharge status: Home / Self Care | Attending: Internal Medicine | Admitting: Internal Medicine

## 2023-12-18 ENCOUNTER — Inpatient Hospital Stay

## 2023-12-18 DIAGNOSIS — Z79899 Other long term (current) drug therapy: Secondary | ICD-10-CM

## 2023-12-18 DIAGNOSIS — N390 Urinary tract infection, site not specified: Secondary | ICD-10-CM | POA: Diagnosis not present

## 2023-12-18 DIAGNOSIS — F419 Anxiety disorder, unspecified: Secondary | ICD-10-CM | POA: Diagnosis not present

## 2023-12-18 DIAGNOSIS — N809 Endometriosis, unspecified: Secondary | ICD-10-CM | POA: Diagnosis present

## 2023-12-18 DIAGNOSIS — F411 Generalized anxiety disorder: Secondary | ICD-10-CM | POA: Diagnosis present

## 2023-12-18 DIAGNOSIS — N83202 Unspecified ovarian cyst, left side: Secondary | ICD-10-CM | POA: Diagnosis not present

## 2023-12-18 DIAGNOSIS — B9620 Unspecified Escherichia coli [E. coli] as the cause of diseases classified elsewhere: Secondary | ICD-10-CM | POA: Diagnosis present

## 2023-12-18 DIAGNOSIS — A419 Sepsis, unspecified organism: Secondary | ICD-10-CM | POA: Diagnosis present

## 2023-12-18 DIAGNOSIS — D509 Iron deficiency anemia, unspecified: Secondary | ICD-10-CM | POA: Diagnosis present

## 2023-12-18 DIAGNOSIS — D649 Anemia, unspecified: Secondary | ICD-10-CM

## 2023-12-18 DIAGNOSIS — Z806 Family history of leukemia: Secondary | ICD-10-CM

## 2023-12-18 DIAGNOSIS — L299 Pruritus, unspecified: Secondary | ICD-10-CM | POA: Diagnosis not present

## 2023-12-18 DIAGNOSIS — N3020 Other chronic cystitis without hematuria: Secondary | ICD-10-CM | POA: Diagnosis present

## 2023-12-18 DIAGNOSIS — Z6841 Body Mass Index (BMI) 40.0 and over, adult: Secondary | ICD-10-CM | POA: Diagnosis not present

## 2023-12-18 DIAGNOSIS — N9489 Other specified conditions associated with female genital organs and menstrual cycle: Secondary | ICD-10-CM | POA: Diagnosis present

## 2023-12-18 DIAGNOSIS — B957 Other staphylococcus as the cause of diseases classified elsewhere: Secondary | ICD-10-CM | POA: Diagnosis present

## 2023-12-18 DIAGNOSIS — R6521 Severe sepsis with septic shock: Principal | ICD-10-CM | POA: Diagnosis present

## 2023-12-18 DIAGNOSIS — N179 Acute kidney failure, unspecified: Secondary | ICD-10-CM | POA: Diagnosis present

## 2023-12-18 DIAGNOSIS — E1165 Type 2 diabetes mellitus with hyperglycemia: Secondary | ICD-10-CM | POA: Diagnosis present

## 2023-12-18 DIAGNOSIS — D259 Leiomyoma of uterus, unspecified: Secondary | ICD-10-CM | POA: Diagnosis present

## 2023-12-18 DIAGNOSIS — E785 Hyperlipidemia, unspecified: Secondary | ICD-10-CM | POA: Diagnosis present

## 2023-12-18 DIAGNOSIS — R1900 Intra-abdominal and pelvic swelling, mass and lump, unspecified site: Secondary | ICD-10-CM | POA: Diagnosis not present

## 2023-12-18 DIAGNOSIS — I10 Essential (primary) hypertension: Secondary | ICD-10-CM | POA: Diagnosis present

## 2023-12-18 DIAGNOSIS — Z7985 Long-term (current) use of injectable non-insulin antidiabetic drugs: Secondary | ICD-10-CM | POA: Diagnosis not present

## 2023-12-18 DIAGNOSIS — G4733 Obstructive sleep apnea (adult) (pediatric): Secondary | ICD-10-CM | POA: Diagnosis present

## 2023-12-18 DIAGNOSIS — D75839 Thrombocytosis, unspecified: Secondary | ICD-10-CM | POA: Diagnosis present

## 2023-12-18 DIAGNOSIS — E872 Acidosis, unspecified: Secondary | ICD-10-CM | POA: Diagnosis present

## 2023-12-18 DIAGNOSIS — Z1152 Encounter for screening for COVID-19: Secondary | ICD-10-CM | POA: Diagnosis not present

## 2023-12-18 DIAGNOSIS — R011 Cardiac murmur, unspecified: Secondary | ICD-10-CM | POA: Diagnosis not present

## 2023-12-18 DIAGNOSIS — E119 Type 2 diabetes mellitus without complications: Secondary | ICD-10-CM | POA: Diagnosis not present

## 2023-12-18 DIAGNOSIS — F32A Depression, unspecified: Secondary | ICD-10-CM | POA: Diagnosis present

## 2023-12-18 DIAGNOSIS — Z7984 Long term (current) use of oral hypoglycemic drugs: Secondary | ICD-10-CM | POA: Diagnosis not present

## 2023-12-18 DIAGNOSIS — D62 Acute posthemorrhagic anemia: Secondary | ICD-10-CM | POA: Diagnosis not present

## 2023-12-18 DIAGNOSIS — E1169 Type 2 diabetes mellitus with other specified complication: Secondary | ICD-10-CM | POA: Diagnosis present

## 2023-12-18 DIAGNOSIS — Z800 Family history of malignant neoplasm of digestive organs: Secondary | ICD-10-CM

## 2023-12-18 LAB — RESP PANEL BY RT-PCR (RSV, FLU A&B, COVID)  RVPGX2
Influenza A by PCR: NEGATIVE
Influenza B by PCR: NEGATIVE
Resp Syncytial Virus by PCR: NEGATIVE
SARS Coronavirus 2 by RT PCR: NEGATIVE

## 2023-12-18 LAB — URINALYSIS, W/ REFLEX TO CULTURE (INFECTION SUSPECTED)
Bilirubin Urine: NEGATIVE
Glucose, UA: NEGATIVE mg/dL
Ketones, ur: NEGATIVE mg/dL
Nitrite: NEGATIVE
Protein, ur: 100 mg/dL — AB
RBC / HPF: >50 RBC/hpf (ref 0–5)
Specific Gravity, Urine: 1.014 (ref 1.005–1.030)
WBC, UA: >50 WBC/hpf (ref 0–5)
pH: 5 (ref 5.0–8.0)

## 2023-12-18 LAB — CBC WITH DIFFERENTIAL/PLATELET
Abs Immature Granulocytes: 0.71 10*3/uL — ABNORMAL HIGH (ref 0.00–0.07)
Basophils Absolute: 0.1 10*3/uL (ref 0.0–0.1)
Basophils Relative: 0 %
Eosinophils Absolute: 0 10*3/uL (ref 0.0–0.5)
Eosinophils Relative: 0 %
HCT: 25.6 % — ABNORMAL LOW (ref 36.0–46.0)
Hemoglobin: 7.8 g/dL — ABNORMAL LOW (ref 12.0–15.0)
Immature Granulocytes: 2 %
Lymphocytes Relative: 3 %
Lymphs Abs: 1.2 10*3/uL (ref 0.7–4.0)
MCH: 23.6 pg — ABNORMAL LOW (ref 26.0–34.0)
MCHC: 30.5 g/dL (ref 30.0–36.0)
MCV: 77.6 fL — ABNORMAL LOW (ref 80.0–100.0)
Monocytes Absolute: 2.4 10*3/uL — ABNORMAL HIGH (ref 0.1–1.0)
Monocytes Relative: 7 %
Neutro Abs: 29.9 10*3/uL — ABNORMAL HIGH (ref 1.7–7.7)
Neutrophils Relative %: 88 %
Platelets: 599 10*3/uL — ABNORMAL HIGH (ref 150–400)
RBC: 3.3 MIL/uL — ABNORMAL LOW (ref 3.87–5.11)
RDW: 15.9 % — ABNORMAL HIGH (ref 11.5–15.5)
Smear Review: NORMAL
WBC: 34.3 10*3/uL — ABNORMAL HIGH (ref 4.0–10.5)
nRBC: 0 % (ref 0.0–0.2)

## 2023-12-18 LAB — COMPREHENSIVE METABOLIC PANEL
ALT: 11 U/L (ref 0–44)
AST: 19 U/L (ref 15–41)
Albumin: 2.3 g/dL — ABNORMAL LOW (ref 3.5–5.0)
Alkaline Phosphatase: 90 U/L (ref 38–126)
Anion gap: 13 (ref 5–15)
BUN: 38 mg/dL — ABNORMAL HIGH (ref 6–20)
CO2: 21 mmol/L — ABNORMAL LOW (ref 22–32)
Calcium: 8.4 mg/dL — ABNORMAL LOW (ref 8.9–10.3)
Chloride: 99 mmol/L (ref 98–111)
Creatinine, Ser: 2.11 mg/dL — ABNORMAL HIGH (ref 0.44–1.00)
GFR, Estimated: 29 mL/min — ABNORMAL LOW (ref >60)
Glucose, Bld: 165 mg/dL — ABNORMAL HIGH (ref 70–99)
Potassium: 4.1 mmol/L (ref 3.5–5.1)
Sodium: 133 mmol/L — ABNORMAL LOW (ref 135–145)
Total Bilirubin: 0.5 mg/dL (ref 0.0–1.2)
Total Protein: 7.8 g/dL (ref 6.5–8.1)

## 2023-12-18 LAB — PROCALCITONIN: Procalcitonin: 2.12 ng/mL

## 2023-12-18 LAB — LACTIC ACID, PLASMA
Lactic Acid, Venous: 2.6 mmol/L (ref 0.5–1.9)
Lactic Acid, Venous: 1.7 mmol/L (ref 0.5–1.9)

## 2023-12-18 LAB — PREGNANCY, URINE: Preg Test, Ur: NEGATIVE

## 2023-12-18 LAB — PROTIME-INR
INR: 1.4 — ABNORMAL HIGH (ref 0.8–1.2)
Prothrombin Time: 17.1 s — ABNORMAL HIGH (ref 11.4–15.2)

## 2023-12-18 MED ORDER — INSULIN ASPART 100 UNIT/ML IJ SOLN: 0.0000 [IU] | Freq: Every day | INTRAMUSCULAR | Status: DC

## 2023-12-18 MED ORDER — SODIUM CHLORIDE 0.9 % IV BOLUS (SEPSIS)
1000.0000 mL | Freq: Once | INTRAVENOUS | Status: AC
  Administered 2023-12-18: 1000 mL via INTRAVENOUS

## 2023-12-18 MED ORDER — SODIUM CHLORIDE 0.9 % IV SOLN
2.0000 g | Freq: Once | INTRAVENOUS | Status: AC
  Administered 2023-12-18: 2 g via INTRAVENOUS
  Filled 2023-12-18: qty 12.5

## 2023-12-18 MED ORDER — METRONIDAZOLE 500 MG/100ML IV SOLN
500.0000 mg | Freq: Once | INTRAVENOUS | Status: AC
  Administered 2023-12-18: 500 mg via INTRAVENOUS
  Filled 2023-12-18: qty 100

## 2023-12-18 MED ORDER — INSULIN ASPART 100 UNIT/ML IJ SOLN
0.0000 [IU] | Freq: Three times a day (TID) | INTRAMUSCULAR | Status: DC
  Administered 2023-12-19 – 2023-12-21 (×4): 3 [IU] via SUBCUTANEOUS
  Filled 2023-12-18 (×4): qty 1

## 2023-12-18 MED ORDER — SODIUM CHLORIDE 0.9 % IV SOLN: Freq: Once | INTRAVENOUS | Status: AC

## 2023-12-18 MED ORDER — NOREPINEPHRINE 4 MG/250ML-% IV SOLN
0.0000 ug/min | INTRAVENOUS | Status: DC
  Administered 2023-12-18: 2 ug/min via INTRAVENOUS
  Administered 2023-12-19: 1 ug/min via INTRAVENOUS
  Filled 2023-12-18 (×2): qty 250

## 2023-12-18 MED ORDER — CHLORHEXIDINE GLUCONATE CLOTH 2 % EX PADS
6.0000 | MEDICATED_PAD | Freq: Every day | CUTANEOUS | Status: DC
  Administered 2023-12-20 – 2023-12-22 (×2): 6 via TOPICAL

## 2023-12-18 MED ORDER — VANCOMYCIN HCL IN DEXTROSE 1-5 GM/200ML-% IV SOLN
1000.0000 mg | Freq: Once | INTRAVENOUS | Status: AC
  Administered 2023-12-18: 1000 mg via INTRAVENOUS
  Filled 2023-12-18: qty 200

## 2023-12-18 MED ORDER — DOCUSATE SODIUM 100 MG PO CAPS: 100.0000 mg | ORAL_CAPSULE | Freq: Two times a day (BID) | ORAL | Status: DC | PRN

## 2023-12-18 MED ORDER — POLYETHYLENE GLYCOL 3350 17 G PO PACK: 17.0000 g | PACK | Freq: Every day | ORAL | Status: DC | PRN

## 2023-12-18 NOTE — H&P (Signed)
 NAME:  Jessica Fields, MRN:  [id number], DOB:  [birth year], LOS: 0 ADMISSION DATE:  12/18/2023, CONSULTATION DATE:  12/18/2023 REFERRING MD:  Jene Every  CHIEF COMPLAINT:  Fevers and chills   HPI  43 y.o female with significant PMH of HTN, T2DM, HLD, Obesity, OSA, chronic cystitis, IDA, anxiety and depression, endometriosis who presented to the ED with chief complaints of abdominal pain, fevers, chills, fatigue with associated urinary symptoms since last night.  Patient also endorses mild shortness of breath which she is attributing to her IDA, received iron infusion 2 days ago.   ED Course: Initial vital signs showed HR of 140 beats/minute, BP 88/78mm Hg, the RR 33 breaths/minute, and the oxygen saturation 92% on RA and a temperature of 102.72F (39.4C). Pertinent Labs/Diagnostics Findings: Na+/ K+:133/4.1 Glucose: 165 BUN/Cr.:38/2.11  WBC: 34.3K/L with bands or neutrophil predominance Hgb/Hct: 7.8/25.6 Plts:599  PCT: 2.12 Lactic acid: 2.6 COVID PCR: Negative,  CXR> negative Patient given 30 cc/kg of fluids and started on broad-spectrum antibiotics Vanco cefepime and Flagyl for sepsis due to suspected UTI.  Patient remained hypotensive despite IVF boluses therefore was started on Levophed.   PCCM consulted.  Past Medical History  T2DM HTN HLD OSA Chronic cholecystitis IDA Anxiety and depression Morbid obesity Endometriosis Significant Hospital Events   3/2: Admitted to ICU with sepsis secondary to UTI  Consults:  none  Procedures:  none  Significant Diagnostic Tests:  0/2/25: Chest Xray> no active cardiopulmonary disease  Interim History / Subjective:      Micro Data:  3/2: SARS-CoV-2 PCR> negative 3/2: Influenza PCR> negative 3/2: Blood culture x2> 3/2: Urine Culture> 8/2: MRSA PCR>>   Antimicrobials:  Vancomycin 3/2 x 1 Cefepime 3/2 x 1 Metronidazole 3/2 x 1 Ceftriaxone 3/3>>  OBJECTIVE  Blood pressure (!) 100/58, pulse 96, temperature 99 F (37.2 C),  temperature source Oral, resp. rate (!) 30, SpO2 94%.      Intake/Output Summary (Last 24 hours) at 12/18/2023 2216 Last data filed at 12/18/2023 2106 Gross per 24 hour  Intake 3200 ml  Output --  Net 3200 ml   There were no vitals filed for this visit.  Physical Examination  GENERAL: 43 year-old critically ill patient lying in the bed in no acute distress EYES: PEERLA. No scleral icterus. Extraocular muscles intact.  HEENT: Head atraumatic, normocephalic. Oropharynx and nasopharynx clear.  NECK:  No JVD, supple  LUNGS: Normal breath sounds bilaterally.  No use of accessory muscles of respiration.  CARDIOVASCULAR: S1, S2 normal. No murmurs, rubs, or gallops.  ABDOMEN: Soft, NTND EXTREMITIES: No swelling or erythema.  Capillary refill < 3 seconds in all extremities. Pulses palpable distally. NEUROLOGIC: The patient is A&O x 4 . No neurological deficit appreciated. Cranial nerves are intact.  SKIN: No obvious rash, lesion, or ulcer. Warm to touch Labs/imaging that I havepersonally reviewed  (right click and "Reselect all SmartList Selections" daily)     Labs   CBC: Recent Labs  Lab 12/12/23 1457 12/18/23 1806  WBC 16.9* 34.3*  NEUTROABS  --  29.9*  HGB 8.4* 7.8*  HCT 27.3* 25.6*  MCV 77.1* 77.6*  PLT 611* 599*   Basic Metabolic Panel: Recent Labs  Lab 12/18/23 1842  NA 133*  K 4.1  CL 99  CO2 21*  GLUCOSE 165*  BUN 38*  CREATININE 2.11*  CALCIUM 8.4*   GFR: Estimated Creatinine Clearance: 52 mL/min (A) (by C-G formula based on SCr of 2.11 mg/dL (H)). Recent Labs  Lab 12/12/23 1457 12/18/23 1806  WBC 16.9* 34.3*  LATICACIDVEN  --  2.6*   Liver Function Tests: Recent Labs  Lab 12/18/23 1842  AST 19  ALT 11  ALKPHOS 90  BILITOT 0.5  PROT 7.8  ALBUMIN 2.3*   No results for input(s): "LIPASE", "AMYLASE" in the last 168 hours. No results for input(s): "AMMONIA" in the last 168 hours.  ABG No results found for: "PHART", "PCO2ART", "PO2ART", "HCO3",  "TCO2", "ACIDBASEDEF", "O2SAT"   Coagulation Profile: Recent Labs  Lab 12/18/23 1806  INR 1.4*   Cardiac Enzymes: No results for input(s): "CKTOTAL", "CKMB", "CKMBINDEX", "TROPONINI" in the last 168 hours.  HbA1C: Hemoglobin A1C  Date/Time Value Ref Range Status  06/24/2023 11:01 AM 6.3 (A) 4.0 - 5.6 % Final  02/07/2023 04:32 PM 6.1 (A) 4.0 - 5.6 % Final   Hgb A1c MFr Bld  Date/Time Value Ref Range Status  03/16/2022 01:48 PM 9.0 (H) 4.8 - 5.6 % Final    Comment:             Prediabetes: 5.7 - 6.4          Diabetes: >6.4          Glycemic control for adults with diabetes: <7.0   12/19/2020 02:24 PM 6.3 (H) 4.8 - 5.6 % Final    Comment:             Prediabetes: 5.7 - 6.4          Diabetes: >6.4          Glycemic control for adults with diabetes: <7.0    CBG: No results for input(s): "GLUCAP" in the last 168 hours.  Review of Systems:   Review of Systems  Constitutional:  Positive for chills, fever and malaise/fatigue.  HENT: Negative.    Eyes: Negative.   Respiratory:  Positive for shortness of breath.   Cardiovascular: Negative.   Gastrointestinal:  Positive for abdominal pain.  Genitourinary:  Positive for frequency and urgency.  Musculoskeletal:  Positive for back pain and myalgias.  Skin: Negative.   Neurological: Negative.   Endo/Heme/Allergies: Negative.   Psychiatric/Behavioral:  Positive for depression.    Past Medical History  She,  has a past medical history of History of chicken pox and History of cholecystitis.   Surgical History    Past Surgical History:  Procedure Laterality Date   Sleep Study  01/25/2005   Performed at [facility] by Dr. Ysidro Evert. Interpretation: very severe Sleep Apnea with nocturnal desaturations. She was improved with nasal Bi-PAP, although was not titrated to optimal pressure. I suspect she has nasal sinus obstruction or congestion     Social History   reports that she has never smoked. She has never used smokeless  tobacco. She reports current alcohol use. She reports that she does not use drugs.   Family History   Her family history is negative for Breast cancer and Colon cancer.   Allergies No Known Allergies   Home Medications  Prior to Admission medications   Medication Sig Start Date End Date Taking? Authorizing Provider  amLODipine (NORVASC) 10 MG tablet TAKE 1 TABLET(10 MG) BY MOUTH DAILY 07/04/23  Yes Malva Limes, MD  celecoxib (CELEBREX) 200 MG capsule TAKE 1 CAPSULE BY MOUTH TWICE DAILY 07/06/23  Yes Malva Limes, MD  diclofenac sodium (VOLTAREN) 1 % GEL Apply 4 g topically 4 (four) times daily as needed. 07/25/18  Yes Malva Limes, MD  fluticasone (FLONASE) 50 MCG/ACT nasal spray Place 2 sprays into both nostrils daily as  needed. 10/13/14  Yes [provider]  lisinopril-hydrochlorothiazide (ZESTORETIC) 10-12.5 MG tablet TAKE 1 TABLET BY MOUTH DAILY 10/25/23  Yes Malva Limes, MD  Melatonin 10 MG TABS Take 10 mg by mouth at bedtime as needed.   Yes [provider]  metFORMIN (GLUCOPHAGE) 500 MG tablet TAKE 1 TABLET(500 MG) BY MOUTH THREE TIMES DAILY WITH MEALS 02/18/23  Yes Malva Limes, MD  omeprazole (PRILOSEC) 20 MG capsule TAKE 1 CAPSULE(20 MG) BY MOUTH DAILY 06/06/23  Yes Malva Limes, MD  oxyCODONE-acetaminophen (PERCOCET) 7.5-325 MG tablet Take 2 tablets by mouth every 6 (six) hours as needed for severe pain (pain score 7-10). 12/12/23  Yes Malva Limes, MD  sertraline (ZOLOFT) 50 MG tablet TAKE 1 TABLET(50 MG) BY MOUTH DAILY 07/04/23  Yes Malva Limes, MD  tirzepatide [facility]) 10 MG/0.5ML Pen Inject 10 mg into the skin once a week. 06/24/23  Yes Malva Limes, MD  fluconazole (DIFLUCAN) 100 MG tablet Take 1 tablet (100 mg total) by mouth daily. Take after completing penicillin Patient not taking: Reported on 12/18/2023 09/23/23   Sherlyn Hay, DO  penicillin v potassium (VEETID) 500 MG tablet Take 1 tablet (500 mg total) by mouth 3 (three)  times daily. Patient not taking: Reported on 12/18/2023 09/23/23   Sherlyn Hay, DO  promethazine (PHENERGAN) 25 MG tablet Take 1 tablet (25 mg total) by mouth every 8 (eight) hours as needed for nausea or vomiting. Patient not taking: Reported on 12/18/2023 07/06/17   Malva Limes, MD  Scheduled Meds: Continuous Infusions:  norepinephrine (LEVOPHED) Adult infusion 2 mcg/min (12/18/23 2126)   PRN Meds:.docusate sodium, polyethylene glycol  Active Hospital Problem list   See systems below  Assessment & Plan:  Sepsis with Septic shock due to suspected UTI Initial interventions/workup included: 30cc/kg L of NS/LR & Cefepime/ Vancomycin/ Metronidazole meets SIRS criteria: Heart Rate 140 beats/minute, Respiratory Rate 33 breaths/minute,Temperature 102.9, Shock Index (SI) >1.8 -F/u cultures, trend lactic/ PCT -Monitor WBC/ fever curve -IV antibiotics: start Ceftriaxone -IVF hydration as needed -Pressors for MAP goal >65 -Strict I/O's  #AKI #NAGMA with Lactic Acidosis -Monitor I&O's / urinary output -Follow BMP -Ensure adequate renal perfusion -Avoid nephrotoxic agents as able -Replace electrolytes as indicated  #IDA Hgb 7.8 on admission. Last MRI abd ?hemorrhagic cyst -Follow MRI -Monitor for S/Sx of bleeding -Trend CBC (H&H q6h) -SCD's for VTE Prophylaxis (chemical ppx contraindicated) -Transfuse for Hgb <7  (has received iron infusion on 5/23)  #Hypertension #HLD  -Continuous cardiac monitoring -Maintain MAP greater than 65 -Hold Norvasc, lisinopril/hydrochlorothiazide  #T2 Diabetes mellitus Check hemoglobin A1c -CBGs -Sliding scale insulin -Follow ICU hyper/hypoglycemia protocol -Hold home Metformin & Mounjaro   #OSA on CPAP -Supplemental oxygen as needed, to maintain SpO2 > 90% -CPAP at night  #Anxiety and Depression -Continue home sertraline  #hx of Endometriosis and pelvic mass Persistent abdominal pain MRI abdomen pelvis pending  Best practice:  Diet:   Oral Pain/Anxiety/Delirium protocol (if indicated): No VAP protocol (if indicated): Not indicated DVT prophylaxis: Contraindicated GI prophylaxis: N/A Glucose control:  SSI Yes Central venous access:  N/A Arterial line:  N/A Foley:  N/A Mobility:  bed rest  PT consulted: N/A Last date of multidisciplinary goals of care discussion [3/2] Code Status:  full code Disposition: ICU   = Goals of Care = Code Status Order: FULL  Primary Emergency Contact: Carles,Justin, Home Phone: [phone] Wishes to pursue full aggressive treatment and intervention options, including CPR and intubation, but goals of  care will be addressed on going if that should become necessary.  Critical care time: 45 minutes      Webb Silversmith DNP, CCRN, FNP-C, AGACNP-BC Acute Care & Family Nurse Practitioner [facility] PCCM on call pager [phone]

## 2023-12-18 NOTE — Progress Notes (Signed)
 CODE SEPSIS - PHARMACY COMMUNICATION  **Broad Spectrum Antibiotics should be administered within 1 hour of Sepsis diagnosis**  Time Code Sepsis Called/Page Received: 1835  Antibiotics Ordered: cefepime, vancomycin, and metronidazole  Time of 1st antibiotic administration: 1855  Additional action taken by pharmacy: None  If necessary, Name of Provider/Nurse Contacted: None    Rockwell Alexandria ,PharmD Clinical Pharmacist  12/18/2023  6:50 PM

## 2023-12-18 NOTE — Progress Notes (Addendum)
 eLink Physician-Brief Progress Note Patient Name: Jessica Fields DOB: January 27, 1981 MRN: [id number]   Date of Service  12/18/2023  HPI/Events of Note  43 y.o. female with a history of diabetes, hypertension, OSA, obese, chronic cholecytits, who presents with fever, chills, fatigue which started last night.  She reports urinary frequency, mild shortness of breath which she is attributing to her anemia, recently received iron infusion 2 days ago , now in ICU for  Urosepsis on low dose levo. S/p fluids, on antibiotics. Bed side ordered MRI abdomen for abdominal mass evaluation.   Camera: Morbidly obese, on room air. VS stable.   Data: Reviewed LA  normalized from 2.6 to 1.7 EKG sinus tachy. Cr 2.11, ALP normal, ast/alt normal Wbc 33K, abnormal UA.  Anemic Hg at 7.8, MCV/MCH low. Plt 599 RSV neg CxR neg for chf or PNA     eICU Interventions  Continue current care plan Urine pregnancy testing is pending still.  Follow urine output. SCD as VTE prophylaxis.      Intervention Category Major Interventions: Hypotension - evaluation and management;Sepsis - evaluation and management Evaluation Type: New Patient Evaluation  Ranee Gosselin 12/18/2023, 10:58 PM

## 2023-12-18 NOTE — Sepsis Progress Note (Signed)
 Elink following for sepsis protocol.

## 2023-12-18 NOTE — Sepsis Progress Note (Signed)
 Sepsis protocol monitored by eLink ?

## 2023-12-18 NOTE — ED Triage Notes (Signed)
 Pt reports fever and chills that started around midnight. Pt reports she has taken tylenol for fever at home PTA. Pt also reports feeling generalized weakness. Denies pain.

## 2023-12-18 NOTE — ED Provider Notes (Signed)
 [facility] Provider Note    Event Date/Time   First MD Initiated Contact with Patient 12/18/23 1821     (approximate)   History   Fever   HPI  Jessica Fields is a 43 y.o. female with a history of diabetes, hypertension who presents with fever, chills, fatigue which started last night.  She reports urinary frequency, mild shortness of breath which she is attributing to her anemia, recently received iron infusion 2 days ago     Physical Exam   Triage Vital Signs: ED Triage Vitals  Encounter Vitals Group     BP 12/18/23 1802 (!) 88/51     Systolic BP Percentile --      Diastolic BP Percentile --      Pulse Rate 12/18/23 1802 (!) 140     Resp 12/18/23 1802 19     Temp 12/18/23 1802 (!) 102.9 F (39.4 C)     Temp Source 12/18/23 1802 Oral     SpO2 12/18/23 1802 92 %     Weight --      Height --      Head Circumference --      Peak Flow --      Pain Score 12/18/23 1800 0     Pain Loc --      Pain Education --      Exclude from Growth Chart --     Most recent vital signs: Vitals:   12/18/23 2045 12/18/23 2100  BP: (!) 83/49 (!) 80/45  Pulse: 97 96  Resp: (!) 26 (!) 30  Temp:    SpO2: 91% 91%     General: Awake, no distress.  CV:  Good peripheral perfusion.  Resp:  Mild tachypnea, clear to auscultation Abd:  No distention.  Soft, nontender, no CVA tenderness Other:     ED Results / Procedures / Treatments   Labs (all labs ordered are listed, but only abnormal results are displayed) Labs Reviewed  LACTIC ACID, PLASMA - Abnormal; Notable for the following components:      Result Value   Lactic Acid, Venous 2.6 (*)    All other components within normal limits  CBC WITH DIFFERENTIAL/PLATELET - Abnormal; Notable for the following components:   WBC 34.3 (*)    RBC 3.30 (*)    Hemoglobin 7.8 (*)    HCT 25.6 (*)    MCV 77.6 (*)    MCH 23.6 (*)    RDW 15.9 (*)    Platelets 599 (*)    Neutro Abs 29.9 (*)    Monocytes Absolute 2.4  (*)    Abs Immature Granulocytes 0.71 (*)    All other components within normal limits  PROTIME-INR - Abnormal; Notable for the following components:   Prothrombin Time 17.1 (*)    INR 1.4 (*)    All other components within normal limits  URINALYSIS, W/ REFLEX TO CULTURE (INFECTION SUSPECTED) - Abnormal; Notable for the following components:   Color, Urine YELLOW (*)    APPearance TURBID (*)    Hgb urine dipstick LARGE (*)    Protein, ur 100 (*)    Leukocytes,Ua MODERATE (*)    Bacteria, UA MANY (*)    All other components within normal limits  COMPREHENSIVE METABOLIC PANEL - Abnormal; Notable for the following components:   Sodium 133 (*)    CO2 21 (*)    Glucose, Bld 165 (*)    BUN 38 (*)    Creatinine, Ser 2.11 (*)  Calcium 8.4 (*)    Albumin 2.3 (*)    GFR, Estimated 29 (*)    All other components within normal limits  RESP PANEL BY RT-PCR (RSV, FLU A&B, COVID)  [id number]  CULTURE, BLOOD (ROUTINE X 2)  CULTURE, BLOOD (ROUTINE X 2)  LACTIC ACID, PLASMA  POC URINE PREG, ED     EKG  ED ECG REPORT I, Jene Every, the attending physician, personally viewed and interpreted this ECG.  Date: 12/18/2023  Rhythm: Sinus tachycardia QRS Axis: normal Intervals: normal ST/T Wave abnormalities: normal Narrative Interpretation: no evidence of acute ischemia    RADIOLOGY Chest x-ray viewed interpret by me, no pneumonia    PROCEDURES:  Critical Care performed: yes  CRITICAL CARE Performed by: Jene Every   Total critical care time: 30 minutes  Critical care time was exclusive of separately billable procedures and treating other patients.  Critical care was necessary to treat or prevent imminent or life-threatening deterioration.  Critical care was time spent personally by me on the following activities: development of treatment plan with patient and/or surrogate as well as nursing, discussions with consultants, evaluation of patient's response to treatment,  examination of patient, obtaining history from patient or surrogate, ordering and performing treatments and interventions, ordering and review of laboratory studies, ordering and review of radiographic studies, pulse oximetry and re-evaluation of patient's condition.   Procedures   MEDICATIONS ORDERED IN ED: Medications  vancomycin (VANCOCIN) IVPB 1000 mg/200 mL premix (1,000 mg Intravenous New Bag/Given 12/18/23 2108)  norepinephrine (LEVOPHED) 4mg  in (0.016 mg/mL) premix infusion (has no administration in time range)  0.9 %  sodium chloride infusion (0 mLs Intravenous Stopped 12/18/23 1900)  sodium chloride 0.9 % bolus 1,000 mL (0 mLs Intravenous Stopped 12/18/23 1930)    And  sodium chloride 0.9 % bolus 1,000 mL (0 mLs Intravenous Stopped 12/18/23 2106)  ceFEPIme (MAXIPIME) 2 g in sodium chloride 0.9 % 100 mL IVPB (0 g Intravenous Stopped 12/18/23 1900)  metroNIDAZOLE (FLAGYL) IVPB 500 mg (0 mg Intravenous Stopped 12/18/23 2106)     IMPRESSION / MDM / ASSESSMENT AND PLAN / ED COURSE  I reviewed the triage vital signs and the nursing notes. Patient's presentation is most consistent with acute presentation with potential threat to life or bodily function.  Patient presents with hypotension, tachycardia, fever, oxygen saturation 92%.  Highly suspicious for sepsis/septic shock.  Differential includes pneumonia, UTI, no flank pain to suggest Pilo or kidney stone.  Review of records demonstrates patient recently had MRI that demonstrated uterine mass however she has no abdominal pain to suggest this as a cause.  White blood cell count is significantly elevated, broad-spectrum antibiotics ordered, IV fluids ordered, code sepsis activated  Will use ideal body weight for fluid calculations, patient's actual weight is 154 kg, her ideal body weight is equivalent to 59 kg, therefore we will give 2000 mL of fluid, 30 mL/kg  Lactic acid is elevated, cefepime is infusing   ED Sepsis - Repeat Assessment    Performed at:    9 PM  Last Vitals:    Blood pressure (!) 80/45, pulse 96, temperature 99.7 F (37.6 C), temperature source Oral, resp. rate (!) 30, SpO2 91%.  Heart:      Regular rate and rhythm  Lungs:     Clear to auscultation bilaterally  Capillary Refill:   Good cap refill  Peripheral Pulse (include location): Normal radial pulse right arm   Skin (include color):   Pink  Patient remains hypotensive after  fluids, will start Levophed, will contact the ICU for admission      FINAL CLINICAL IMPRESSION(S) / ED DIAGNOSES   Final diagnoses:  Septic shock (HCC)     Rx / DC Orders   ED Discharge Orders     None        Note:  This document was prepared using Dragon voice recognition software and may include unintentional dictation errors.   Jene Every, MD 12/18/23 2118

## 2023-12-19 ENCOUNTER — Inpatient Hospital Stay

## 2023-12-19 ENCOUNTER — Encounter: Payer: Self-pay | Admitting: Anesthesiology

## 2023-12-19 ENCOUNTER — Encounter: Payer: Self-pay | Admitting: Obstetrics and Gynecology

## 2023-12-19 ENCOUNTER — Encounter: Payer: Self-pay | Admitting: Student in an Organized Health Care Education/Training Program

## 2023-12-19 DIAGNOSIS — I10 Essential (primary) hypertension: Secondary | ICD-10-CM

## 2023-12-19 DIAGNOSIS — A419 Sepsis, unspecified organism: Secondary | ICD-10-CM | POA: Diagnosis not present

## 2023-12-19 DIAGNOSIS — N179 Acute kidney failure, unspecified: Secondary | ICD-10-CM | POA: Diagnosis not present

## 2023-12-19 DIAGNOSIS — D62 Acute posthemorrhagic anemia: Secondary | ICD-10-CM

## 2023-12-19 DIAGNOSIS — R6521 Severe sepsis with septic shock: Secondary | ICD-10-CM

## 2023-12-19 DIAGNOSIS — F32A Depression, unspecified: Secondary | ICD-10-CM

## 2023-12-19 LAB — BASIC METABOLIC PANEL
Anion gap: 10 (ref 5–15)
BUN: 33 mg/dL — ABNORMAL HIGH (ref 6–20)
CO2: 20 mmol/L — ABNORMAL LOW (ref 22–32)
Calcium: 7.6 mg/dL — ABNORMAL LOW (ref 8.9–10.3)
Chloride: 102 mmol/L (ref 98–111)
Creatinine, Ser: 1.7 mg/dL — ABNORMAL HIGH (ref 0.44–1.00)
GFR, Estimated: 38 mL/min — ABNORMAL LOW (ref >60)
Glucose, Bld: 130 mg/dL — ABNORMAL HIGH (ref 70–99)
Potassium: 3.9 mmol/L (ref 3.5–5.1)
Sodium: 132 mmol/L — ABNORMAL LOW (ref 135–145)

## 2023-12-19 LAB — GLUCOSE, CAPILLARY
Glucose-Capillary: 138 mg/dL — ABNORMAL HIGH (ref 70–99)
Glucose-Capillary: 121 mg/dL — ABNORMAL HIGH (ref 70–99)
Glucose-Capillary: 140 mg/dL — ABNORMAL HIGH (ref 70–99)
Glucose-Capillary: 109 mg/dL — ABNORMAL HIGH (ref 70–99)
Glucose-Capillary: 117 mg/dL — ABNORMAL HIGH (ref 70–99)

## 2023-12-19 LAB — BLOOD CULTURE ID PANEL (REFLEXED) - BCID2

## 2023-12-19 LAB — PREPARE RBC (CROSSMATCH)

## 2023-12-19 LAB — CBC WITH DIFFERENTIAL/PLATELET
Abs Immature Granulocytes: 0.23 10*3/uL — ABNORMAL HIGH (ref 0.00–0.07)
Basophils Absolute: 0.1 10*3/uL (ref 0.0–0.1)
Basophils Relative: 0 %
Eosinophils Absolute: 0.1 10*3/uL (ref 0.0–0.5)
Eosinophils Relative: 0 %
HCT: 25.3 % — ABNORMAL LOW (ref 36.0–46.0)
Hemoglobin: 8 g/dL — ABNORMAL LOW (ref 12.0–15.0)
Immature Granulocytes: 1 %
Lymphocytes Relative: 6 %
Lymphs Abs: 1.5 10*3/uL (ref 0.7–4.0)
MCH: 24.2 pg — ABNORMAL LOW (ref 26.0–34.0)
MCHC: 31.6 g/dL (ref 30.0–36.0)
MCV: 76.4 fL — ABNORMAL LOW (ref 80.0–100.0)
Monocytes Absolute: 1.4 10*3/uL — ABNORMAL HIGH (ref 0.1–1.0)
Monocytes Relative: 6 %
Neutro Abs: 21.8 10*3/uL — ABNORMAL HIGH (ref 1.7–7.7)
Neutrophils Relative %: 87 %
Platelets: 491 10*3/uL — ABNORMAL HIGH (ref 150–400)
RBC: 3.31 MIL/uL — ABNORMAL LOW (ref 3.87–5.11)
RDW: 15.9 % — ABNORMAL HIGH (ref 11.5–15.5)
Smear Review: NORMAL
WBC: 25.1 10*3/uL — ABNORMAL HIGH (ref 4.0–10.5)
nRBC: 0 % (ref 0.0–0.2)

## 2023-12-19 LAB — CBC
HCT: 22.2 % — ABNORMAL LOW (ref 36.0–46.0)
Hemoglobin: 6.9 g/dL — ABNORMAL LOW (ref 12.0–15.0)
MCH: 23.6 pg — ABNORMAL LOW (ref 26.0–34.0)
MCHC: 31.1 g/dL (ref 30.0–36.0)
MCV: 76 fL — ABNORMAL LOW (ref 80.0–100.0)
Platelets: 508 10*3/uL — ABNORMAL HIGH (ref 150–400)
RBC: 2.92 MIL/uL — ABNORMAL LOW (ref 3.87–5.11)
RDW: 15.9 % — ABNORMAL HIGH (ref 11.5–15.5)
WBC: 26.4 10*3/uL — ABNORMAL HIGH (ref 4.0–10.5)
nRBC: 0 % (ref 0.0–0.2)

## 2023-12-19 LAB — HEMOGLOBIN A1C
Hgb A1c MFr Bld: 6.3 % — ABNORMAL HIGH (ref 4.8–5.6)
Mean Plasma Glucose: 134.11 mg/dL

## 2023-12-19 LAB — HEMOGLOBIN AND HEMATOCRIT, BLOOD
HCT: 21.8 % — ABNORMAL LOW (ref 36.0–46.0)
Hemoglobin: 7 g/dL — ABNORMAL LOW (ref 12.0–15.0)

## 2023-12-19 LAB — LACTIC ACID, PLASMA: Lactic Acid, Venous: 0.7 mmol/L (ref 0.5–1.9)

## 2023-12-19 LAB — MRSA NEXT GEN BY PCR, NASAL: MRSA by PCR Next Gen: NOT DETECTED

## 2023-12-19 LAB — HIV ANTIBODY (ROUTINE TESTING W REFLEX): HIV Screen 4th Generation wRfx: NONREACTIVE

## 2023-12-19 LAB — D-DIMER, QUANTITATIVE: D-Dimer, Quant: 3.48 ug{FEU}/mL — ABNORMAL HIGH (ref 0.00–0.50)

## 2023-12-19 MED ORDER — PANTOPRAZOLE SODIUM 40 MG PO TBEC
40.0000 mg | DELAYED_RELEASE_TABLET | Freq: Every day | ORAL | Status: DC
  Administered 2023-12-19 – 2023-12-22 (×4): 40 mg via ORAL
  Filled 2023-12-19 (×4): qty 1

## 2023-12-19 MED ORDER — PIPERACILLIN-TAZOBACTAM 3.375 G IVPB
3.3750 g | Freq: Three times a day (TID) | INTRAVENOUS | Status: DC
  Administered 2023-12-19 – 2023-12-21 (×6): 3.375 g via INTRAVENOUS
  Filled 2023-12-19 (×7): qty 50

## 2023-12-19 MED ORDER — MELATONIN 5 MG PO TABS
10.0000 mg | ORAL_TABLET | Freq: Every evening | ORAL | Status: DC | PRN
  Administered 2023-12-19 (×2): 10 mg via ORAL
  Filled 2023-12-19 (×2): qty 2

## 2023-12-19 MED ORDER — GADOBUTROL 1 MMOL/ML IV SOLN
10.0000 mL | Freq: Once | INTRAVENOUS | Status: AC | PRN
  Administered 2023-12-19: 10 mL via INTRAVENOUS

## 2023-12-19 MED ORDER — SERTRALINE HCL 50 MG PO TABS
50.0000 mg | ORAL_TABLET | Freq: Every day | ORAL | Status: DC
  Administered 2023-12-19 – 2023-12-22 (×4): 50 mg via ORAL
  Filled 2023-12-19 (×4): qty 1

## 2023-12-19 MED ORDER — LORAZEPAM 2 MG/ML IJ SOLN: 2.0000 mg | Freq: Once | INTRAMUSCULAR | Status: AC

## 2023-12-19 MED ORDER — SODIUM CHLORIDE 0.9 % IV SOLN: Freq: Once | INTRAVENOUS | Status: AC

## 2023-12-19 MED ORDER — OXYCODONE-ACETAMINOPHEN 7.5-325 MG PO TABS
2.0000 | ORAL_TABLET | Freq: Four times a day (QID) | ORAL | Status: DC | PRN
  Administered 2023-12-19 – 2023-12-22 (×13): 2 via ORAL
  Filled 2023-12-19 (×13): qty 2

## 2023-12-19 MED ORDER — ENSURE ENLIVE PO LIQD
237.0000 mL | Freq: Two times a day (BID) | ORAL | Status: DC
  Administered 2023-12-20 – 2023-12-21 (×2): 237 mL via ORAL

## 2023-12-19 MED ORDER — LORAZEPAM 2 MG/ML IJ SOLN
INTRAMUSCULAR | Status: AC
  Administered 2023-12-19: 2 mg via INTRAVENOUS
  Filled 2023-12-19: qty 1

## 2023-12-19 MED ORDER — SODIUM CHLORIDE 0.9% IV SOLUTION: Freq: Once | INTRAVENOUS | Status: AC

## 2023-12-19 MED ORDER — ACETAMINOPHEN 325 MG PO TABS
650.0000 mg | ORAL_TABLET | Freq: Four times a day (QID) | ORAL | Status: DC | PRN
  Administered 2023-12-19 – 2023-12-21 (×3): 650 mg via ORAL
  Filled 2023-12-19 (×3): qty 2

## 2023-12-19 NOTE — Consult Note (Signed)
 PHARMACY CONSULT NOTE - FOLLOW UP  Pharmacy Consult for Electrolyte Monitoring and Replacement   Recent Labs: Potassium (mmol/L)  Date Value  12/19/2023 3.9  02/08/2015 3.6   Calcium (mg/dL)  Date Value  16/07/9603 7.6 (L)   Calcium, Total (mg/dL)  Date Value  54/06/8118 8.5 (L)   Albumin (g/dL)  Date Value  14/78/2956 2.3 (L)  06/24/2023 4.3  02/08/2015 3.5   Sodium (mmol/L)  Date Value  12/19/2023 132 (L)  06/24/2023 137  02/08/2015 138   Assessment: AD is a 43 yo female who presented to the ED with fever, chills, fatigue which acutely started the night before presenting. They reported urinary frequency and mild shortness of breath. They were admitted to the ICU due to urosepsis requiring low dose levo. They've received fluids and are on antibiotics. Pharmacy has been consulted to manage this patient's electrolytes while they're in the ICU.   Goal of Therapy:  Electrolytes WNL  Plan:  No replacement indicated at this time Continue to follow electrolytes with AM labs   Effie Shy, PharmD Pharmacy Resident  12/19/2023 6:26 AM

## 2023-12-19 NOTE — Plan of Care (Signed)
  Problem: Education: Goal: Ability to describe self-care measures that may prevent or decrease complications (Diabetes Survival Skills Education) will improve Outcome: Progressing   Problem: Coping: Goal: Ability to adjust to condition or change in health will improve Outcome: Progressing   Problem: Metabolic: Goal: Ability to maintain appropriate glucose levels will improve Outcome: Progressing   Problem: Skin Integrity: Goal: Risk for impaired skin integrity will decrease Outcome: Progressing   Problem: Activity: Goal: Risk for activity intolerance will decrease Outcome: Progressing   Problem: Nutrition: Goal: Adequate nutrition will be maintained Outcome: Progressing

## 2023-12-19 NOTE — Progress Notes (Signed)
 PHARMACY - PHYSICIAN COMMUNICATION CRITICAL VALUE ALERT - BLOOD CULTURE IDENTIFICATION (BCID)  Jessica Fields is an 43 y.o. female who presented to [facility] on 12/18/2023 with a chief complaint of sepsis.  Assessment:  1 of 4 bottles anaerobic with GPC - BCID resulted Staph species, resistance none.   Name of physician (or Provider) Contacted: Dr. Raechel Chute  Current antibiotics: Zosyn 3.375g IV q8h  Changes to prescribed antibiotics recommended:  Patient is on recommended antibiotics - No changes needed  No results found for this or any previous visit.  Jerrilyn Cairo, PharmD 12/19/2023 6:12 PM

## 2023-12-19 NOTE — Plan of Care (Signed)
  Problem: Education: Goal: Ability to describe self-care measures that may prevent or decrease complications (Diabetes Survival Skills Education) will improve Outcome: Progressing Goal: Individualized Educational Video(s) Outcome: Progressing   Problem: Coping: Goal: Ability to adjust to condition or change in health will improve Outcome: Progressing   Problem: Fluid Volume: Goal: Ability to maintain a balanced intake and output will improve Outcome: Progressing   Problem: Health Behavior/Discharge Planning: Goal: Ability to identify and utilize available resources and services will improve Outcome: Progressing Goal: Ability to manage health-related needs will improve Outcome: Progressing   Problem: Safety: Goal: Ability to remain free from injury will improve Outcome: Progressing   Problem: Pain Managment: Goal: General experience of comfort will improve and/or be controlled Outcome: Progressing   Problem: Elimination: Goal: Will not experience complications related to bowel motility Outcome: Progressing Goal: Will not experience complications related to urinary retention Outcome: Progressing

## 2023-12-19 NOTE — Consult Note (Incomplete)
 [facility] Anesthesia Consultation  Jessica Fields UJW:[id number] DOB: 30-Aug-1981 DOA: 12/18/2023 PCP: Malva Limes, MD   Requesting physician: Dr. Sonia Side Date of consultation: 12/19/2023 Reason for consultation: Pre-operative Evaluation   CHIEF COMPLAINT:  Obesity, Anemia  HISTORY OF PRESENT ILLNESS: Jessica Fields  is a 43 y.o. female with a known history of HTN, T2DM, OSA, HLD, chronic cholecystitis, anxiety and depression, morbid obesity, endometriosis, and IDA has recently been evaluated for surgical treatment of a pelvic mass. She has recently had vaginal bleeding and shortness of breath with exertion. She was started in IV Iron transfusions. Subsequently, She has been admitted to the ICU for treatment of urosepsis requiring crystalloid therapy, 1 unit prbc, and hemodynamic support with levophed. Hgb is 8 post infusion. She is now off levophed. She had an AKI that is improving.   PAST MEDICAL HISTORY:   Past Medical History:  Diagnosis Date   History of chicken pox    History of cholecystitis     PAST SURGICAL HISTORY:  Past Surgical History:  Procedure Laterality Date   Sleep Study  01/25/2005   Performed at [facility] by Dr. Ysidro Evert. Interpretation: very severe Sleep Apnea with nocturnal desaturations. She was improved with nasal Bi-PAP, although was not titrated to optimal pressure. I suspect she has nasal sinus obstruction or congestion    SOCIAL HISTORY:  Social History   Tobacco Use   Smoking status: Never   Smokeless tobacco: Never  Substance Use Topics   Alcohol use: Yes    Alcohol/week: 0.0 standard drinks of alcohol    Comment: occasional use    FAMILY HISTORY:  Family History  Problem Relation Age of Onset   Breast cancer Neg Hx    Colon cancer Neg Hx     DRUG ALLERGIES: No Known Allergies  REVIEW OF SYSTEMS:   RESPIRATORY: No cough, wheezing.  +SOB w/ exertion CARDIOVASCULAR: No chest pain, orthopnea, edema.   HEMATOLOGY: easy bruising or bleeding, + anemia SKIN: No rash or lesion. NEUROLOGIC: No tingling, numbness, weakness.  PSYCHIATRY: No anxiety or depression.   MEDICATIONS AT HOME:  Prior to Admission medications   Medication Sig Start Date End Date Taking? Authorizing Provider  amLODipine (NORVASC) 10 MG tablet TAKE 1 TABLET(10 MG) BY MOUTH DAILY 07/04/23  Yes Malva Limes, MD  celecoxib (CELEBREX) 200 MG capsule TAKE 1 CAPSULE BY MOUTH TWICE DAILY 07/06/23  Yes Malva Limes, MD  diclofenac sodium (VOLTAREN) 1 % GEL Apply 4 g topically 4 (four) times daily as needed. 07/25/18  Yes Malva Limes, MD  fluticasone (FLONASE) 50 MCG/ACT nasal spray Place 2 sprays into both nostrils daily as needed. 10/13/14  Yes [provider]  lisinopril-hydrochlorothiazide (ZESTORETIC) 10-12.5 MG tablet TAKE 1 TABLET BY MOUTH DAILY 10/25/23  Yes Malva Limes, MD  Melatonin 10 MG TABS Take 10 mg by mouth at bedtime as needed.   Yes [provider]  metFORMIN (GLUCOPHAGE) 500 MG tablet TAKE 1 TABLET(500 MG) BY MOUTH THREE TIMES DAILY WITH MEALS 02/18/23  Yes Malva Limes, MD  omeprazole (PRILOSEC) 20 MG capsule TAKE 1 CAPSULE(20 MG) BY MOUTH DAILY 06/06/23  Yes Malva Limes, MD  oxyCODONE-acetaminophen (PERCOCET) 7.5-325 MG tablet Take 2 tablets by mouth every 6 (six) hours as needed for severe pain (pain score 7-10). 12/12/23  Yes Malva Limes, MD  sertraline (ZOLOFT) 50 MG tablet TAKE 1 TABLET(50 MG) BY MOUTH DAILY 07/04/23  Yes Malva Limes, MD  tirzepatide [facility]) 10  MG/0.5ML Pen Inject 10 mg into the skin once a week. 06/24/23  Yes Malva Limes, MD  fluconazole (DIFLUCAN) 100 MG tablet Take 1 tablet (100 mg total) by mouth daily. Take after completing penicillin Patient not taking: Reported on 12/18/2023 09/23/23   Sherlyn Hay, DO  penicillin v potassium (VEETID) 500 MG tablet Take 1 tablet (500 mg total) by mouth 3 (three) times daily. Patient not taking:  Reported on 12/18/2023 09/23/23   Sherlyn Hay, DO  promethazine (PHENERGAN) 25 MG tablet Take 1 tablet (25 mg total) by mouth every 8 (eight) hours as needed for nausea or vomiting. Patient not taking: Reported on 12/18/2023 07/06/17   Malva Limes, MD      PHYSICAL EXAMINATION:   VITAL SIGNS: Blood pressure (!) 96/54, pulse (!) 110, temperature 37.4 C, temperature source Oral, resp. rate (!) 27, height 5\' 6"  (1.676 m), weight (!) 157.8 kg, SpO2 92%.  GENERAL:  43 y.o.-year-old patient no acute distress. Morbidly obese HEENT: Head atraumatic, normocephalic. Oropharynx and nasopharynx clear. MP 4, TM distance >3 cm, normal mouth opening. LUNGS: No use of accessory muscles of respiration.  CPAP next to bed EXTREMITIES: No pedal edema, cyanosis, or clubbing.  NEUROLOGIC: no gross CN deficits PSYCHIATRIC: The patient is alert and oriented x 3.  SKIN: No obvious rash, lesion, or ulcer.  Cardiac:  left and right parasternal systolic murmur.    IMPRESSION AND PLAN:   Jessica Fields  is a 43 y.o. female HTN, T2DM, OSA, HLD, chronic cholecystitis, anxiety and depression, morbid obesity with BMI 56, endometriosis, IDA, acute blood loss anemia, and

## 2023-12-19 NOTE — Plan of Care (Signed)

## 2023-12-19 NOTE — Progress Notes (Signed)
 NAME:  Jessica Fields, MRN:  [id number], DOB:  03/03/1981, LOS: 1 ADMISSION DATE:  12/18/2023, CHIEF COMPLAINT:  UTI   History of Present Illness:   43 y.o female with significant PMH of HTN, T2DM, HLD, Obesity, OSA, chronic cystitis, IDA, anxiety and depression, endometriosis who presented to the ED with chief complaints of abdominal pain, fevers, chills, fatigue with associated urinary symptoms since last night.  Patient also endorses mild shortness of breath which she is attributing to her IDA, received iron infusion 2 days ago.   ED Course: Initial vital signs showed HR of 140 beats/minute, BP 88/64mm Hg, the RR 33 breaths/minute, and the oxygen saturation 92% on RA and a temperature of 102.55F (39.4C). Pertinent Labs/Diagnostics Findings: Na+/ K+:133/4.1 Glucose: 165 BUN/Cr.:38/2.11  WBC: 34.3K/L with bands or neutrophil predominance Hgb/Hct: 7.8/25.6 Plts:599  PCT: 2.12 Lactic acid: 2.6 COVID PCR: Negative,  CXR> negative Patient given 30 cc/kg of fluids and started on broad-spectrum antibiotics Vanco cefepime and Flagyl for sepsis due to suspected UTI.  Patient remained hypotensive despite IVF boluses therefore was started on Levophed.   PCCM consulted.  Pertinent  Medical History  T2DM HTN HLD OSA Chronic cholecystitis IDA Anxiety and depression Morbid obesity Endometriosis  Significant Hospital Events: Including procedures, antibiotic start and stop dates in addition to other pertinent events   12/18/2023: admit to ICU, on vasopressors 12/19/2023: weaning nor-epi, received 1 unit PRBC  Interim History / Subjective:  Continues to feel weak, reports lower abdominal discomfort  Objective   Blood pressure 115/63, pulse 95, temperature 99.2 F (37.3 C), temperature source Oral, resp. rate (!) 29, height 5\' 6"  (1.676 m), weight (!) 157.8 kg, SpO2 93%.    FiO2 (%):  [21 %] 21 %   Intake/Output Summary (Last 24 hours) at 12/19/2023 1329 Last data filed at 12/19/2023 1310 Gross  per 24 hour  Intake 4623.8 ml  Output 825 ml  Net 3798.8 ml   Filed Weights   12/18/23 2256 12/19/23 0416  Weight: (!) 157.8 kg (!) 157.8 kg    Examination:  Physical Exam Constitutional:      Appearance: She is obese. She is ill-appearing.  Cardiovascular:     Rate and Rhythm: Normal rate and regular rhythm.     Pulses: Normal pulses.     Heart sounds: Normal heart sounds.  Pulmonary:     Effort: Pulmonary effort is normal.     Breath sounds: Normal breath sounds.  Abdominal:     General: There is distension.     Palpations: Abdomen is soft.     Tenderness: There is abdominal tenderness.  Neurological:     General: No focal deficit present.     Mental Status: She is alert and oriented to person, place, and time. Mental status is at baseline.     Assessment & Plan:    #Septic Shock #Acute Blood Loss Anemia #Acute Kidney Injury #UTI #Pelvic Cyst #Depression  Neurology - Continue PO sertraline. Pain control with PO oxycodone and acetaminophen. Cardiovascular - Septic shock secondary to UTI, but also potential contribution from acute blood loss anemia/hemorrhagic shock given increase in the size of the cyst noted on MRI. On nor-epi with goal MAP > 65. Received one unit of PRBC's today. Lactic acid initially elevated, now normalized. Pulmonary - on room air, no active issues. Gastrointestinal - diet as tolerated. Renal - kidney injury in the setting of sepsis with likely pre-renal etiology. No hydronephrosis on MRI of the abdomen. Has received fluid resuscitation (for sepsis)  and kidney function is improved on repeat today. Endocrine - ICU glycemic protocol Hem/Onc - blood loss anemia, noted to have a pelvic cyst that could be hemorrhagic or represent endometriosis. Received 1 unit of PRBC, will repeat H/H after completion and continue to monitor. Appreciate input from gynecology regarding management of said cyst. ID - septic shock secondary to UTI, with UA notable for  leukocyte esterases and WBC's. On broad spectrum antibiotics, with cultures (urine and blood) pending.   Best Practice (right click and "Reselect all SmartList Selections" daily)   Diet/type: Regular consistency (see orders) DVT prophylaxis not indicated Pressure ulcer(s): N/A GI prophylaxis: N/A Lines: N/A Foley:  Yes, and it is still needed Code Status:  full code Last date of multidisciplinary goals of care discussion [12/19/2023]  Labs   CBC: Recent Labs  Lab 12/12/23 1457 12/18/23 1806 12/19/23 0621  WBC 16.9* 34.3* 26.4*  NEUTROABS  --  29.9*  --   HGB 8.4* 7.8* 6.9*  HCT 27.3* 25.6* 22.2*  MCV 77.1* 77.6* 76.0*  PLT 611* 599* 508*    Basic Metabolic Panel: Recent Labs  Lab 12/18/23 1842 12/19/23 0331  NA 133* 132*  K 4.1 3.9  CL 99 102  CO2 21* 20*  GLUCOSE 165* 130*  BUN 38* 33*  CREATININE 2.11* 1.70*  CALCIUM 8.4* 7.6*   GFR: Estimated Creatinine Clearance: 65.8 mL/min (A) (by C-G formula based on SCr of 1.7 mg/dL (H)). Recent Labs  Lab 12/12/23 1457 12/18/23 1806 12/18/23 1842 12/18/23 2138 12/19/23 0621  PROCALCITON  --   --  2.12  --   --   WBC 16.9* 34.3*  --   --  26.4*  LATICACIDVEN  --  2.6*  --  1.7  --     Liver Function Tests: Recent Labs  Lab 12/18/23 1842  AST 19  ALT 11  ALKPHOS 90  BILITOT 0.5  PROT 7.8  ALBUMIN 2.3*   No results for input(s): "LIPASE", "AMYLASE" in the last 168 hours. No results for input(s): "AMMONIA" in the last 168 hours.  ABG No results found for: "PHART", "PCO2ART", "PO2ART", "HCO3", "TCO2", "ACIDBASEDEF", "O2SAT"   Coagulation Profile: Recent Labs  Lab 12/18/23 1806  INR 1.4*    Cardiac Enzymes: No results for input(s): "CKTOTAL", "CKMB", "CKMBINDEX", "TROPONINI" in the last 168 hours.  HbA1C: Hemoglobin A1C  Date/Time Value Ref Range Status  06/24/2023 11:01 AM 6.3 (A) 4.0 - 5.6 % Final  02/07/2023 04:32 PM 6.1 (A) 4.0 - 5.6 % Final   Hgb A1c MFr Bld  Date/Time Value Ref Range  Status  12/18/2023 06:06 PM 6.3 (H) 4.8 - 5.6 % Final    Comment:    (NOTE) Pre diabetes:          5.7%-6.4%  Diabetes:              >6.4%  Glycemic control for   <7.0% adults with diabetes   03/16/2022 01:48 PM 9.0 (H) 4.8 - 5.6 % Final    Comment:             Prediabetes: 5.7 - 6.4          Diabetes: >6.4          Glycemic control for adults with diabetes: <7.0     CBG: Recent Labs  Lab 12/18/23 2246 12/19/23 0719 12/19/23 1130  GLUCAP 138* 121* 140*    Review of Systems:   N/A  Past Medical History:  She,  has a past medical history of  History of chicken pox and History of cholecystitis.   Surgical History:   Past Surgical History:  Procedure Laterality Date   Sleep Study  01/25/2005   Performed at [facility] by Dr. Ysidro Evert. Interpretation: very severe Sleep Apnea with nocturnal desaturations. She was improved with nasal Bi-PAP, although was not titrated to optimal pressure. I suspect she has nasal sinus obstruction or congestion     Social History:   reports that she has never smoked. She has never used smokeless tobacco. She reports current alcohol use. She reports that she does not use drugs.   Family History:  Her family history is negative for Breast cancer and Colon cancer.   Allergies No Known Allergies   Home Medications  Prior to Admission medications   Medication Sig Start Date End Date Taking? Authorizing Provider  amLODipine (NORVASC) 10 MG tablet TAKE 1 TABLET(10 MG) BY MOUTH DAILY 07/04/23  Yes Malva Limes, MD  celecoxib (CELEBREX) 200 MG capsule TAKE 1 CAPSULE BY MOUTH TWICE DAILY 07/06/23  Yes Malva Limes, MD  diclofenac sodium (VOLTAREN) 1 % GEL Apply 4 g topically 4 (four) times daily as needed. 07/25/18  Yes Malva Limes, MD  fluticasone (FLONASE) 50 MCG/ACT nasal spray Place 2 sprays into both nostrils daily as needed. 10/13/14  Yes [provider]  lisinopril-hydrochlorothiazide (ZESTORETIC) 10-12.5 MG tablet  TAKE 1 TABLET BY MOUTH DAILY 10/25/23  Yes Malva Limes, MD  Melatonin 10 MG TABS Take 10 mg by mouth at bedtime as needed.   Yes [provider]  metFORMIN (GLUCOPHAGE) 500 MG tablet TAKE 1 TABLET(500 MG) BY MOUTH THREE TIMES DAILY WITH MEALS 02/18/23  Yes Malva Limes, MD  omeprazole (PRILOSEC) 20 MG capsule TAKE 1 CAPSULE(20 MG) BY MOUTH DAILY 06/06/23  Yes Malva Limes, MD  oxyCODONE-acetaminophen (PERCOCET) 7.5-325 MG tablet Take 2 tablets by mouth every 6 (six) hours as needed for severe pain (pain score 7-10). 12/12/23  Yes Malva Limes, MD  sertraline (ZOLOFT) 50 MG tablet TAKE 1 TABLET(50 MG) BY MOUTH DAILY 07/04/23  Yes Malva Limes, MD  tirzepatide [facility]) 10 MG/0.5ML Pen Inject 10 mg into the skin once a week. 06/24/23  Yes Malva Limes, MD  fluconazole (DIFLUCAN) 100 MG tablet Take 1 tablet (100 mg total) by mouth daily. Take after completing penicillin Patient not taking: Reported on 12/18/2023 09/23/23   Sherlyn Hay, DO  penicillin v potassium (VEETID) 500 MG tablet Take 1 tablet (500 mg total) by mouth 3 (three) times daily. Patient not taking: Reported on 12/18/2023 09/23/23   Sherlyn Hay, DO  promethazine (PHENERGAN) 25 MG tablet Take 1 tablet (25 mg total) by mouth every 8 (eight) hours as needed for nausea or vomiting. Patient not taking: Reported on 12/18/2023 07/06/17   Malva Limes, MD     Critical care time: 50 minutes    Raechel Chute, MD [facility] 12/19/2023 1:43 PM

## 2023-12-19 NOTE — Consult Note (Signed)
 Consult History and Physical   SERVICE: Gynecology   Patient Name: Jessica Fields Patient MRN:   [id number]  CC: abdominal pain, fever, and chills   HPI: Jessica Fields is a 43 y.o. G0P0000 who is admitted for septic shock secondary to UTI.  She initially presented to the ED with complaints of lower abdominal pain, fever, chills, fatigue, and UTI symptoms. She endorses mild shortness of breath which she attributes to recent diagnosis of iron deficiency anemia.  Jessica Fields was seen by Dr. Algis Downs Schermerhorn outpatient on 12/13/2023 following an earlier ED visit on 12/09/2023 for a pelvic mass. A possible large (10x10x9cm) septated lesion was identified on the posterior aspect of the uterine fundus that may represent a large degenerating fibroid verus a large hemorrhagic left ovarian cyst or endometrioma. Tumor markers (CA125, CEA, CA19-9) were negative with the exception of HE4 that was elevated (166). Her labs were also significant for anemia (hgb 8.1) and she was referred for IV iron. Jessica Fields was referred to Dr. Sonia Fields with gynecologic oncology for further evaluation and consult was scheduled for Wednesday. Jessica Fields [facility] was consulted for inpatient evaluation d/t anemia and pelvic mass.    Review of Systems  Constitutional:  Positive for chills, fever and malaise/fatigue.  Eyes:  Negative for blurred vision.  Respiratory:  Positive for shortness of breath (mild). Negative for cough.   Cardiovascular:  Negative for chest pain and palpitations.  Gastrointestinal:  Positive for abdominal pain. Negative for heartburn and nausea.  Genitourinary:  Positive for dysuria, frequency and urgency.  Musculoskeletal:  Positive for back pain. Negative for myalgias.  Neurological:  Negative for dizziness and headaches.  Psychiatric/Behavioral:  Negative for depression. The patient is not nervous/anxious.      Past Obstetrical History: OB History     Gravida  0   Para  0   Term  0   Preterm  0   AB  0   Living   0      SAB  0   IAB  0   Ectopic  0   Multiple  0   Live Births           Obstetric Comments  1st Menstrual Cycle:  10          Past Gynecologic History: No LMP recorded. (Menstrual status: IUD).   Past Medical History: Past Medical History:  Diagnosis Date   History of chicken pox    History of cholecystitis     Past Surgical History:   Past Surgical History:  Procedure Laterality Date   Sleep Study  01/25/2005   Performed at [facility] by Dr. Ysidro Evert. Interpretation: very severe Sleep Apnea with nocturnal desaturations. She was improved with nasal Bi-PAP, although was not titrated to optimal pressure. I suspect she has nasal sinus obstruction or congestion    Family History:  family history is not on file.  Social History:  Social History   Socioeconomic History   Marital status: Married    Spouse name: Not on file   Number of children: 0   Years of education: Not on file   Highest education level: Not on file  Occupational History   Occupation: Transport planner  Tobacco Use   Smoking status: Never   Smokeless tobacco: Never  Substance and Sexual Activity   Alcohol use: Yes    Alcohol/week: 0.0 standard drinks of alcohol    Comment: occasional use   Drug use: No   Sexual activity: Not on file  Other Topics  Concern   Not on file  Social History Narrative   Not on file   Social Drivers of Health   Financial Resource Strain: Low Risk  (12/13/2023)   Received from [facility]   Overall Financial Resource Strain (CARDIA)    Difficulty of Paying Living Expenses: Not very hard  Food Insecurity: No Food Insecurity (12/18/2023)   Hunger Vital Sign    Worried About Running Out of Food in the Last Year: Never true    Ran Out of Food in the Last Year: Never true  Transportation Needs: No Transportation Needs (12/18/2023)   PRAPARE - Administrator, Civil Service (Medical): No    Lack of Transportation (Non-Medical): No   Physical Activity: Not on file  Stress: Not on file  Social Connections: Not on file  Intimate Partner Violence: Not At Risk (12/18/2023)   Humiliation, Afraid, Rape, and Kick questionnaire    Fear of Current or Ex-Partner: No    Emotionally Abused: No    Physically Abused: No    Sexually Abused: No    Home Medications:  Medications reconciled in EPIC  No current facility-administered medications on file prior to encounter.   Current Outpatient Medications on File Prior to Encounter  Medication Sig Dispense Refill   amLODipine (NORVASC) 10 MG tablet TAKE 1 TABLET(10 MG) BY MOUTH DAILY 90 tablet 1   celecoxib (CELEBREX) 200 MG capsule TAKE 1 CAPSULE BY MOUTH TWICE DAILY 180 capsule 1   diclofenac sodium (VOLTAREN) 1 % GEL Apply 4 g topically 4 (four) times daily as needed. 100 g 5   fluticasone (FLONASE) 50 MCG/ACT nasal spray Place 2 sprays into both nostrils daily as needed.     lisinopril-hydrochlorothiazide (ZESTORETIC) 10-12.5 MG tablet TAKE 1 TABLET BY MOUTH DAILY 90 tablet 0   Melatonin 10 MG TABS Take 10 mg by mouth at bedtime as needed.     metFORMIN (GLUCOPHAGE) 500 MG tablet TAKE 1 TABLET(500 MG) BY MOUTH THREE TIMES DAILY WITH MEALS 270 tablet 4   omeprazole (PRILOSEC) 20 MG capsule TAKE 1 CAPSULE(20 MG) BY MOUTH DAILY 90 capsule 0   oxyCODONE-acetaminophen (PERCOCET) 7.5-325 MG tablet Take 2 tablets by mouth every 6 (six) hours as needed for severe pain (pain score 7-10). 120 tablet 0   sertraline (ZOLOFT) 50 MG tablet TAKE 1 TABLET(50 MG) BY MOUTH DAILY 90 tablet 1   tirzepatide (MOUNJARO) 10 MG/0.5ML Pen Inject 10 mg into the skin once a week. 2 mL 3   fluconazole (DIFLUCAN) 100 MG tablet Take 1 tablet (100 mg total) by mouth daily. Take after completing penicillin (Patient not taking: Reported on 12/18/2023) 1 tablet 0   penicillin v potassium (VEETID) 500 MG tablet Take 1 tablet (500 mg total) by mouth 3 (three) times daily. (Patient not taking: Reported on 12/18/2023) 30  tablet 0   promethazine (PHENERGAN) 25 MG tablet Take 1 tablet (25 mg total) by mouth every 8 (eight) hours as needed for nausea or vomiting. (Patient not taking: Reported on 12/18/2023) 20 tablet 0    Allergies:  No Known Allergies  Physical Exam:  Temp:  [98.6 F (37 C)-102.9 F (39.4 C)] 102.3 F (39.1 C) (03/03 1600) Pulse Rate:  [92-140] 107 (03/03 1600) Resp:  [18-42] 33 (03/03 1600) BP: (80-148)/(43-80) 148/80 (03/03 1600) SpO2:  [91 %-100 %] 98 % (03/03 1600) FiO2 (%):  [21 %] 21 % (03/03 0230) Weight:  [157.8 kg] 157.8 kg (03/03 0416)  Physical Exam Constitutional:  Appearance: She is obese.  Cardiovascular:     Rate and Rhythm: Tachycardia present.  Pulmonary:     Effort: Pulmonary effort is normal.  Abdominal:     Palpations: Abdomen is soft.     Tenderness: There is abdominal tenderness in the suprapubic area.  Musculoskeletal:        General: Normal range of motion.     Cervical back: Normal range of motion.  Skin:    General: Skin is warm and dry.     Capillary Refill: Capillary refill takes less than 2 seconds.  Neurological:     Mental Status: She is alert and oriented to person, place, and time.  Psychiatric:        Mood and Affect: Mood normal.      Labs/Studies:   CBC and Coags:  Lab Results  Component Value Date   WBC 25.1 (H) 12/19/2023   NEUTOPHILPCT 87 12/19/2023   EOSPCT 0 12/19/2023   BASOPCT 0 12/19/2023   LYMPHOPCT 6 12/19/2023   HGB 8.0 (L) 12/19/2023   HCT 25.3 (L) 12/19/2023   MCV 76.4 (L) 12/19/2023   PLT 491 (H) 12/19/2023   INR 1.4 (H) 12/18/2023   CMP:  Lab Results  Component Value Date   NA 132 (L) 12/19/2023   K 3.9 12/19/2023   CL 102 12/19/2023   CO2 20 (L) 12/19/2023   BUN 33 (H) 12/19/2023   CREATININE 1.70 (H) 12/19/2023   CREATININE 2.11 (H) 12/18/2023   CREATININE 0.98 12/09/2023   GLU 85 05/24/2013   PROT 7.8 12/18/2023   BILITOT 0.5 12/18/2023   ALT 11 12/18/2023   AST 19 12/18/2023   ALKPHOS 90  12/18/2023    Other Imaging: MR ABDOMEN W WO CONTRAST Result Date: 12/19/2023 CLINICAL DATA:  Adnexal mass, pelvic cystic lesion, low hemoglobin, concern for ruptured cyst EXAM: MRI ABDOMEN AND PELVIS WITHOUT AND WITH CONTRAST TECHNIQUE: Multiplanar multisequence MR imaging of the abdomen and pelvis was performed both before and after the administration of intravenous contrast. CONTRAST:  10mL GADAVIST GADOBUTROL 1 MMOL/ML IV SOLN COMPARISON:  12/09/2023 FINDINGS: COMBINED FINDINGS FOR BOTH MR ABDOMEN AND PELVIS Lower chest: No acute abnormality. Hepatobiliary: No focal liver abnormality is seen. Hepatomegaly, maximum coronal span 22.6 cm. Mild hepatic steatosis. Status post cholecystectomy. No biliary dilatation. Pancreas: Unremarkable. No pancreatic ductal dilatation or surrounding inflammatory changes. Spleen: Splenomegaly, maximum span 14.7 cm. Adrenals/Urinary Tract: Adrenal glands are unremarkable. Kidneys are normal, without renal calculi, solid lesion, or hydronephrosis. Bladder is unremarkable. Stomach/Bowel: Stomach is within normal limits. Appendix appears normal. No evidence of bowel wall thickening, distention, or inflammatory changes. Vascular/Lymphatic: No significant vascular findings are present. No enlarged abdominal or pelvic lymph nodes. Reproductive: Large cystic lesion in the pelvis has slightly increased in size, on today's examination measuring 11.7 x 11.1 cm, previously 10.5 x 10.0 cm when measured similarly (series 4, image 17). An internal septation has resolved, and this remains with homogeneous intrinsically T1 and T2 intermediate signal and is without internal contrast enhancement. As on prior examination, this is essentially inseparable from the posterior uterine fundus, and multiple uterine fibroids almost completely efface the normal endometrial cavity (series 12, image 21). No normal left ovarian tissue is visible. Normal right ovary with a small functional cyst or follicle  requiring no further follow-up or characterization. IUD in the fundal endometrial cavity. Other: No abdominal wall hernia or abnormality. No ascites. Musculoskeletal: No acute or significant osseous findings. IMPRESSION: 1. Large cystic lesion in the pelvis has slightly  increased in size, on today's examination measuring 11.7 x 11.1 cm, previously 10.5 x 10.0 cm when measured similarly. An internal septation has resolved, and this remains with homogeneous intrinsically T1 and T2 intermediate signal and is without internal contrast enhancement. Given behavior over the short interval and patient demographic, a large hemorrhagic ovarian cyst or alternately an endometrioma strongly favored. 2. No acute findings in the abdomen. Specifically no evidence of free fluid in the abdomen or pelvis to suggest cyst rupture. Electronically Signed   By: Jearld Lesch M.D.   On: 12/19/2023 07:03   MR PELVIS W WO CONTRAST Result Date: 12/19/2023 CLINICAL DATA:  Adnexal mass, pelvic cystic lesion, low hemoglobin, concern for ruptured cyst EXAM: MRI ABDOMEN AND PELVIS WITHOUT AND WITH CONTRAST TECHNIQUE: Multiplanar multisequence MR imaging of the abdomen and pelvis was performed both before and after the administration of intravenous contrast. CONTRAST:  10mL GADAVIST GADOBUTROL 1 MMOL/ML IV SOLN COMPARISON:  12/09/2023 FINDINGS: COMBINED FINDINGS FOR BOTH MR ABDOMEN AND PELVIS Lower chest: No acute abnormality. Hepatobiliary: No focal liver abnormality is seen. Hepatomegaly, maximum coronal span 22.6 cm. Mild hepatic steatosis. Status post cholecystectomy. No biliary dilatation. Pancreas: Unremarkable. No pancreatic ductal dilatation or surrounding inflammatory changes. Spleen: Splenomegaly, maximum span 14.7 cm. Adrenals/Urinary Tract: Adrenal glands are unremarkable. Kidneys are normal, without renal calculi, solid lesion, or hydronephrosis. Bladder is unremarkable. Stomach/Bowel: Stomach is within normal limits. Appendix appears  normal. No evidence of bowel wall thickening, distention, or inflammatory changes. Vascular/Lymphatic: No significant vascular findings are present. No enlarged abdominal or pelvic lymph nodes. Reproductive: Large cystic lesion in the pelvis has slightly increased in size, on today's examination measuring 11.7 x 11.1 cm, previously 10.5 x 10.0 cm when measured similarly (series 4, image 17). An internal septation has resolved, and this remains with homogeneous intrinsically T1 and T2 intermediate signal and is without internal contrast enhancement. As on prior examination, this is essentially inseparable from the posterior uterine fundus, and multiple uterine fibroids almost completely efface the normal endometrial cavity (series 12, image 21). No normal left ovarian tissue is visible. Normal right ovary with a small functional cyst or follicle requiring no further follow-up or characterization. IUD in the fundal endometrial cavity. Other: No abdominal wall hernia or abnormality. No ascites. Musculoskeletal: No acute or significant osseous findings. IMPRESSION: 1. Large cystic lesion in the pelvis has slightly increased in size, on today's examination measuring 11.7 x 11.1 cm, previously 10.5 x 10.0 cm when measured similarly. An internal septation has resolved, and this remains with homogeneous intrinsically T1 and T2 intermediate signal and is without internal contrast enhancement. Given behavior over the short interval and patient demographic, a large hemorrhagic ovarian cyst or alternately an endometrioma strongly favored. 2. No acute findings in the abdomen. Specifically no evidence of free fluid in the abdomen or pelvis to suggest cyst rupture. Electronically Signed   By: Jearld Lesch M.D.   On: 12/19/2023 07:03   DG Chest 2 View Result Date: 12/18/2023 CLINICAL DATA:  Fever and chills, weakness EXAM: CHEST - 2 VIEW COMPARISON:  None Available. FINDINGS: The heart size and mediastinal contours are within  normal limits. Both lungs are clear. The visualized skeletal structures are unremarkable. IMPRESSION: No active cardiopulmonary disease. Electronically Signed   By: Sharlet Salina M.D.   On: 12/18/2023 18:26   MR PELVIS W WO CONTRAST Result Date: 12/09/2023 CLINICAL DATA:  Adnexal mass, malignancy suspected, cystic lesion of the pelvis identified by prior ultrasound EXAM: MRI ABDOMEN AND PELVIS WITHOUT  AND WITH CONTRAST TECHNIQUE: Multiplanar multisequence MR imaging of the abdomen and pelvis was performed both before and after the administration of intravenous contrast. CONTRAST:  10mL GADAVIST GADOBUTROL 1 MMOL/ML IV SOLN COMPARISON:  None Available. FINDINGS: COMBINED FINDINGS FOR BOTH MR ABDOMEN AND PELVIS Lower chest: No acute abnormality. Hepatobiliary: No solid liver abnormality is seen. Hepatomegaly, maximum coronal span 21.9 cm. Status post cholecystectomy. Postoperative biliary ductal dilatation Pancreas: Unremarkable. No pancreatic ductal dilatation or surrounding inflammatory changes. Spleen: Splenomegaly, maximum span 14.6 cm. Adrenals/Urinary Tract: Adrenal glands are unremarkable. Kidneys are normal, without renal calculi, solid lesion, or hydronephrosis. Bladder is unremarkable. Stomach/Bowel: Stomach is within normal limits. Appendix appears normal. No evidence of bowel wall thickening, distention, or inflammatory changes. Vascular/Lymphatic: No significant vascular findings are present. No enlarged abdominal or pelvic lymph nodes. Reproductive: IUD in the endometrial cavity. Contrast enhancing fibroids which distort and almost completely efface endometrial cavity. At the posterior aspect of the uterine fundus and distorting the contour of the uterus, there is a large, septated lesion measuring 10.6 x 10.2 x 9.2 cm (series 24, image 15, series 26, image 18). This demonstrates heterogeneous internal T1 and T2 intermediate signal and by manual measurements (subtraction images not provided for  review), this does not appear to enhance on multiphasic sequences. Normal left ovarian tissue is not clearly visualized. Normal right ovary with a small, benign, functional cysts requiring no specific further follow-up or characterization. Other: No abdominal wall hernia or abnormality. No ascites. Musculoskeletal: No acute or significant osseous findings. IMPRESSION: 1. At the posterior aspect of the uterine fundus and distorting the contour of the uterus, there is a large, septated lesion measuring 10.6 x 10.2 x 9.2 cm. This demonstrates heterogeneous internal T1 and T2 intermediate and does not appear to enhance on multiphasic sequences. Given intimate relationship to the uterus this may reflect a large degenerated fibroid, or alternately a large hemorrhagic left ovarian cyst or endometrioma. Cystic ovarian malignancy not generally excluded but not favored. 2. Normal left ovarian tissue is not clearly visualized. 3. Normal right ovary with a small, benign, functional cyst requiring no specific further follow-up or characterization. 4. IUD in the endometrial cavity. Non degenerated uterine fibroids which distort and almost completely efface endometrial cavity. Electronically Signed   By: Jearld Lesch M.D.   On: 12/09/2023 15:50   MR Abdomen W or Wo Contrast Result Date: 12/09/2023 CLINICAL DATA:  Adnexal mass, malignancy suspected, cystic lesion of the pelvis identified by prior ultrasound EXAM: MRI ABDOMEN AND PELVIS WITHOUT AND WITH CONTRAST TECHNIQUE: Multiplanar multisequence MR imaging of the abdomen and pelvis was performed both before and after the administration of intravenous contrast. CONTRAST:  10mL GADAVIST GADOBUTROL 1 MMOL/ML IV SOLN COMPARISON:  None Available. FINDINGS: COMBINED FINDINGS FOR BOTH MR ABDOMEN AND PELVIS Lower chest: No acute abnormality. Hepatobiliary: No solid liver abnormality is seen. Hepatomegaly, maximum coronal span 21.9 cm. Status post cholecystectomy. Postoperative  biliary ductal dilatation Pancreas: Unremarkable. No pancreatic ductal dilatation or surrounding inflammatory changes. Spleen: Splenomegaly, maximum span 14.6 cm. Adrenals/Urinary Tract: Adrenal glands are unremarkable. Kidneys are normal, without renal calculi, solid lesion, or hydronephrosis. Bladder is unremarkable. Stomach/Bowel: Stomach is within normal limits. Appendix appears normal. No evidence of bowel wall thickening, distention, or inflammatory changes. Vascular/Lymphatic: No significant vascular findings are present. No enlarged abdominal or pelvic lymph nodes. Reproductive: IUD in the endometrial cavity. Contrast enhancing fibroids which distort and almost completely efface endometrial cavity. At the posterior aspect of the uterine fundus and distorting the contour  of the uterus, there is a large, septated lesion measuring 10.6 x 10.2 x 9.2 cm (series 24, image 15, series 26, image 18). This demonstrates heterogeneous internal T1 and T2 intermediate signal and by manual measurements (subtraction images not provided for review), this does not appear to enhance on multiphasic sequences. Normal left ovarian tissue is not clearly visualized. Normal right ovary with a small, benign, functional cysts requiring no specific further follow-up or characterization. Other: No abdominal wall hernia or abnormality. No ascites. Musculoskeletal: No acute or significant osseous findings. IMPRESSION: 1. At the posterior aspect of the uterine fundus and distorting the contour of the uterus, there is a large, septated lesion measuring 10.6 x 10.2 x 9.2 cm. This demonstrates heterogeneous internal T1 and T2 intermediate and does not appear to enhance on multiphasic sequences. Given intimate relationship to the uterus this may reflect a large degenerated fibroid, or alternately a large hemorrhagic left ovarian cyst or endometrioma. Cystic ovarian malignancy not generally excluded but not favored. 2. Normal left ovarian  tissue is not clearly visualized. 3. Normal right ovary with a small, benign, functional cyst requiring no specific further follow-up or characterization. 4. IUD in the endometrial cavity. Non degenerated uterine fibroids which distort and almost completely efface endometrial cavity. Electronically Signed   By: Jearld Lesch M.D.   On: 12/09/2023 15:50   US PELVIC COMPLETE W TRANSVAGINAL AND TORSION R/O Result Date: 12/09/2023 CLINICAL DATA:  Lower abdominal pain, vaginal bleeding for 10 days. EXAM: TRANSABDOMINAL AND TRANSVAGINAL ULTRASOUND OF PELVIS DOPPLER ULTRASOUND OF OVARIES TECHNIQUE: Both transabdominal and transvaginal ultrasound examinations of the pelvis were performed. Transabdominal technique was performed for global imaging of the pelvis including uterus, ovaries, adnexal regions, and pelvic cul-de-sac. It was necessary to proceed with endovaginal exam following the transabdominal exam to visualize the endometrium and ovaries. Color and duplex Doppler ultrasound was utilized to evaluate blood flow to the ovaries. COMPARISON:  March 24, 2009. FINDINGS: Uterus Measurements: 6.2 x 3.8 x 3.1 cm = volume: 38 mL. Probable fibroid measuring 4.9 x 4.7 x 4.0 cm is noted posteriorly. Endometrium Thickness: 5 mm which is within normal limits. No focal abnormality visualized. Right ovary Not clearly visualized. Left ovary Not clearly visualized. Pulsed Doppler evaluation of cystic mass posteriorly and pelvis demonstrates normal low-resistance arterial and venous waveforms. Other findings 9.3 x 9.1 x 8.7 cm mildly complex cystic lesion is noted posteriorly in the midline of the pelvis. This may be ovarian in etiology, but it cannot be determined from which ovary it arises, if any. Potentially may represent endometrioma. IMPRESSION: Ovaries are not clearly visualized and therefore ovarian torsion cannot be excluded on the basis of this exam. 9.3 x 9.1 x 8.7 cm mildly complex cystic lesion is noted posteriorly in  the midline of the pelvis. This may be ovarian etiology, but it cannot be determined from which ovary it arises, if any. Potentially this may represent endometrioma. MRI is recommended for further evaluation. Probable 4.9 cm fibroid noted posteriorly and uterus. Electronically Signed   By: Lupita Raider M.D.   On: 12/09/2023 13:07     Assessment / Plan:   Jessica Fields is a 43 y.o. G0P0000 who is admitted for septic shock secondary to UTI.   Pelvic mass -Reviewed assessment, labs, and imaging with Dr. Dalbert Garnet  -Lesion overall stable lesion from previous imaging  -No evidence of intraabdominal bleeding. MRI does not show free fluid or compression of surrounding structures  -Do not recommend surgery at this time  -  Scheduled for anesthesia consult tomorrow. Will discuss with anesthesia team to see if this can be completed inpatient.  -Scheduled for consult to discuss options for further evaluation with Dr. Sonia Fields on Wednesday. Will discuss with Gyn/Onc team if this consult can be done inpatient if patient is still admitted    Anemia: -12/09/23 - hgb 8.4->7.8->6.9->1unit pRBC->8.0 -Received 1 unit pRBC - feeling better since blood transfusion  -Anemia stable, will continue to follow labs per medical team    Thank you for the opportunity to be involved with this patient's care.  ----- Margaretmary Eddy, CNM Midwife [facility], Department of OB/GYN [facility]

## 2023-12-20 ENCOUNTER — Inpatient Hospital Stay (HOSPITAL_COMMUNITY)
Admit: 2023-12-20 | Discharge: 2023-12-20 | Disposition: A | Discharge status: Home / Self Care | Attending: Student in an Organized Health Care Education/Training Program | Admitting: Student in an Organized Health Care Education/Training Program

## 2023-12-20 ENCOUNTER — Telehealth: Payer: Self-pay

## 2023-12-20 ENCOUNTER — Inpatient Hospital Stay
Admission: RE | Admit: 2023-12-20 | Discharge: 2023-12-20 | Disposition: A | Discharge status: Home / Self Care | Payer: 59 | Source: Ambulatory Visit

## 2023-12-20 ENCOUNTER — Inpatient Hospital Stay
Admit: 2023-12-20 | Discharge: 2023-12-20 | Disposition: A | Discharge status: Home / Self Care | Attending: Student in an Organized Health Care Education/Training Program

## 2023-12-20 DIAGNOSIS — D62 Acute posthemorrhagic anemia: Secondary | ICD-10-CM | POA: Diagnosis not present

## 2023-12-20 DIAGNOSIS — N179 Acute kidney failure, unspecified: Secondary | ICD-10-CM | POA: Diagnosis not present

## 2023-12-20 DIAGNOSIS — R011 Cardiac murmur, unspecified: Secondary | ICD-10-CM | POA: Diagnosis not present

## 2023-12-20 DIAGNOSIS — R6521 Severe sepsis with septic shock: Secondary | ICD-10-CM | POA: Diagnosis not present

## 2023-12-20 DIAGNOSIS — A419 Sepsis, unspecified organism: Secondary | ICD-10-CM | POA: Diagnosis not present

## 2023-12-20 LAB — CBC
HCT: 21 % — ABNORMAL LOW (ref 36.0–46.0)
HCT: 26.6 % — ABNORMAL LOW (ref 36.0–46.0)
Hemoglobin: 6.7 g/dL — ABNORMAL LOW (ref 12.0–15.0)
Hemoglobin: 8.5 g/dL — ABNORMAL LOW (ref 12.0–15.0)
MCH: 23.8 pg — ABNORMAL LOW (ref 26.0–34.0)
MCH: 24.9 pg — ABNORMAL LOW (ref 26.0–34.0)
MCHC: 31.9 g/dL (ref 30.0–36.0)
MCHC: 32 g/dL (ref 30.0–36.0)
MCV: 74.7 fL — ABNORMAL LOW (ref 80.0–100.0)
MCV: 77.8 fL — ABNORMAL LOW (ref 80.0–100.0)
Platelets: 459 10*3/uL — ABNORMAL HIGH (ref 150–400)
Platelets: 488 10*3/uL — ABNORMAL HIGH (ref 150–400)
RBC: 2.81 MIL/uL — ABNORMAL LOW (ref 3.87–5.11)
RBC: 3.42 MIL/uL — ABNORMAL LOW (ref 3.87–5.11)
RDW: 16.1 % — ABNORMAL HIGH (ref 11.5–15.5)
RDW: 16.2 % — ABNORMAL HIGH (ref 11.5–15.5)
WBC: 18.8 10*3/uL — ABNORMAL HIGH (ref 4.0–10.5)
WBC: 18.1 10*3/uL — ABNORMAL HIGH (ref 4.0–10.5)
nRBC: 0 % (ref 0.0–0.2)
nRBC: 0 % (ref 0.0–0.2)

## 2023-12-20 LAB — BASIC METABOLIC PANEL
Anion gap: 10 (ref 5–15)
BUN: 32 mg/dL — ABNORMAL HIGH (ref 6–20)
CO2: 23 mmol/L (ref 22–32)
Calcium: 8 mg/dL — ABNORMAL LOW (ref 8.9–10.3)
Chloride: 103 mmol/L (ref 98–111)
Creatinine, Ser: 1.37 mg/dL — ABNORMAL HIGH (ref 0.44–1.00)
GFR, Estimated: 49 mL/min — ABNORMAL LOW (ref >60)
Glucose, Bld: 117 mg/dL — ABNORMAL HIGH (ref 70–99)
Potassium: 3.8 mmol/L (ref 3.5–5.1)
Sodium: 136 mmol/L (ref 135–145)

## 2023-12-20 LAB — PREPARE RBC (CROSSMATCH)

## 2023-12-20 LAB — ECHOCARDIOGRAM COMPLETE
AR max vel: 2.4 cm2
AV Area VTI: 2.28 cm2
AV Area mean vel: 2.17 cm2
AV Mean grad: 8 mmHg
AV Peak grad: 16.6 mmHg
Ao pk vel: 2.04 m/s
Area-P 1/2: 4.33 cm2
Calc EF: 58.3 %
Height: 66 in
MV VTI: 2.43 cm2
S' Lateral: 3.3 cm
Single Plane A2C EF: 56.9 %
Single Plane A4C EF: 57.6 %
Weight: 5640.25 [oz_av]

## 2023-12-20 LAB — GLUCOSE, CAPILLARY
Glucose-Capillary: 105 mg/dL — ABNORMAL HIGH (ref 70–99)
Glucose-Capillary: 135 mg/dL — ABNORMAL HIGH (ref 70–99)
Glucose-Capillary: 100 mg/dL — ABNORMAL HIGH (ref 70–99)
Glucose-Capillary: 121 mg/dL — ABNORMAL HIGH (ref 70–99)

## 2023-12-20 LAB — HEMOGLOBIN AND HEMATOCRIT, BLOOD
HCT: 24.9 % — ABNORMAL LOW (ref 36.0–46.0)
Hemoglobin: 7.9 g/dL — ABNORMAL LOW (ref 12.0–15.0)

## 2023-12-20 MED ORDER — DIPHENHYDRAMINE HCL 25 MG PO CAPS
50.0000 mg | ORAL_CAPSULE | Freq: Four times a day (QID) | ORAL | Status: DC | PRN
  Administered 2023-12-20 – 2023-12-22 (×4): 50 mg via ORAL
  Filled 2023-12-20 (×4): qty 2

## 2023-12-20 MED ORDER — SODIUM CHLORIDE 0.9% IV SOLUTION: Freq: Once | INTRAVENOUS | Status: DC

## 2023-12-20 MED ORDER — SODIUM CHLORIDE 0.9% IV SOLUTION: Freq: Once | INTRAVENOUS | Status: AC

## 2023-12-20 NOTE — Consult Note (Signed)
 [facility] Anesthesia Consultation   Jessica Fields ZOX:[id number] DOB: [birth year] DOA: 12/18/2023 PCP: Malva Limes, MD    Requesting physician: Dr. Sonia Side Date of consultation: 12/20/2023 Reason for consultation: Pre-operative Evaluation    CHIEF COMPLAINT:  Obesity, Anemia   HISTORY OF PRESENT ILLNESS: Jessica Fields  is a 43 y.o. female with a known history of HTN, T2DM, OSA, HLD, chronic cholecystitis, anxiety and depression, morbid obesity, endometriosis, and IDA has recently been evaluated for surgical treatment of a pelvic mass. She has recently had vaginal bleeding and shortness of breath with exertion. She was started in IV Iron transfusions. Subsequently, She has been admitted to the ICU for treatment of urosepsis requiring crystalloid therapy, 1 unit prbc, and hemodynamic support with levophed. Hgb is 8 post infusion, however keeps dropping. She is now off levophed. She had an AKI that is improving.    PAST MEDICAL HISTORY:       Past Medical History:  Diagnosis Date   History of chicken pox     History of cholecystitis            PAST SURGICAL HISTORY:       Past Surgical History:  Procedure Laterality Date   Sleep Study   01/25/2005    Performed at [facility] by Dr. Ysidro Evert. Interpretation: very severe Sleep Apnea with nocturnal desaturations. She was improved with nasal Bi-PAP, although was not titrated to optimal pressure. I suspect she has nasal sinus obstruction or congestion          SOCIAL HISTORY:  Social History         Tobacco Use   Smoking status: Never   Smokeless tobacco: Never  Substance Use Topics   Alcohol use: Yes      Alcohol/week: 0.0 standard drinks of alcohol      Comment: occasional use      FAMILY HISTORY:       Family History  Problem Relation Age of Onset   Breast cancer Neg Hx     Colon cancer Neg Hx            DRUG ALLERGIES:  Allergies  No Known Allergies     REVIEW OF SYSTEMS:    RESPIRATORY: No  cough, wheezing.  +SOB w/ exertion CARDIOVASCULAR: No chest pain, orthopnea, edema.  HEMATOLOGY: easy bruising or bleeding, + anemia SKIN: No rash or lesion. NEUROLOGIC: No tingling, numbness, weakness.  PSYCHIATRY: No anxiety or depression.    MEDICATIONS AT HOME:         Prior to Admission medications   Medication Sig Start Date End Date Taking? Authorizing Provider  amLODipine (NORVASC) 10 MG tablet TAKE 1 TABLET(10 MG) BY MOUTH DAILY 07/04/23   Yes Malva Limes, MD  celecoxib (CELEBREX) 200 MG capsule TAKE 1 CAPSULE BY MOUTH TWICE DAILY 07/06/23   Yes Malva Limes, MD  diclofenac sodium (VOLTAREN) 1 % GEL Apply 4 g topically 4 (four) times daily as needed. 07/25/18   Yes Malva Limes, MD  fluticasone (FLONASE) 50 MCG/ACT nasal spray Place 2 sprays into both nostrils daily as needed. 10/13/14   Yes [provider]  lisinopril-hydrochlorothiazide (ZESTORETIC) 10-12.5 MG tablet TAKE 1 TABLET BY MOUTH DAILY 10/25/23   Yes Malva Limes, MD  Melatonin 10 MG TABS Take 10 mg by mouth at bedtime as needed.     Yes [provider]  metFORMIN (GLUCOPHAGE) 500 MG tablet TAKE 1 TABLET(500 MG) BY MOUTH THREE TIMES DAILY WITH MEALS 02/18/23  Yes Malva Limes, MD  omeprazole (PRILOSEC) 20 MG capsule TAKE 1 CAPSULE(20 MG) BY MOUTH DAILY 06/06/23   Yes Malva Limes, MD  oxyCODONE-acetaminophen (PERCOCET) 7.5-325 MG tablet Take 2 tablets by mouth every 6 (six) hours as needed for severe pain (pain score 7-10). 12/12/23   Yes Malva Limes, MD  sertraline (ZOLOFT) 50 MG tablet TAKE 1 TABLET(50 MG) BY MOUTH DAILY 07/04/23   Yes Malva Limes, MD  tirzepatide [facility]) 10 MG/0.5ML Pen Inject 10 mg into the skin once a week. 06/24/23   Yes Malva Limes, MD  fluconazole (DIFLUCAN) 100 MG tablet Take 1 tablet (100 mg total) by mouth daily. Take after completing penicillin Patient not taking: Reported on 12/18/2023 09/23/23     Sherlyn Hay, DO  penicillin v potassium  (VEETID) 500 MG tablet Take 1 tablet (500 mg total) by mouth 3 (three) times daily. Patient not taking: Reported on 12/18/2023 09/23/23     Sherlyn Hay, DO  promethazine (PHENERGAN) 25 MG tablet Take 1 tablet (25 mg total) by mouth every 8 (eight) hours as needed for nausea or vomiting. Patient not taking: Reported on 12/18/2023 07/06/17     Malva Limes, MD        PHYSICAL EXAMINATION:    VITAL SIGNS: Blood pressure (!) 96/54, pulse (!) 110, temperature 37.4 C, temperature source Oral, resp. rate (!) 27, height 5\' 6"  (1.676 m), weight (!) 157.8 kg, SpO2 92%.   GENERAL:  43 y.o.-year-old patient no acute distress. Morbidly obese HEENT: Head atraumatic, normocephalic. Oropharynx and nasopharynx clear. MP 4, TM distance >3 cm, normal mouth opening. LUNGS: No use of accessory muscles of respiration.  CPAP next to bed EXTREMITIES: No pedal edema, cyanosis, or clubbing.  NEUROLOGIC: no gross CN deficits PSYCHIATRIC: The patient is alert and oriented x 3.  SKIN: No obvious rash, lesion, or ulcer.  Cardiac:  left and right parasternal systolic murmur.      IMPRESSION AND PLAN:     Jessica Fields  is a 43 y.o. female HTN, T2DM, OSA, HLD, chronic cholecystitis, anxiety and depression, morbid obesity with BMI 56, endometriosis, IDA, acute blood loss anemia, and urosepsis 2/2 UTI. Found to have 10cmx10cmx9cm pelvic mass, thought to perhaps be the cause for anemia.  Patient is obviously high risk for anesthetic complications due to her Class 4 obesity, OSA which has been graded as "very severe" on sleep study, and persistent anemia.  Echo read is pending.  Current consult note from GYN says no current indication for surgery. If the plan is to stabilize the patient and discharge her with outpatient gynecological followup, this patient would best be served at a tertiary care center with higher resources for high BMI patients, readily available blood products [facility] does not often have platelets on  hand), intra-op TEE availability depending on echo results.  If GYN and ICU team deem that patient needs to have this surgery urgently this admission, we would request the following for optimization: Continuation of appropriate antibiotics for septic shock. Wean off pressors, unless it is deemed that the reason for hypotension is hemorrhagic shock. Current literature states that a preoperative hemoglobin LESS THAN 13 g/dL leads to statistically significant increases in morbidity/mortality. This is likely a high target for this patient, but at the very least, targeting a preop Hgb > 8 g/dL, if >13 g/dL cannot be achieved.

## 2023-12-20 NOTE — Consult Note (Signed)
 PHARMACY CONSULT NOTE - FOLLOW UP  Pharmacy Consult for Electrolyte Monitoring and Replacement   Recent Labs: Potassium (mmol/L)  Date Value  12/20/2023 3.8  02/08/2015 3.6   Calcium (mg/dL)  Date Value  54/06/8118 8.0 (L)   Calcium, Total (mg/dL)  Date Value  14/78/2956 8.5 (L)   Albumin (g/dL)  Date Value  21/30/8657 2.3 (L)  06/24/2023 4.3  02/08/2015 3.5   Sodium (mmol/L)  Date Value  12/20/2023 136  06/24/2023 137  02/08/2015 138   Assessment: AD is a 43 yo female who presented to the ED with fever, chills, fatigue which acutely started the night before presenting. They reported urinary frequency and mild shortness of breath. They were admitted to the ICU due to urosepsis requiring low dose levo. They've received fluids and are on antibiotics. Pharmacy has been consulted to manage this patient's electrolytes while they're in the ICU.   Goal of Therapy:  Electrolytes WNL  Plan:  No replacement indicated at this time Continue to follow electrolytes with AM labs   Effie Shy, PharmD Pharmacy Resident  12/20/2023 6:19 AM

## 2023-12-20 NOTE — Telephone Encounter (Signed)
 Referral received for gyn oncology. Remains in ICU. Anesthesia consult appreciated. Dr. Sonia Side will be available 12/21/2023 to review case.

## 2023-12-20 NOTE — Progress Notes (Signed)
*  PRELIMINARY RESULTS* Echocardiogram 2D Echocardiogram has been performed.  Carolyne Fiscal 12/20/2023, 9:38 AM

## 2023-12-20 NOTE — Plan of Care (Signed)

## 2023-12-20 NOTE — Progress Notes (Signed)
 NAME:  Jessica Fields, MRN:  [id number], DOB:  1981/01/23, LOS: 2 ADMISSION DATE:  12/18/2023, CHIEF COMPLAINT:  UTI   History of Present Illness:   43 y.o female with significant PMH of HTN, T2DM, HLD, Obesity, OSA, chronic cystitis, IDA, anxiety and depression, endometriosis who presented to the ED with chief complaints of abdominal pain, fevers, chills, fatigue with associated urinary symptoms since last night.  Patient also endorses mild shortness of breath which she is attributing to her IDA, received iron infusion 2 days ago.   ED Course: Initial vital signs showed HR of 140 beats/minute, BP 88/73mm Hg, the RR 33 breaths/minute, and the oxygen saturation 92% on RA and a temperature of 102.62F (39.4C). Pertinent Labs/Diagnostics Findings: Na+/ K+:133/4.1 Glucose: 165 BUN/Cr.:38/2.11  WBC: 34.3K/L with bands or neutrophil predominance Hgb/Hct: 7.8/25.6 Plts:599  PCT: 2.12 Lactic acid: 2.6 COVID PCR: Negative,  CXR> negative Patient given 30 cc/kg of fluids and started on broad-spectrum antibiotics Vanco cefepime and Flagyl for sepsis due to suspected UTI.  Patient remained hypotensive despite IVF boluses therefore was started on Levophed.   PCCM consulted.  Pertinent  Medical History  T2DM HTN HLD OSA Chronic cholecystitis IDA Anxiety and depression Morbid obesity Endometriosis  Significant Hospital Events: Including procedures, antibiotic start and stop dates in addition to other pertinent events   12/18/2023: admit to ICU, on vasopressors 12/19/2023: weaning nor-epi, received 1 unit PRBC 12/20/2023: drifting hemoglobin, received another unit of PRBC's, off pressors overnight  Interim History / Subjective:  Feels improved overall, less abdominal discomfort.  Objective   Blood pressure (!) 105/47, pulse 92, temperature 98 F (36.7 C), temperature source Oral, resp. rate (!) 26, height 5\' 6"  (1.676 m), weight (!) 159.9 kg, SpO2 94%.        Intake/Output Summary (Last 24  hours) at 12/20/2023 1204 Last data filed at 12/20/2023 1036 Gross per 24 hour  Intake 533.04 ml  Output 925 ml  Net -391.96 ml   Filed Weights   12/18/23 2256 12/19/23 0416 12/20/23 0500  Weight: (!) 157.8 kg (!) 157.8 kg (!) 159.9 kg    Examination:  Physical Exam Constitutional:      Appearance: She is obese. She is ill-appearing.  Cardiovascular:     Rate and Rhythm: Normal rate and regular rhythm.     Pulses: Normal pulses.     Heart sounds: Normal heart sounds.  Pulmonary:     Effort: Pulmonary effort is normal.     Breath sounds: Normal breath sounds.  Abdominal:     General: There is distension.     Palpations: Abdomen is soft.     Tenderness: There is abdominal tenderness.  Neurological:     General: No focal deficit present.     Mental Status: She is alert and oriented to person, place, and time. Mental status is at baseline.     Assessment & Plan:    #Septic Shock #Acute Blood Loss Anemia #Acute Kidney Injury #UTI #Pelvic Cyst #Depression  Neurology - Continue PO sertraline. Pain control with PO oxycodone and acetaminophen. No change overnight Cardiovascular - Septic shock secondary to UTI, but also potential contribution from acute blood loss anemia/hemorrhagic shock given increase in the size of the cyst noted on MRI. We've weaned her off nor-epinephrine, now off since last night. Received one unit of PRBC from overnight, with repeat transfusion this AM. LA normalized. Pulmonary - on room air, no active issues. Gastrointestinal - diet as tolerated. Renal - kidney injury in the setting of  sepsis with likely pre-renal etiology. Improved kidney function following resuscitation. No hydronephrosis on MRI. Monitor kidney function and electrolytes, replete as necessary. Endocrine - ICU glycemic protocol Hem/Onc - blood loss anemia, noted to have a pelvic cyst that could be hemorrhagic or represent endometriosis. Received 1 unit of PRBC yesterday, and 2 this AM.  Repeat H/H stable, but I am concerned that she has a GYN source to her bleed. Will also consider GI sources of bleeding should GYN workup return non-revealing. Appreciate input from GYN. Monitor H/H q8h. ID - septic shock secondary to UTI, with UA notable for leukocyte esterases and WBC's. On broad spectrum antibiotics, with cultures (urine and blood) pending.   Best Practice (right click and "Reselect all SmartList Selections" daily)   Diet/type: Regular consistency (see orders) DVT prophylaxis not indicated Pressure ulcer(s): N/A GI prophylaxis: N/A Lines: N/A Foley:  Yes, and it is still needed Code Status:  full code Last date of multidisciplinary goals of care discussion [12/20/2023]  Labs   CBC: Recent Labs  Lab 12/18/23 1806 12/19/23 0621 12/19/23 1457 12/19/23 2038 12/20/23 0346 12/20/23 1039  WBC 34.3* 26.4* 25.1*  --  18.8*  --   NEUTROABS 29.9*  --  21.8*  --   --   --   HGB 7.8* 6.9* 8.0* 7.0* 6.7* 7.9*  HCT 25.6* 22.2* 25.3* 21.8* 21.0* 24.9*  MCV 77.6* 76.0* 76.4*  --  74.7*  --   PLT 599* 508* 491*  --  459*  --     Basic Metabolic Panel: Recent Labs  Lab 12/18/23 1842 12/19/23 0331 12/20/23 0346  NA 133* 132* 136  K 4.1 3.9 3.8  CL 99 102 103  CO2 21* 20* 23  GLUCOSE 165* 130* 117*  BUN 38* 33* 32*  CREATININE 2.11* 1.70* 1.37*  CALCIUM 8.4* 7.6* 8.0*   GFR: Estimated Creatinine Clearance: 82.3 mL/min (A) (by C-G formula based on SCr of 1.37 mg/dL (H)). Recent Labs  Lab 12/18/23 1806 12/18/23 1842 12/18/23 2138 12/19/23 0621 12/19/23 1457 12/19/23 2038 12/20/23 0346  PROCALCITON  --  2.12  --   --   --   --   --   WBC 34.3*  --   --  26.4* 25.1*  --  18.8*  LATICACIDVEN 2.6*  --  1.7  --   --  0.7  --     Liver Function Tests: Recent Labs  Lab 12/18/23 1842  AST 19  ALT 11  ALKPHOS 90  BILITOT 0.5  PROT 7.8  ALBUMIN 2.3*   No results for input(s): "LIPASE", "AMYLASE" in the last 168 hours. No results for input(s): "AMMONIA" in  the last 168 hours.  ABG No results found for: "PHART", "PCO2ART", "PO2ART", "HCO3", "TCO2", "ACIDBASEDEF", "O2SAT"   Coagulation Profile: Recent Labs  Lab 12/18/23 1806  INR 1.4*    Cardiac Enzymes: No results for input(s): "CKTOTAL", "CKMB", "CKMBINDEX", "TROPONINI" in the last 168 hours.  HbA1C: Hemoglobin A1C  Date/Time Value Ref Range Status  06/24/2023 11:01 AM 6.3 (A) 4.0 - 5.6 % Final  02/07/2023 04:32 PM 6.1 (A) 4.0 - 5.6 % Final   Hgb A1c MFr Bld  Date/Time Value Ref Range Status  12/18/2023 06:06 PM 6.3 (H) 4.8 - 5.6 % Final    Comment:    (NOTE) Pre diabetes:          5.7%-6.4%  Diabetes:              >6.4%  Glycemic control for   <7.0%  adults with diabetes   03/16/2022 01:48 PM 9.0 (H) 4.8 - 5.6 % Final    Comment:             Prediabetes: 5.7 - 6.4          Diabetes: >6.4          Glycemic control for adults with diabetes: <7.0     CBG: Recent Labs  Lab 12/19/23 1130 12/19/23 1604 12/19/23 2122 12/20/23 0755 12/20/23 1144  GLUCAP 140* 109* 117* 105* 135*    Review of Systems:   N/A  Past Medical History:  She,  has a past medical history of History of chicken pox and History of cholecystitis.   Surgical History:   Past Surgical History:  Procedure Laterality Date   Sleep Study  01/25/2005   Performed at [facility] by Dr. Ysidro Evert. Interpretation: very severe Sleep Apnea with nocturnal desaturations. She was improved with nasal Bi-PAP, although was not titrated to optimal pressure. I suspect she has nasal sinus obstruction or congestion     Social History:   reports that she has never smoked. She has never used smokeless tobacco. She reports current alcohol use. She reports that she does not use drugs.   Family History:  Her family history is negative for Breast cancer and Colon cancer.   Allergies No Known Allergies   Home Medications  Prior to Admission medications   Medication Sig Start Date End Date Taking? Authorizing  Provider  amLODipine (NORVASC) 10 MG tablet TAKE 1 TABLET(10 MG) BY MOUTH DAILY 07/04/23  Yes Malva Limes, MD  celecoxib (CELEBREX) 200 MG capsule TAKE 1 CAPSULE BY MOUTH TWICE DAILY 07/06/23  Yes Malva Limes, MD  diclofenac sodium (VOLTAREN) 1 % GEL Apply 4 g topically 4 (four) times daily as needed. 07/25/18  Yes Malva Limes, MD  fluticasone (FLONASE) 50 MCG/ACT nasal spray Place 2 sprays into both nostrils daily as needed. 10/13/14  Yes [provider]  lisinopril-hydrochlorothiazide (ZESTORETIC) 10-12.5 MG tablet TAKE 1 TABLET BY MOUTH DAILY 10/25/23  Yes Malva Limes, MD  Melatonin 10 MG TABS Take 10 mg by mouth at bedtime as needed.   Yes [provider]  metFORMIN (GLUCOPHAGE) 500 MG tablet TAKE 1 TABLET(500 MG) BY MOUTH THREE TIMES DAILY WITH MEALS 02/18/23  Yes Malva Limes, MD  omeprazole (PRILOSEC) 20 MG capsule TAKE 1 CAPSULE(20 MG) BY MOUTH DAILY 06/06/23  Yes Malva Limes, MD  oxyCODONE-acetaminophen (PERCOCET) 7.5-325 MG tablet Take 2 tablets by mouth every 6 (six) hours as needed for severe pain (pain score 7-10). 12/12/23  Yes Malva Limes, MD  sertraline (ZOLOFT) 50 MG tablet TAKE 1 TABLET(50 MG) BY MOUTH DAILY 07/04/23  Yes Malva Limes, MD  tirzepatide [facility]) 10 MG/0.5ML Pen Inject 10 mg into the skin once a week. 06/24/23  Yes Malva Limes, MD  fluconazole (DIFLUCAN) 100 MG tablet Take 1 tablet (100 mg total) by mouth daily. Take after completing penicillin Patient not taking: Reported on 12/18/2023 09/23/23   Sherlyn Hay, DO  penicillin v potassium (VEETID) 500 MG tablet Take 1 tablet (500 mg total) by mouth 3 (three) times daily. Patient not taking: Reported on 12/18/2023 09/23/23   Sherlyn Hay, DO  promethazine (PHENERGAN) 25 MG tablet Take 1 tablet (25 mg total) by mouth every 8 (eight) hours as needed for nausea or vomiting. Patient not taking: Reported on 12/18/2023 07/06/17   Malva Limes, MD     Critical  care time:  31 minutes    Raechel Chute, MD [facility] 12/20/2023 12:09 PM

## 2023-12-21 DIAGNOSIS — R1900 Intra-abdominal and pelvic swelling, mass and lump, unspecified site: Secondary | ICD-10-CM | POA: Diagnosis not present

## 2023-12-21 DIAGNOSIS — N83202 Unspecified ovarian cyst, left side: Secondary | ICD-10-CM | POA: Diagnosis not present

## 2023-12-21 DIAGNOSIS — A419 Sepsis, unspecified organism: Secondary | ICD-10-CM | POA: Diagnosis not present

## 2023-12-21 DIAGNOSIS — N390 Urinary tract infection, site not specified: Secondary | ICD-10-CM | POA: Diagnosis not present

## 2023-12-21 LAB — CULTURE, BLOOD (ROUTINE X 2)

## 2023-12-21 LAB — URINE CULTURE: Culture: >=100000 — AB

## 2023-12-21 LAB — CBC
HCT: 24 % — ABNORMAL LOW (ref 36.0–46.0)
HCT: 27.2 % — ABNORMAL LOW (ref 36.0–46.0)
Hemoglobin: 7.8 g/dL — ABNORMAL LOW (ref 12.0–15.0)
Hemoglobin: 8.8 g/dL — ABNORMAL LOW (ref 12.0–15.0)
MCH: 25 pg — ABNORMAL LOW (ref 26.0–34.0)
MCH: 24.8 pg — ABNORMAL LOW (ref 26.0–34.0)
MCHC: 32.5 g/dL (ref 30.0–36.0)
MCHC: 32.4 g/dL (ref 30.0–36.0)
MCV: 76.9 fL — ABNORMAL LOW (ref 80.0–100.0)
MCV: 76.6 fL — ABNORMAL LOW (ref 80.0–100.0)
Platelets: 431 10*3/uL — ABNORMAL HIGH (ref 150–400)
Platelets: 470 10*3/uL — ABNORMAL HIGH (ref 150–400)
RBC: 3.12 MIL/uL — ABNORMAL LOW (ref 3.87–5.11)
RBC: 3.55 MIL/uL — ABNORMAL LOW (ref 3.87–5.11)
RDW: 16.2 % — ABNORMAL HIGH (ref 11.5–15.5)
RDW: 16.4 % — ABNORMAL HIGH (ref 11.5–15.5)
WBC: 15.1 10*3/uL — ABNORMAL HIGH (ref 4.0–10.5)
WBC: 13.2 10*3/uL — ABNORMAL HIGH (ref 4.0–10.5)
nRBC: 0 % (ref 0.0–0.2)
nRBC: 0 % (ref 0.0–0.2)

## 2023-12-21 LAB — BASIC METABOLIC PANEL
Anion gap: 10 (ref 5–15)
BUN: 23 mg/dL — ABNORMAL HIGH (ref 6–20)
CO2: 23 mmol/L (ref 22–32)
Calcium: 8.4 mg/dL — ABNORMAL LOW (ref 8.9–10.3)
Chloride: 104 mmol/L (ref 98–111)
Creatinine, Ser: 1.12 mg/dL — ABNORMAL HIGH (ref 0.44–1.00)
GFR, Estimated: >60 mL/min (ref >60)
Glucose, Bld: 108 mg/dL — ABNORMAL HIGH (ref 70–99)
Potassium: 3.7 mmol/L (ref 3.5–5.1)
Sodium: 137 mmol/L (ref 135–145)

## 2023-12-21 LAB — PREPARE FRESH FROZEN PLASMA

## 2023-12-21 LAB — BPAM FFP
Blood Product Expiration Date: 202503092359
ISSUE DATE / TIME: 202503042148
Unit Type and Rh: 5100

## 2023-12-21 LAB — MAGNESIUM: Magnesium: 1.9 mg/dL (ref 1.7–2.4)

## 2023-12-21 LAB — PHOSPHORUS: Phosphorus: 4.3 mg/dL (ref 2.5–4.6)

## 2023-12-21 LAB — GLUCOSE, CAPILLARY
Glucose-Capillary: 106 mg/dL — ABNORMAL HIGH (ref 70–99)
Glucose-Capillary: 120 mg/dL — ABNORMAL HIGH (ref 70–99)
Glucose-Capillary: 114 mg/dL — ABNORMAL HIGH (ref 70–99)
Glucose-Capillary: 125 mg/dL — ABNORMAL HIGH (ref 70–99)
Glucose-Capillary: 107 mg/dL — ABNORMAL HIGH (ref 70–99)

## 2023-12-21 MED ORDER — CIPROFLOXACIN HCL 500 MG PO TABS
500.0000 mg | ORAL_TABLET | Freq: Two times a day (BID) | ORAL | Status: DC
  Administered 2023-12-21 – 2023-12-22 (×3): 500 mg via ORAL
  Filled 2023-12-21 (×7): qty 1

## 2023-12-21 NOTE — Plan of Care (Signed)
  Problem: Coping: Goal: Ability to adjust to condition or change in health will improve Outcome: Progressing   Problem: Fluid Volume: Goal: Ability to maintain a balanced intake and output will improve Outcome: Progressing   Problem: Metabolic: Goal: Ability to maintain appropriate glucose levels will improve Outcome: Progressing   Problem: Nutritional: Goal: Maintenance of adequate nutrition will improve Outcome: Progressing   Problem: Tissue Perfusion: Goal: Adequacy of tissue perfusion will improve Outcome: Progressing   Problem: Skin Integrity: Goal: Risk for impaired skin integrity will decrease Outcome: Not Progressing

## 2023-12-21 NOTE — Plan of Care (Signed)
   Problem: Activity: Goal: Risk for activity intolerance will decrease Outcome: Progressing   Problem: Nutrition: Goal: Adequate nutrition will be maintained Outcome: Progressing   Problem: Safety: Goal: Ability to remain free from injury will improve Outcome: Progressing

## 2023-12-21 NOTE — Consult Note (Addendum)
 Gynecologic Oncology Consult Visit   Chief Complaint: Pelvic Mass  Subjective:  Jessica Fields is a 43 y.o. G0P0 female who is currently hospitalized with urosepsis. PMH significant for iron deficiency anemia, anxiety, T2DM, endometriosis, fibroids, hypertension, hyperlipidemia.   She presented to ER on 12/09/23 for vaginal bleeding and abdominal pain. She was found to have leukocytosis, anemia with hmg 8.2, thrombocytosis. She was symptomatic with dyspareunia, daily spotting and worsening pain x 3 weeks. Decreased appetite but no bloating or early satiety, BMs normal.  12/09/23- Pelvic ultrasound  Uterus: 6.2 x 3.8 x 3.1 cm, posterior fibroid 4.9 x 4.7 x 4.0cm, non-visualization of ovaries, and 9.3 x 9.1 x 8.7 cm mildly complex cystic lesion posterior midline pelvis.   MRI  1. At the posterior aspect of the uterine fundus and distorting the contour of the uterus, there is a large, septated lesion measuring 10.6 x 10.2 x 9.2 cm. This demonstrates heterogeneous internal T1 and T2 intermediate and does not appear to enhance on multiphasic sequences. Given intimate relationship to the uterus this may reflect a large degenerated fibroid, or alternately a large hemorrhagic left ovarian cyst or endometrioma. Cystic ovarian malignancy not generally excluded but not favored.  2. Normal left ovarian tissue is not clearly visualized.  3. Normal right ovary with a small, benign, functional cyst requiring no specific further follow-up or characterization.  4. IUD in the endometrial cavity. Non degenerated uterine fibroids which distort and almost completely efface endometrial cavity.   She was seen by Dr Erma Pinto Schermerhorn at [facility] on 12/13/23. Pelvic exam limited d/t comfort and body habitus.   CA 125 - 27.6 CEA- < 0.6 CA 19-9- 19 HE4- 166 Pre-menopausal ROMA: 59.27% (high risk) HIV- non-reactive.   Gyn onc was consulted for outpatient evaluation however, before patient could be seen, she  presented to [facility] ER with fever 102.9, chills, fatigue on 12/18/23, O2 92%, hypotensive, tachycardic, leukocytosis. She met criteria for urosepsis and received broad spectrum antibiotics including vanco, cefepime, flagyl. Cr 2.11. WBC 34 with bands. Hmg 7.8. Plts 599. Hypotensive despite IVF bolus and started on levophed. Hmg 8 post transfusion. Off levophed. AKI improving.   12/19/23- MR Pelvis & Abdomen W WO Contrast  Reproductive: Large cystic lesion in the pelvis has slightly increased in size, on today's examination measuring 11.7 x 11.1 cm, previously 10.5 x 10.0 cm when measured similarly (series 4, image 17). An internal septation has resolved, and this remains with homogeneous intrinsically T1 and T2 intermediate signal and is without internal contrast enhancement. As on prior examination, this is essentially inseparable from the posterior uterine fundus, and multiple uterine fibroids almost completely efface the normal endometrial cavity (series 12, image 21). No normal left ovarian tissue is visible. Normal right ovary with a small functional cyst or follicle requiring no further follow-up or  characterization. IUD in the fundal endometrial cavity. 1. Large cystic lesion in the pelvis has slightly increased in size, on today's examination measuring 11.7 x 11.1 cm, previously 10.5 x 10.0 cm when measured similarly. An internal septation has resolved, and this remains with homogeneous intrinsically T1 and T2 intermediate signal and is without internal contrast enhancement. Given behavior over the short interval and patient demographic, a large hemorrhagic ovarian cyst or alternately an endometrioma strongly favored. 2. No acute findings in the abdomen. Specifically no evidence of free fluid in the abdomen or pelvis to suggest cyst rupture.  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 Echo pending. Stabilization and transfer to tertiary care center was recommended.   Of note, [facility] 09/15/16 for pelvic pain with cycle. Mirena IUD inserted at that time with improvement in pain and cycles.  10/06/16- [place] Endovaginal: 4.7 cm complex left ovarian cyst, hemorrhagic cyst vs endometrioma. 3.3 cm simple right ovarian cyst. Uterus 8.36 x 3.51 x 2.68. RO: 4.06 x 3.51 x 2.68. LO: 4.32 x 5.12. Endometrium: 1.08 cm. Malpositioned IUD noted within lower uterine segment.   10/07/16- Dr Carles Collet at [facility]: Mirena in lower uterine segment. Nothing to do if patient is having reduced bleeding and not having significant pain.  [place] does show persistent likely endometrioma. Would still not operate at this time if we can avoid it due to her level of obesity and surgical risk.  She has not had follow up imaging until 2025.   She reports intentional weight loss of > 100 pounds. She has been on GLP1. Reports minimal vaginal bleeding over past 2 weeks but has had increased bleeding and cramping over past few months. Increased dyspareunia x 2 months with sensation of vaginal dryness. IUD in place. She does not desire fertility. Reports increased urinary urgency, worse when standing.   She has family history of leukemia in maternal grandfather and maternal 1st cousin diagnosed with colon cancer. She does not smoke and rarely drinks alcohol.   Problem List: Patient Active Problem List   Diagnosis Date Noted   Sepsis due to urinary tract infection (HCC) 12/18/2023   COVID-19 11/02/2022   Albuminuria 03/16/2022   Primary hypertension 03/16/2022   Chronic, continuous use of opioids 08/19/2020   Chronic cholecystitis 08/19/2020   Type 2 diabetes mellitus with morbid obesity (HCC) 01/28/2020   Allergic rhinitis 07/24/2015   GERD (gastroesophageal reflux disease) 07/24/2015   Bilateral knee pain 07/24/2015   Migraine 07/24/2015   Morbid obesity (HCC) 07/24/2015    History of cholecystitis 07/24/2015   Obstructive sleep apnea 01/25/2005   Past Medical History: Past Medical History:  Diagnosis Date   History of chicken pox    History of cholecystitis    Past Surgical History: Past Surgical History:  Procedure Laterality Date   Sleep Study  01/25/2005   Performed at [facility] by Dr. Ysidro Evert. Interpretation: very severe Sleep Apnea with nocturnal desaturations. She was improved with nasal Bi-PAP, although was not titrated to optimal pressure. I suspect she has nasal sinus obstruction or congestion   Past Gynecologic History:  Menarch: age [age] Pap: denies abnormal paps. Never had excisional procedures STI: denies Sexually active: yes Contraception: Mirena IUD Fertility: does not desire fertility HPV: unsure Denies abdominal surgeries Denies history of OCP/HRT  OB History:  OB History  Gravida Para Term Preterm AB Living  0 0 0 0 0 0  SAB IAB Ectopic Multiple Live Births  0 0 0 0   Obstetric Comments  1st Menstrual Cycle:  10   Family History: Family History  Problem Relation Age of Onset   Breast cancer Neg Hx    Colon cancer Neg Hx    Social History: Social History   Socioeconomic History   Marital status: Married    Spouse name: Not on file   Number of children: 0   Years of education: Not on file   Highest education level: Not on file  Occupational History   Occupation: Transport planner  Tobacco Use   Smoking status:  Never   Smokeless tobacco: Never  Substance and Sexual Activity   Alcohol use: Yes    Alcohol/week: 0.0 standard drinks of alcohol    Comment: occasional use   Drug use: No   Sexual activity: Not on file  Other Topics Concern   Not on file  Social History Narrative   Not on file   Social Drivers of Health   Financial Resource Strain: Low Risk  (12/13/2023)   Received from [facility]   Overall Financial Resource Strain (CARDIA)    Difficulty of Paying Living Expenses: Not  very hard  Food Insecurity: No Food Insecurity (12/18/2023)   Hunger Vital Sign    Worried About Running Out of Food in the Last Year: Never true    Ran Out of Food in the Last Year: Never true  Transportation Needs: No Transportation Needs (12/18/2023)   PRAPARE - Administrator, Civil Service (Medical): No    Lack of Transportation (Non-Medical): No  Physical Activity: Not on file  Stress: Not on file  Social Connections: Not on file  Intimate Partner Violence: Not At Risk (12/18/2023)   Humiliation, Afraid, Rape, and Kick questionnaire    Fear of Current or Ex-Partner: No    Emotionally Abused: No    Physically Abused: No    Sexually Abused: No   Allergies: No Known Allergies  Current Medications: Current Facility-Administered Medications  Medication Dose Route Frequency Provider Last Rate Last Admin   0.9 %  sodium chloride infusion (Manually program via Guardrails IV Fluids)   Intravenous Once Raechel Chute, MD       0.9 %  sodium chloride infusion (Manually program via Guardrails IV Fluids)   Intravenous Once Raechel Chute, MD       acetaminophen (TYLENOL) tablet 650 mg  650 mg Oral Q6H PRN Judithe Modest, NP   650 mg at 12/20/23 0112   Chlorhexidine Gluconate Cloth 2 % PADS 6 each  6 each Topical Daily Raechel Chute, MD   6 each at 12/20/23 0854   diphenhydrAMINE (BENADRYL) capsule 50 mg  50 mg Oral Q6H PRN Rust-Chester, Cecelia Byars, NP   50 mg at 12/20/23 2341   docusate sodium (COLACE) capsule 100 mg  100 mg Oral BID PRN Jimmye Norman, NP       feeding supplement (ENSURE ENLIVE / ENSURE PLUS) liquid 237 mL  237 mL Oral BID BM Dgayli, Khabib, MD   237 mL at 12/20/23 0853   insulin aspart (novoLOG) injection 0-20 Units  0-20 Units Subcutaneous TID WC Jimmye Norman, NP   3 Units at 12/20/23 1156   insulin aspart (novoLOG) injection 0-5 Units  0-5 Units Subcutaneous QHS Jimmye Norman, NP       melatonin tablet 10 mg  10 mg Oral QHS PRN  Jimmye Norman, NP   10 mg at 12/19/23 2116   oxyCODONE-acetaminophen (PERCOCET) 7.5-325 MG per tablet 2 tablet  2 tablet Oral Q6H PRN Jimmye Norman, NP   2 tablet at 12/21/23 0542   pantoprazole (PROTONIX) EC tablet 40 mg  40 mg Oral Daily Jimmye Norman, NP   40 mg at 12/21/23 0842   piperacillin-tazobactam (ZOSYN) IVPB 3.375 g  3.375 g Intravenous Q8H Dgayli, Khabib, MD 12.5 mL/hr at 12/21/23 0844 3.375 g at 12/21/23 0844   polyethylene glycol (MIRALAX / GLYCOLAX) packet 17 g  17 g Oral Daily PRN Jimmye Norman, NP       sertraline (ZOLOFT)  tablet 50 mg  50 mg Oral Daily Jimmye Norman, NP   50 mg at 12/21/23 1610    Review of Systems General: negative for fevers, changes in weight or night sweats. Positive for weight loss Skin: negative for changes in moles or sores or rash Eyes: negative for changes in vision HEENT: negative for change in hearing, tinnitus, voice changes Pulmonary: negative for productive cough, wheezing Cardiac: negative for palpitations, pain Gastrointestinal: negative for nausea, vomiting, constipation, diarrhea, hematemesis, hematochezia Genitourinary/Sexual: negative for hematuria. Positive for urgency.  Ob/Gyn:  positive for pain and bleeding Musculoskeletal: negative for pain, joint pain, back pain Hematology: negative for easy bruising, abnormal bleeding Neurologic/Psych: negative for headaches, seizures, paralysis, weakness, numbness  Objective:  Physical Examination:  BP (!) 143/67 (BP Location: Left Arm)   Pulse 98   Temp 98.3 F (36.8 C)   Resp 18   Ht 5\' 6"  (1.676 m)   Wt (!) 352 lb 11.8 oz (160 kg)   SpO2 93%   BMI 56.93 kg/m     GENERAL: Fatigued appearing. Obese. In hospital bed. No acute distress. Husband at bedside.  HEENT:  Sclerae anicteric.  Oropharynx clear and moist. No ulcerations or evidence of oropharyngeal candidiasis. Neck is supple.  NODES:  No cervical, supraclavicular, or axillary  lymphadenopathy palpated.  LUNGS:  Clear to auscultation bilaterally.  No wheezes or rhonchi. HEART:  Regular rate and rhythm. No murmur appreciated. ABDOMEN:  Soft, nontender.  Positive, normoactive bowel sounds. No organomegaly palpated however, exam limited d/t habitus. Urinary pressure with palpation of bladder. Tenderness of left lower abdomen with palpation.  MSK:  No focal spinal tenderness to palpation. Full range of motion bilaterally in the upper extremities. SKIN:  scattered erythematous rash of forearms NEURO:  Nonfocal. Well oriented.  Appropriate affect.  Pelvic: deferred  Lab Review    Latest Ref Rng & Units 12/21/2023    9:48 AM 12/21/2023    1:45 AM 12/20/2023    5:34 PM  CBC  WBC 4.0 - 10.5 K/uL 13.2  15.1  18.1   Hemoglobin 12.0 - 15.0 g/dL 8.8  7.8  8.5   Hematocrit 36.0 - 46.0 % 27.2  24.0  26.6   Platelets 150 - 400 K/uL 470  431  488    Urinalysis    Component Value Date/Time   COLORURINE YELLOW (A) 12/18/2023 1842   APPEARANCEUR TURBID (A) 12/18/2023 1842   APPEARANCEUR Clear 02/08/2015 2325   LABSPEC 1.014 12/18/2023 1842   LABSPEC 1.019 02/08/2015 2325   PHURINE 5.0 12/18/2023 1842   GLUCOSEU NEGATIVE 12/18/2023 1842   GLUCOSEU Negative 02/08/2015 2325   HGBUR LARGE (A) 12/18/2023 1842   BILIRUBINUR NEGATIVE 12/18/2023 1842   BILIRUBINUR Negative 02/08/2015 2325   KETONESUR NEGATIVE 12/18/2023 1842   PROTEINUR 100 (A) 12/18/2023 1842   UROBILINOGEN 0.2 03/09/2011 2159   NITRITE NEGATIVE 12/18/2023 1842   LEUKOCYTESUR MODERATE (A) 12/18/2023 1842   LEUKOCYTESUR Negative 02/08/2015 2325    Radiologic Imaging: 12/19/23- MR Pelvis W WO Contrast Narrative & Impression  CLINICAL DATA:  Adnexal mass, pelvic cystic lesion, low hemoglobin, concern for ruptured cyst   EXAM: MRI ABDOMEN AND PELVIS WITHOUT AND WITH CONTRAST   TECHNIQUE: Multiplanar multisequence MR imaging of the abdomen and pelvis was performed both before and after the administration of  intravenous contrast.   CONTRAST:  10mL GADAVIST GADOBUTROL 1 MMOL/ML IV SOLN   COMPARISON:  12/09/2023   FINDINGS: COMBINED FINDINGS FOR BOTH MR ABDOMEN AND PELVIS   Lower  chest: No acute abnormality.   Hepatobiliary: No focal liver abnormality is seen. Hepatomegaly, maximum coronal span 22.6 cm. Mild hepatic steatosis. Status post cholecystectomy. No biliary dilatation.   Pancreas: Unremarkable. No pancreatic ductal dilatation or surrounding inflammatory changes.   Spleen: Splenomegaly, maximum span 14.7 cm.   Adrenals/Urinary Tract: Adrenal glands are unremarkable. Kidneys are normal, without renal calculi, solid lesion, or hydronephrosis. Bladder is unremarkable.   Stomach/Bowel: Stomach is within normal limits. Appendix appears normal. No evidence of bowel wall thickening, distention, or inflammatory changes.   Vascular/Lymphatic: No significant vascular findings are present. No enlarged abdominal or pelvic lymph nodes.   Reproductive: Large cystic lesion in the pelvis has slightly increased in size, on today's examination measuring 11.7 x 11.1 cm, previously 10.5 x 10.0 cm when measured similarly (series 4, image 17). An internal septation has resolved, and this remains with homogeneous intrinsically T1 and T2 intermediate signal and is without internal contrast enhancement. As on prior examination, this is essentially inseparable from the posterior uterine fundus, and multiple uterine fibroids almost completely efface the normal endometrial cavity (series 12, image 21). No normal left ovarian tissue is visible. Normal right ovary with a small functional cyst or follicle requiring no further follow-up or characterization. IUD in the fundal endometrial cavity.   Other: No abdominal wall hernia or abnormality. No ascites.   Musculoskeletal: No acute or significant osseous findings.   IMPRESSION: 1. Large cystic lesion in the pelvis has slightly increased in  size, on today's examination measuring 11.7 x 11.1 cm, previously 10.5 x 10.0 cm when measured similarly. An internal septation has resolved, and this remains with homogeneous intrinsically T1 and T2 intermediate signal and is without internal contrast enhancement. Given behavior over the short interval and patient demographic, a large hemorrhagic ovarian cyst or alternately an endometrioma strongly favored. 2. No acute findings in the abdomen. Specifically no evidence of free fluid in the abdomen or pelvis to suggest cyst rupture.     Electronically Signed   By: Jearld Lesch M.D.   On: 12/19/2023 07:03   12/09/23- MR Pelvis W WO Contrast Narrative & Impression  CLINICAL DATA:  Adnexal mass, malignancy suspected, cystic lesion of the pelvis identified by prior ultrasound   EXAM: MRI ABDOMEN AND PELVIS WITHOUT AND WITH CONTRAST   TECHNIQUE: Multiplanar multisequence MR imaging of the abdomen and pelvis was performed both before and after the administration of intravenous contrast.   CONTRAST:  10mL GADAVIST GADOBUTROL 1 MMOL/ML IV SOLN   COMPARISON:  None Available.   FINDINGS: COMBINED FINDINGS FOR BOTH MR ABDOMEN AND PELVIS   Lower chest: No acute abnormality.   Hepatobiliary: No solid liver abnormality is seen. Hepatomegaly, maximum coronal span 21.9 cm. Status post cholecystectomy. Postoperative biliary ductal dilatation   Pancreas: Unremarkable. No pancreatic ductal dilatation or surrounding inflammatory changes.   Spleen: Splenomegaly, maximum span 14.6 cm.   Adrenals/Urinary Tract: Adrenal glands are unremarkable. Kidneys are normal, without renal calculi, solid lesion, or hydronephrosis. Bladder is unremarkable.   Stomach/Bowel: Stomach is within normal limits. Appendix appears normal. No evidence of bowel wall thickening, distention, or inflammatory changes.   Vascular/Lymphatic: No significant vascular findings are present. No enlarged abdominal or  pelvic lymph nodes.   Reproductive: IUD in the endometrial cavity. Contrast enhancing fibroids which distort and almost completely efface endometrial cavity. At the posterior aspect of the uterine fundus and distorting the contour of the uterus, there is a large, septated lesion measuring 10.6 x 10.2 x 9.2 cm (series 24,  image 15, series 26, image 18). This demonstrates heterogeneous internal T1 and T2 intermediate signal and by manual measurements (subtraction images not provided for review), this does not appear to enhance on multiphasic sequences. Normal left ovarian tissue is not clearly visualized. Normal right ovary with a small, benign, functional cysts requiring no specific further follow-up or characterization.   Other: No abdominal wall hernia or abnormality. No ascites.   Musculoskeletal: No acute or significant osseous findings.   IMPRESSION: 1. At the posterior aspect of the uterine fundus and distorting the contour of the uterus, there is a large, septated lesion measuring 10.6 x 10.2 x 9.2 cm. This demonstrates heterogeneous internal T1 and T2 intermediate and does not appear to enhance on multiphasic sequences. Given intimate relationship to the uterus this may reflect a large degenerated fibroid, or alternately a large hemorrhagic left ovarian cyst or endometrioma. Cystic ovarian malignancy not generally excluded but not favored. 2. Normal left ovarian tissue is not clearly visualized. 3. Normal right ovary with a small, benign, functional cyst requiring no specific further follow-up or characterization. 4. IUD in the endometrial cavity. Non degenerated uterine fibroids which distort and almost completely efface endometrial cavity.     Electronically Signed   By: Jearld Lesch M.D.   On: 12/09/2023 15:50   12/09/23- US Pelvic complete Narrative & Impression  CLINICAL DATA:  Lower abdominal pain, vaginal bleeding for 10 days.   EXAM: TRANSABDOMINAL AND  TRANSVAGINAL ULTRASOUND OF PELVIS   DOPPLER ULTRASOUND OF OVARIES   TECHNIQUE: Both transabdominal and transvaginal ultrasound examinations of the pelvis were performed. Transabdominal technique was performed for global imaging of the pelvis including uterus, ovaries, adnexal regions, and pelvic cul-de-sac.   It was necessary to proceed with endovaginal exam following the transabdominal exam to visualize the endometrium and ovaries. Color and duplex Doppler ultrasound was utilized to evaluate blood flow to the ovaries.   COMPARISON:  March 24, 2009.   FINDINGS: Uterus   Measurements: 6.2 x 3.8 x 3.1 cm = volume: 38 mL. Probable fibroid measuring 4.9 x 4.7 x 4.0 cm is noted posteriorly.   Endometrium   Thickness: 5 mm which is within normal limits. No focal abnormality visualized.   Right ovary   Not clearly visualized.   Left ovary   Not clearly visualized.   Pulsed Doppler evaluation of cystic mass posteriorly and pelvis demonstrates normal low-resistance arterial and venous waveforms.   Other findings   9.3 x 9.1 x 8.7 cm mildly complex cystic lesion is noted posteriorly in the midline of the pelvis. This may be ovarian in etiology, but it cannot be determined from which ovary it arises, if any. Potentially may represent endometrioma.   IMPRESSION: Ovaries are not clearly visualized and therefore ovarian torsion cannot be excluded on the basis of this exam.   9.3 x 9.1 x 8.7 cm mildly complex cystic lesion is noted posteriorly in the midline of the pelvis. This may be ovarian etiology, but it cannot be determined from which ovary it arises, if any. Potentially this may represent endometrioma. MRI is recommended for further evaluation.   Probable 4.9 cm fibroid noted posteriorly and uterus.   Electronically Signed   By: Lupita Raider M.D.   On: 12/09/2023 13:07   Assessment:  Wladyslawa JESSILYNN TAFT is a 43 y.o. female with enlarging pelvic mass present since  2017, now 11.7 cm. No evidence of torsion or cystic rupture. Biopsy not obtained d/t discomfort. Elevated HE4 at 166. ROMA 59%. Currently hospitalized  for urosepsis.   Medical co-morbidities complicating care: BMI 57, Severe OSA, hypertension, hyperlipidemia, anemia Plan:   Problem List Items Addressed This Visit   None Visit Diagnoses       Septic shock (HCC)    -  Primary   Relevant Medications   ceFEPIme (MAXIPIME) 2 g in sodium chloride 0.9 % 100 mL IVPB (Completed)   metroNIDAZOLE (FLAGYL) IVPB 500 mg (Completed)   vancomycin (VANCOCIN) IVPB 1000 mg/200 mL premix (Completed)      We reviewed findings, imaging today. Mass present since at least 2017 upon chart review, and at that time it measured ~4 cm. Now measuring 11.7 x 11.1 cm. Suspect this arises from left ovary. We reviewed options for management including potential for malignancy however, less likely given presence for several years. We reviewed that she is high risk for surgery given her comorbidities but once she is discharged from hospital for acute condition, we can see her at [facility], optimize her medically, and address the mass surgically. Options could include mass removal, unilateral salpingo-oophorectomy vs controlled drainage and removal. We will have her meet with anesthesia to help guide surgical planning.   Recommend continuing antibiotic therapy. Plan to see her at [facility] in 2-3 weeks for surgical planning.   Consuello Masse, DNP, AGNP-C, AOCNP [facility] [phone] (clinic)  I personally had a face to face interaction and evaluated the patient jointly with the NP, Ms. Consuello Masse.  I have reviewed her history and available records and have performed the key portions of the physical exam including HEENT, back and abdominal exam with my findings confirming those documented above by the APP.  I have discussed the case with the APP and the patient.  I agree with the above documentation, assessment  and plan which was fully formulated by me.  Counseling was completed by me.   I personally saw the patient and performed a substantive portion of this encounter in conjunction with the listed APP as documented above.  Jessica Aguado Leta Jungling, MD

## 2023-12-21 NOTE — Progress Notes (Addendum)
 PROGRESS NOTE    RAKEYA GLAB  YSA:[id number] DOB: 1981-10-12 DOA: 12/18/2023 PCP: Malva Limes, MD  Outpatient Specialists: ob/gyn    Brief Narrative:   From ICU documentation  43 y.o female with significant PMH of HTN, T2DM, HLD, Obesity, OSA, chronic cystitis, IDA, anxiety and depression, endometriosis who presented to the ED with chief complaints of abdominal pain, fevers, chills, fatigue with associated urinary symptoms since last night.  Patient also endorses mild shortness of breath which she is attributing to her IDA, received iron infusion 2 days ago.   ED Course: Initial vital signs showed HR of 140 beats/minute, BP 88/108mm Hg, the RR 33 breaths/minute, and the oxygen saturation 92% on RA and a temperature of 102.1F (39.4C). Pertinent Labs/Diagnostics Findings: Na+/ K+:133/4.1 Glucose: 165 BUN/Cr.:38/2.11  WBC: 34.3K/L with bands or neutrophil predominance Hgb/Hct: 7.8/25.6 Plts:599  PCT: 2.12 Lactic acid: 2.6 COVID PCR: Negative,  CXR> negative Patient given 30 cc/kg of fluids and started on broad-spectrum antibiotics Vanco cefepime and Flagyl for sepsis due to suspected UTI.  Patient remained hypotensive despite IVF boluses therefore was started on Levophed.   PCCM consulted.  12/18/2023: admit to ICU, on vasopressors 12/19/2023: weaning nor-epi, received 1 unit PRBC 12/20/2023: drifting hemoglobin, received another unit of PRBC's, off pressors overnight  Assessment & Plan:   Principal Problem:   Sepsis due to urinary tract infection (HCC) Active Problems:   Morbid obesity (HCC)   Obstructive sleep apnea   Type 2 diabetes mellitus with morbid obesity (HCC)   Primary hypertension  # Complicated UTI # Sepsis, severe Initially required vasopressors, now weaned off and hemodynamically stable. Pan-sensitive e coli on culture. uti symptoms (urgency, fever) resolved. New rash today - will avoid penicillins given new rash. Will switch to cipro  # Pelvic mass MRI  with large cystic lesion in pelvis, patient recently established with og/gyn for this, plan was eventual surgery. Mass has grown slightly, favored by radiology to represent a large hemorrhagic ovarian cyst or endometrioma - ob/gyn consult pending  # Rash New macular rash on arms today, slightly pruritic, no respiratory symptoms. Denies history of this rash. - stop zosyn - monitor  # Staph hominis bacteremia In one set of blood cultures, likely contaminant  # Iron deficiency anemia Apparently secondary to AUB, denies other bleeding or melena. Hgb 7s on arrival, 8s today after total of 3 units (most recently on 3/4). No free fluid in abdomen. No current vaginal bleeding, says last bleed was one week ago - monitor   # GAD - home zoloft  # AKI Resolved w/ fluids - monitor off fluids  # HTN Bp wnl currently - home amlodipine, lisinopril/hydrochlorothiazide on hold  # T2DM Euglycemic - continue ssi  # Morbid obesity Noted  # OSA - cpap qhs   DVT prophylaxis: scds Code Status: full Family Communication: husband updated @ bedside 3/5  Level of care: Progressive Status is: Inpatient Remains inpatient appropriate because: severity of illness    Consultants:  Ob/gyn  Procedures: None thus far  Antimicrobials:  Zosyn>cipro    Subjective: Reports urinary frequency resolved, loose but not watery BM earlier today no melena, no vaginal bleeding, ongoing pelvic discomfort, tolerating food  Objective: Vitals:   12/20/23 2330 12/21/23 0355 12/21/23 0500 12/21/23 0759  BP: 128/66 122/64  (!) 143/67  Pulse: 94 90  98  Resp: (!) 26 20  18   Temp: 99.5 F (37.5 C) 98.6 F (37 C)  98.3 F (36.8 C)  TempSrc: Oral Oral  SpO2: 94% 100%  93%  Weight:   (!) 160 kg   Height:        Intake/Output Summary (Last 24 hours) at 12/21/2023 1151 Last data filed at 12/21/2023 1005 Gross per 24 hour  Intake 1380.03 ml  Output --  Net 1380.03 ml   Filed Weights   12/19/23 0416  12/20/23 0500 12/21/23 0500  Weight: (!) 157.8 kg (!) 159.9 kg (!) 160 kg    Examination:  General exam: Appears calm and comfortable  Respiratory system: Clear to auscultation. Respiratory effort normal. Cardiovascular system: S1 & S2 heard, RRR. No JVD, murmurs, rubs, gallops or clicks. No pedal edema. Gastrointestinal system: Abdomen is nondistended, obese, tenderness midline inferiorally Central nervous system: Alert and oriented. No focal neurological deficits. Extremities: Symmetric 5 x 5 power. Skin: No rashes, lesions or ulcers Psychiatry: Judgement and insight appear normal. Mood & affect appropriate.     Data Reviewed: I have personally reviewed following labs and imaging studies  CBC: Recent Labs  Lab 12/18/23 1806 12/19/23 0621 12/19/23 1457 12/19/23 2038 12/20/23 0346 12/20/23 1039 12/20/23 1734 12/21/23 0145 12/21/23 0948  WBC 34.3*   < > 25.1*  --  18.8*  --  18.1* 15.1* 13.2*  NEUTROABS 29.9*  --  21.8*  --   --   --   --   --   --   HGB 7.8*   < > 8.0*   < > 6.7* 7.9* 8.5* 7.8* 8.8*  HCT 25.6*   < > 25.3*   < > 21.0* 24.9* 26.6* 24.0* 27.2*  MCV 77.6*   < > 76.4*  --  74.7*  --  77.8* 76.9* 76.6*  PLT 599*   < > 491*  --  459*  --  488* 431* 470*   < > = values in this interval not displayed.   Basic Metabolic Panel: Recent Labs  Lab 12/18/23 1842 12/19/23 0331 12/20/23 0346 12/21/23 0541  NA 133* 132* 136 137  K 4.1 3.9 3.8 3.7  CL 99 102 103 104  CO2 21* 20* 23 23  GLUCOSE 165* 130* 117* 108*  BUN 38* 33* 32* 23*  CREATININE 2.11* 1.70* 1.37* 1.12*  CALCIUM 8.4* 7.6* 8.0* 8.4*  MG  --   --   --  1.9  PHOS  --   --   --  4.3   GFR: Estimated Creatinine Clearance: 100.8 mL/min (A) (by C-G formula based on SCr of 1.12 mg/dL (H)). Liver Function Tests: Recent Labs  Lab 12/18/23 1842  AST 19  ALT 11  ALKPHOS 90  BILITOT 0.5  PROT 7.8  ALBUMIN 2.3*   No results for input(s): "LIPASE", "AMYLASE" in the last 168 hours. No results for  input(s): "AMMONIA" in the last 168 hours. Coagulation Profile: Recent Labs  Lab 12/18/23 1806  INR 1.4*   Cardiac Enzymes: No results for input(s): "CKTOTAL", "CKMB", "CKMBINDEX", "TROPONINI" in the last 168 hours. BNP (last 3 results) No results for input(s): "PROBNP" in the last 8760 hours. HbA1C: Recent Labs    12/18/23 1806  HGBA1C 6.3*   CBG: Recent Labs  Lab 12/20/23 0755 12/20/23 1144 12/20/23 1721 12/20/23 2118 12/21/23 0804  GLUCAP 105* 135* 100* 121* 106*   Lipid Profile: No results for input(s): "CHOL", "HDL", "LDLCALC", "TRIG", "CHOLHDL", "LDLDIRECT" in the last 72 hours. Thyroid Function Tests: No results for input(s): "TSH", "T4TOTAL", "FREET4", "T3FREE", "THYROIDAB" in the last 72 hours. Anemia Panel: No results for input(s): "VITAMINB12", "FOLATE", "FERRITIN", "TIBC", "IRON", "RETICCTPCT"  in the last 72 hours. Urine analysis:    Component Value Date/Time   COLORURINE YELLOW (A) 12/18/2023 1842   APPEARANCEUR TURBID (A) 12/18/2023 1842   APPEARANCEUR Clear 02/08/2015 2325   LABSPEC 1.014 12/18/2023 1842   LABSPEC 1.019 02/08/2015 2325   PHURINE 5.0 12/18/2023 1842   GLUCOSEU NEGATIVE 12/18/2023 1842   GLUCOSEU Negative 02/08/2015 2325   HGBUR LARGE (A) 12/18/2023 1842   BILIRUBINUR NEGATIVE 12/18/2023 1842   BILIRUBINUR Negative 02/08/2015 2325   KETONESUR NEGATIVE 12/18/2023 1842   PROTEINUR 100 (A) 12/18/2023 1842   UROBILINOGEN 0.2 03/09/2011 2159   NITRITE NEGATIVE 12/18/2023 1842   LEUKOCYTESUR MODERATE (A) 12/18/2023 1842   LEUKOCYTESUR Negative 02/08/2015 2325   Sepsis Labs: @LABRCNTIP (procalcitonin:4,lacticidven:4)  ) Recent Results (from the past 240 hours)  Culture, blood (Routine x 2)     Status: None (Preliminary result)   Collection Time: 12/18/23  6:10 PM   Specimen: BLOOD  Result Value Ref Range Status   Specimen Description BLOOD RIGHT ANTECUBITAL  Final   Special Requests   Final    BOTTLES DRAWN AEROBIC AND ANAEROBIC  Blood Culture results may not be optimal due to an inadequate volume of blood received in culture bottles   Culture   Final    NO GROWTH 3 DAYS Performed at [facility], [address], [place], [place] [address]    Report Status PENDING  Incomplete  Culture, blood (Routine x 2)     Status: Abnormal   Collection Time: 12/18/23  6:42 PM   Specimen: BLOOD LEFT ARM  Result Value Ref Range Status   Specimen Description   Final    BLOOD LEFT ARM Performed at [facility], [address] [place], [place] [address]    Special Requests   Final    BOTTLES DRAWN AEROBIC AND ANAEROBIC Blood Culture results may not be optimal due to an inadequate volume of blood received in culture bottles Performed at [facility], [address] [place], [place] [address]    Culture  Setup Time   Final    GRAM POSITIVE COCCI ANAEROBIC BOTTLE ONLY Organism ID to follow CRITICAL RESULT CALLED TO, READ BACK BY AND VERIFIED WITH: AMANDA CHOI 1810 12/19/23 MU    Culture (A)  Final    STAPHYLOCOCCUS HOMINIS THE SIGNIFICANCE OF ISOLATING THIS ORGANISM FROM A SINGLE SET OF BLOOD CULTURES WHEN MULTIPLE SETS ARE DRAWN IS UNCERTAIN. PLEASE NOTIFY THE MICROBIOLOGY DEPARTMENT WITHIN ONE WEEK IF SPECIATION AND SENSITIVITIES ARE REQUIRED. Performed at [facility], [address] [place], [place] [address]    Report Status 12/21/2023 FINAL  Final  Resp panel by RT-PCR (RSV, Flu A&B, Covid) Anterior Nasal Swab     Status: None   Collection Time: 12/18/23  6:42 PM   Specimen: Anterior Nasal Swab  Result Value Ref Range Status   SARS Coronavirus 2 by RT PCR NEGATIVE NEGATIVE Final    Comment: (NOTE) SARS-CoV-2 target nucleic acids are NOT DETECTED.  The SARS-CoV-2 RNA is generally detectable in upper respiratory specimens during the acute phase of infection. The lowest concentration of SARS-CoV-2 viral copies this assay can detect is 138 copies/mL. A negative result does not  preclude SARS-Cov-2 infection and should not be used as the sole basis for treatment or other patient management decisions. A negative result may occur with  improper specimen collection/handling, submission of specimen other than nasopharyngeal swab, presence of viral mutation(s) within the areas targeted by this assay, and inadequate number of  viral copies(<138 copies/mL). A negative result must be combined with clinical observations, patient history, and epidemiological information. The expected result is Negative.  Fact Sheet for Patients:  BloggerCourse.com  Fact Sheet for Healthcare Providers:  SeriousBroker.it  This test is no t yet approved or cleared by the [place] FDA and  has been authorized for detection and/or diagnosis of SARS-CoV-2 by FDA under an Emergency Use Authorization (EUA). This EUA will remain  in effect (meaning this test can be used) for the duration of the COVID-19 declaration under Section 564(b)(1) of the Act, 21 U.S.C.section 360bbb-3(b)(1), unless the authorization is terminated  or revoked sooner.       Influenza A by PCR NEGATIVE NEGATIVE Final   Influenza B by PCR NEGATIVE NEGATIVE Final    Comment: (NOTE) The Xpert Xpress SARS-CoV-2/FLU/RSV plus assay is intended as an aid in the diagnosis of influenza from Nasopharyngeal swab specimens and should not be used as a sole basis for treatment. Nasal washings and aspirates are unacceptable for Xpert Xpress SARS-CoV-2/FLU/RSV testing.  Fact Sheet for Patients: BloggerCourse.com  Fact Sheet for Healthcare Providers: SeriousBroker.it  This test is not yet approved or cleared by the [place] FDA and has been authorized for detection and/or diagnosis of SARS-CoV-2 by FDA under an Emergency Use Authorization (EUA). This EUA will remain in effect (meaning this test can be used) for the  duration of the COVID-19 declaration under Section 564(b)(1) of the Act, 21 U.S.C. section 360bbb-3(b)(1), unless the authorization is terminated or revoked.     Resp Syncytial Virus by PCR NEGATIVE NEGATIVE Final    Comment: (NOTE) Fact Sheet for Patients: BloggerCourse.com  Fact Sheet for Healthcare Providers: SeriousBroker.it  This test is not yet approved or cleared by the [place] FDA and has been authorized for detection and/or diagnosis of SARS-CoV-2 by FDA under an Emergency Use Authorization (EUA). This EUA will remain in effect (meaning this test can be used) for the duration of the COVID-19 declaration under Section 564(b)(1) of the Act, 21 U.S.C. section 360bbb-3(b)(1), unless the authorization is terminated or revoked.  Performed at [facility], [address] [place], [place] [address]   Urine Culture     Status: Abnormal   Collection Time: 12/18/23  6:42 PM   Specimen: Urine, Random  Result Value Ref Range Status   Specimen Description   Final    URINE, RANDOM Performed at [facility], [address] [place], [place] [address]    Special Requests   Final    NONE Performed at [facility], [address] [place], [place] [address]    Culture >=100,000 COLONIES/mL ESCHERICHIA COLI (A)  Final   Report Status 12/21/2023 FINAL  Final   Organism ID, Bacteria ESCHERICHIA COLI (A)  Final      Susceptibility   Escherichia coli - MIC*    AMPICILLIN <=2 SENSITIVE Sensitive     CEFAZOLIN <=4 SENSITIVE Sensitive     CEFEPIME <=0.12 SENSITIVE Sensitive     CEFTRIAXONE <=0.25 SENSITIVE Sensitive     CIPROFLOXACIN <=0.25 SENSITIVE Sensitive     GENTAMICIN <=1 SENSITIVE Sensitive     IMIPENEM <=0.25 SENSITIVE Sensitive     NITROFURANTOIN <=16 SENSITIVE Sensitive     TRIMETH/SULFA <=20 SENSITIVE Sensitive     AMPICILLIN/SULBACTAM <=2 SENSITIVE Sensitive     PIP/TAZO <=4 SENSITIVE  Sensitive ug/mL    * >=100,000 COLONIES/mL ESCHERICHIA COLI  Blood Culture ID Panel (Reflexed)     Status: Abnormal   Collection  Time: 12/18/23  6:42 PM  Result Value Ref Range Status   Enterococcus faecalis NOT DETECTED NOT DETECTED Final   Enterococcus Faecium NOT DETECTED NOT DETECTED Final   Listeria monocytogenes NOT DETECTED NOT DETECTED Final   Staphylococcus species DETECTED (A) NOT DETECTED Final    Comment: CRITICAL RESULT CALLED TO, READ BACK BY AND VERIFIED WITH: AMANDA CHOI 1810 12/19/23 MU    Staphylococcus aureus (BCID) NOT DETECTED NOT DETECTED Final   Staphylococcus epidermidis NOT DETECTED NOT DETECTED Final   Staphylococcus lugdunensis NOT DETECTED NOT DETECTED Final   Streptococcus species NOT DETECTED NOT DETECTED Final   Streptococcus agalactiae NOT DETECTED NOT DETECTED Final   Streptococcus pneumoniae NOT DETECTED NOT DETECTED Final   Streptococcus pyogenes NOT DETECTED NOT DETECTED Final   A.calcoaceticus-baumannii NOT DETECTED NOT DETECTED Final   Bacteroides fragilis NOT DETECTED NOT DETECTED Final   Enterobacterales NOT DETECTED NOT DETECTED Final   Enterobacter cloacae complex NOT DETECTED NOT DETECTED Final   Escherichia coli NOT DETECTED NOT DETECTED Final   Klebsiella aerogenes NOT DETECTED NOT DETECTED Final   Klebsiella oxytoca NOT DETECTED NOT DETECTED Final   Klebsiella pneumoniae NOT DETECTED NOT DETECTED Final   Proteus species NOT DETECTED NOT DETECTED Final   Salmonella species NOT DETECTED NOT DETECTED Final   Serratia marcescens NOT DETECTED NOT DETECTED Final   Haemophilus influenzae NOT DETECTED NOT DETECTED Final   Neisseria meningitidis NOT DETECTED NOT DETECTED Final   Pseudomonas aeruginosa NOT DETECTED NOT DETECTED Final   Stenotrophomonas maltophilia NOT DETECTED NOT DETECTED Final   Candida albicans NOT DETECTED NOT DETECTED Final   Candida auris NOT DETECTED NOT DETECTED Final   Candida glabrata NOT DETECTED NOT DETECTED Final    Candida krusei NOT DETECTED NOT DETECTED Final   Candida parapsilosis NOT DETECTED NOT DETECTED Final   Candida tropicalis NOT DETECTED NOT DETECTED Final   Cryptococcus neoformans/gattii NOT DETECTED NOT DETECTED Final    Comment: Performed at [facility], [address] [place], [place] [address]  MRSA Next Gen by PCR, Nasal     Status: None   Collection Time: 12/18/23 11:01 PM   Specimen: Nasal Mucosa; Nasal Swab  Result Value Ref Range Status   MRSA by PCR Next Gen NOT DETECTED NOT DETECTED Final    Comment: (NOTE) The GeneXpert MRSA Assay (FDA approved for NASAL specimens only), is one component of a comprehensive MRSA colonization surveillance program. It is not intended to diagnose MRSA infection nor to guide or monitor treatment for MRSA infections. Test performance is not FDA approved in patients less than 38 years old. Performed at [facility], [address], [place], [place] [address]          Radiology Studies: ECHOCARDIOGRAM COMPLETE Result Date: 12/20/2023    ECHOCARDIOGRAM REPORT   Patient Name:   ROSABELLE JUPIN Date of Exam: 12/20/2023 Medical Rec #:  [id number]    Height:       66.0 in Accession #:    [id number]   Weight:       352.5 lb Date of Birth:  1980/12/25    BSA:          2.544 m Patient Age:    42 years     BP:           107/55 mmHg Patient Gender: F            HR:           90 bpm. Exam Location:  [facility]  Procedure: 2D Echo, Cardiac Doppler, Color Doppler and Strain Analysis (Both            Spectral and Color Flow Doppler were utilized during procedure). Indications:     Murmur  History:         Patient has no prior history of Echocardiogram examinations.                  Signs/Symptoms:Murmur; Risk Factors:Hypertension, Diabetes and                  Sleep Apnea.  Sonographer:     Mikki Harbor Referring Phys:  [id number] KHABIB DGAYLI Diagnosing Phys: Yvonne Kendall MD  Sonographer Comments: Patient is obese. Global longitudinal strain was  attempted. IMPRESSIONS  1. Left ventricular ejection fraction, by estimation, is 55 to 60%. The left ventricle has normal function. The left ventricle has no regional wall motion abnormalities. There is mild left ventricular hypertrophy. Left ventricular diastolic parameters were normal. The average left ventricular global longitudinal strain is -16.4 %. The global longitudinal strain is normal.  2. Right ventricular systolic function is normal. The right ventricular size is normal. There is mildly elevated pulmonary artery systolic pressure.  3. The mitral valve is abnormal. Trivial mitral valve regurgitation.  4. The aortic valve is tricuspid. Aortic valve regurgitation is not visualized. No aortic stenosis is present.  5. The inferior vena cava is dilated in size with >50% respiratory variability, suggesting right atrial pressure of 8 mmHg. FINDINGS  Left Ventricle: Left ventricular ejection fraction, by estimation, is 55 to 60%. The left ventricle has normal function. The left ventricle has no regional wall motion abnormalities. The average left ventricular global longitudinal strain is -16.4 %. Strain was performed and the global longitudinal strain is normal. The left ventricular internal cavity size was normal in size. There is mild left ventricular hypertrophy. Left ventricular diastolic parameters were normal. Right Ventricle: The right ventricular size is normal. No increase in right ventricular wall thickness. Right ventricular systolic function is normal. There is mildly elevated pulmonary artery systolic pressure. The tricuspid regurgitant velocity is 2.81  m/s, and with an assumed right atrial pressure of 8 mmHg, the estimated right ventricular systolic pressure is 39.6 mmHg. Left Atrium: Left atrial size was normal in size. Right Atrium: Right atrial size was normal in size. Pericardium: There is no evidence of pericardial effusion. Mitral Valve: The mitral valve is abnormal. There is mild thickening  of the mitral valve leaflet(s). Trivial mitral valve regurgitation. MV peak gradient, 8.3 mmHg. The mean mitral valve gradient is 4.0 mmHg. Tricuspid Valve: The tricuspid valve is normal in structure. Tricuspid valve regurgitation is mild. Aortic Valve: The aortic valve is tricuspid. Aortic valve regurgitation is not visualized. No aortic stenosis is present. Aortic valve mean gradient measures 8.0 mmHg. Aortic valve peak gradient measures 16.6 mmHg. Aortic valve area, by VTI measures 2.28  cm. Pulmonic Valve: The pulmonic valve was not well visualized. Pulmonic valve regurgitation is not visualized. No evidence of pulmonic stenosis. Aorta: The aortic root is normal in size and structure. Pulmonary Artery: The pulmonary artery is not well seen. Venous: The inferior vena cava is dilated in size with greater than 50% respiratory variability, suggesting right atrial pressure of 8 mmHg. IAS/Shunts: The interatrial septum was not well visualized.  LEFT VENTRICLE PLAX 2D LVIDd:         4.60 cm      Diastology LVIDs:         3.30 cm  LV e' medial:    13.50 cm/s LV PW:         1.24 cm      LV E/e' medial:  10.4 LV IVS:        1.00 cm      LV e' lateral:   16.20 cm/s LVOT diam:     2.00 cm      LV E/e' lateral: 8.7 LV SV:         90 LV SV Index:   35           2D Longitudinal Strain LVOT Area:     3.14 cm     2D Strain GLS Avg:     -16.4 %  LV Volumes (MOD) LV vol d, MOD A2C: 96.5 ml LV vol d, MOD A4C: 111.0 ml LV vol s, MOD A2C: 41.6 ml LV vol s, MOD A4C: 47.1 ml LV SV MOD A2C:     54.9 ml LV SV MOD A4C:     111.0 ml LV SV MOD BP:      63.2 ml RIGHT VENTRICLE RV Basal diam:  3.90 cm RV Mid diam:    3.40 cm RV S prime:     20.80 cm/s TAPSE (M-mode): 3.2 cm LEFT ATRIUM             Index        RIGHT ATRIUM           Index LA diam:        3.80 cm 1.49 cm/m   RA Area:     19.50 cm LA Vol (A2C):   60.1 ml 23.62 ml/m  RA Volume:   62.10 ml  24.41 ml/m LA Vol (A4C):   47.1 ml 18.51 ml/m LA Biplane Vol: 54.1 ml 21.26  ml/m  AORTIC VALVE                     PULMONIC VALVE AV Area (Vmax):    2.40 cm      PV Vmax:       1.38 m/s AV Area (Vmean):   2.17 cm      PV Peak grad:  7.6 mmHg AV Area (VTI):     2.28 cm AV Vmax:           204.00 cm/s AV Vmean:          131.000 cm/s AV VTI:            0.392 m AV Peak Grad:      16.6 mmHg AV Mean Grad:      8.0 mmHg LVOT Vmax:         156.00 cm/s LVOT Vmean:        90.300 cm/s LVOT VTI:          0.285 m LVOT/AV VTI ratio: 0.73  AORTA Ao Root diam: 2.90 cm MITRAL VALVE                TRICUSPID VALVE MV Area (PHT): 4.33 cm     TR Peak grad:   31.6 mmHg MV Area VTI:   2.43 cm     TR Vmax:        281.00 cm/s MV Peak grad:  8.3 mmHg MV Mean grad:  4.0 mmHg     SHUNTS MV Vmax:       1.44 m/s     Systemic VTI:  0.29 m MV Vmean:      94.0 cm/s    Systemic Diam: 2.00  cm MV Decel Time: 175 msec MV E velocity: 141.00 cm/s MV A velocity: 114.00 cm/s MV E/A ratio:  1.24 Christopher End MD Electronically signed by Yvonne Kendall MD Signature Date/Time: 12/20/2023/9:53:49 AM    Final         Scheduled Meds:  sodium chloride   Intravenous Once   sodium chloride   Intravenous Once   Chlorhexidine Gluconate Cloth  6 each Topical Daily   ciprofloxacin  500 mg Oral BID   feeding supplement  237 mL Oral BID BM   insulin aspart  0-20 Units Subcutaneous TID WC   insulin aspart  0-5 Units Subcutaneous QHS   pantoprazole  40 mg Oral Daily   sertraline  50 mg Oral Daily   Continuous Infusions:   LOS: 3 days     Silvano Bilis, MD [facility]   If 7PM-7AM, please contact night-coverage [email or web address] Password [facility] 12/21/2023, 11:51 AM

## 2023-12-21 NOTE — Consult Note (Signed)
 PHARMACY CONSULT NOTE - FOLLOW UP  Pharmacy Consult for Electrolyte Monitoring and Replacement   Recent Labs: Potassium (mmol/L)  Date Value  12/21/2023 3.7  02/08/2015 3.6   Magnesium (mg/dL)  Date Value  40/98/1191 1.9   Calcium (mg/dL)  Date Value  47/82/9562 8.4 (L)   Calcium, Total (mg/dL)  Date Value  13/05/6577 8.5 (L)   Albumin (g/dL)  Date Value  46/96/2952 2.3 (L)  06/24/2023 4.3  02/08/2015 3.5   Phosphorus (mg/dL)  Date Value  84/13/2440 4.3   Sodium (mmol/L)  Date Value  12/21/2023 137  06/24/2023 137  02/08/2015 138   Assessment: AD is a 43 yo female who presented to the ED with fever, chills, fatigue which acutely started the night before presenting. They reported urinary frequency and mild shortness of breath. They were admitted to the ICU due to urosepsis requiring low dose levo. They've received fluids and are on antibiotics. Pharmacy has been consulted to manage this patient's electrolytes while they're in the ICU.   Goal of Therapy:  Electrolytes WNL  Plan:  No replacement needed. Patient transferred to 2A. Pharmacy will sign off. Please re-consult if needed.   Ronnald Ramp, PharmD 12/21/2023 7:54 AM

## 2023-12-22 DIAGNOSIS — N390 Urinary tract infection, site not specified: Secondary | ICD-10-CM | POA: Diagnosis not present

## 2023-12-22 DIAGNOSIS — A419 Sepsis, unspecified organism: Secondary | ICD-10-CM | POA: Diagnosis not present

## 2023-12-22 LAB — TYPE AND SCREEN
Unit division: 0
Unit division: 0
Unit division: 0
Unit division: 0

## 2023-12-22 LAB — GLUCOSE, CAPILLARY
Glucose-Capillary: 107 mg/dL — ABNORMAL HIGH (ref 70–99)
Glucose-Capillary: 112 mg/dL — ABNORMAL HIGH (ref 70–99)

## 2023-12-22 LAB — BPAM RBC
Blood Product Expiration Date: 202503262359
Blood Product Expiration Date: 202503262359
Blood Product Expiration Date: 202503262359
Blood Product Expiration Date: 202503272359
ISSUE DATE / TIME: 202503030850
ISSUE DATE / TIME: 202503040516
ISSUE DATE / TIME: 202503041123
Unit Type and Rh: 5100
Unit Type and Rh: 5100
Unit Type and Rh: 5100
Unit Type and Rh: 5100

## 2023-12-22 LAB — BASIC METABOLIC PANEL
Anion gap: 9 (ref 5–15)
BUN: 13 mg/dL (ref 6–20)
CO2: 25 mmol/L (ref 22–32)
Calcium: 8.3 mg/dL — ABNORMAL LOW (ref 8.9–10.3)
Chloride: 105 mmol/L (ref 98–111)
Creatinine, Ser: 0.93 mg/dL (ref 0.44–1.00)
GFR, Estimated: >60 mL/min (ref >60)
Glucose, Bld: 111 mg/dL — ABNORMAL HIGH (ref 70–99)
Potassium: 3.7 mmol/L (ref 3.5–5.1)
Sodium: 139 mmol/L (ref 135–145)

## 2023-12-22 LAB — CBC
HCT: 26.6 % — ABNORMAL LOW (ref 36.0–46.0)
Hemoglobin: 8.6 g/dL — ABNORMAL LOW (ref 12.0–15.0)
MCH: 25.2 pg — ABNORMAL LOW (ref 26.0–34.0)
MCHC: 32.3 g/dL (ref 30.0–36.0)
MCV: 78 fL — ABNORMAL LOW (ref 80.0–100.0)
Platelets: 453 10*3/uL — ABNORMAL HIGH (ref 150–400)
RBC: 3.41 MIL/uL — ABNORMAL LOW (ref 3.87–5.11)
RDW: 16.8 % — ABNORMAL HIGH (ref 11.5–15.5)
WBC: 10.8 10*3/uL — ABNORMAL HIGH (ref 4.0–10.5)
nRBC: 0 % (ref 0.0–0.2)

## 2023-12-22 MED ORDER — OXYBUTYNIN CHLORIDE 5 MG PO TABS
5.0000 mg | ORAL_TABLET | ORAL | Status: AC
  Administered 2023-12-22: 5 mg via ORAL
  Filled 2023-12-22: qty 1

## 2023-12-22 MED ORDER — CIPROFLOXACIN HCL 500 MG PO TABS: 500.0000 mg | ORAL_TABLET | Freq: Two times a day (BID) | ORAL | 0 refills | Status: AC

## 2023-12-22 NOTE — Progress Notes (Signed)
 Reviewed discharge instructions with patient. Patient acknowledged understanding. Patient discharged with personal belongings and wheeled out by staff. No distress noted in patient. Patient transported via family vehicle.

## 2023-12-22 NOTE — Plan of Care (Signed)
   Problem: Activity: Goal: Risk for activity intolerance will decrease Outcome: Progressing   Problem: Nutrition: Goal: Adequate nutrition will be maintained Outcome: Progressing   Problem: Safety: Goal: Ability to remain free from injury will improve Outcome: Progressing

## 2023-12-22 NOTE — Plan of Care (Signed)
  Problem: Coping: Goal: Ability to adjust to condition or change in health will improve Outcome: Progressing   Problem: Pain Managment: Goal: General experience of comfort will improve and/or be controlled Outcome: Progressing Note: Patient medicated for pain this shift with positive effect.   Problem: Skin Integrity: Goal: Risk for impaired skin integrity will decrease Note: Rash in place to arms and chest. Benadryl given as per PRN orders. Will continue to monitor.

## 2023-12-22 NOTE — Discharge Summary (Signed)
 Physician Discharge Summary  Jessica Fields ZOX:[id number] DOB: [birth year] DOA: 12/18/2023  PCP: Malva Limes, MD  Admit date: 12/18/2023 Discharge date: 12/22/2023  Admitted From: home  Disposition:  home   Recommendations for Outpatient Follow-up:  Follow up with PCP in 1-2 weeks F/u w/ gyn w/in 1 week   Home Health: no  Equipment/Devices:  Discharge Condition: stable CODE STATUS: full  Diet recommendation: Carb Modified  Brief/Interim Summary: HPI was taken from NP Ouma: 43 y.o female with significant PMH of HTN, T2DM, HLD, Obesity, OSA, chronic cystitis, IDA, anxiety and depression, endometriosis who presented to the ED with chief complaints of abdominal pain, fevers, chills, fatigue with associated urinary symptoms since last night.  Patient also endorses mild shortness of breath which she is attributing to her IDA, received iron infusion 2 days ago.   ED Course: Initial vital signs showed HR of 140 beats/minute, BP 88/44mm Hg, the RR 33 breaths/minute, and the oxygen saturation 92% on RA and a temperature of 102.88F (39.4C). Pertinent Labs/Diagnostics Findings: Na+/ K+:133/4.1 Glucose: 165 BUN/Cr.:38/2.11  WBC: 34.3K/L with bands or neutrophil predominance Hgb/Hct: 7.8/25.6 Plts:599  PCT: 2.12 Lactic acid: 2.6 COVID PCR: Negative,  CXR> negative Patient given 30 cc/kg of fluids and started on broad-spectrum antibiotics Vanco cefepime and Flagyl for sepsis due to suspected UTI.  Patient remained hypotensive despite IVF boluses therefore was started on Levophed.   PCCM consulted.  Discharge Diagnoses:  Principal Problem:   Sepsis due to urinary tract infection (HCC) Active Problems:   Morbid obesity (HCC)   Obstructive sleep apnea   Type 2 diabetes mellitus with morbid obesity (HCC)   Primary hypertension   Pelvic mass  Complicated UTI: urine cx grew e. coli. Continue on cipro.   Severe sepsis: initially required pressors which has since been weaned off. See Dr. Lelon Mast  note on how pt met severe sepsis criteria. Secondary to UTI    Pelvic mass: with large cystic lesion in pelvis as per MRI.  Patient recently established with og/gyn for this, plan was eventual surgery. Mass has grown slightly, favored by radiology to represent a large hemorrhagic ovarian cyst or endometrioma  Rash:new macular rash on arms today, slightly pruritic, no respiratory symptoms. Denies history of this rash. Possibly secondary to zosyn which has been d/c   Unlikely staph hominis bacteremia: likely containment  Iron deficiency anemia: possibly secondary to AUB. S/p 3 units of pRBCs transfused on 12/20/23  Generalized anxiety disorder: continue on home dose of zoloft   AKI: resolved    HTN: restart home dose of amlodipine, lisinopril/HCTZ   DM2: likely poorly controlled. Continue on home anti-DM meds at d/c    Morbid obesity: BMI 55.1. Complicates overall care & prognosis    OSA: CPAP qhs   Discharge Instructions  Discharge Instructions     Diet Carb Modified   Complete by: As directed    Discharge instructions   Complete by: As directed    F/u w/ PCP in 1-2 weeks. F/u w/ gyn within 1 week   Increase activity slowly   Complete by: As directed       Allergies as of 12/22/2023   No Known Allergies      Medication List     STOP taking these medications    fluconazole 100 MG tablet Commonly known as: Diflucan   penicillin v potassium 500 MG tablet Commonly known as: VEETID   promethazine 25 MG tablet Commonly known as: PHENERGAN       TAKE these  medications    amLODipine 10 MG tablet Commonly known as: NORVASC TAKE 1 TABLET(10 MG) BY MOUTH DAILY   celecoxib 200 MG capsule Commonly known as: CELEBREX TAKE 1 CAPSULE BY MOUTH TWICE DAILY   ciprofloxacin 500 MG tablet Commonly known as: CIPRO Take 1 tablet (500 mg total) by mouth 2 (two) times daily for 7 days.   diclofenac sodium 1 % Gel Commonly known as: VOLTAREN Apply 4 g topically 4 (four)  times daily as needed.   fluticasone 50 MCG/ACT nasal spray Commonly known as: FLONASE Place 2 sprays into both nostrils daily as needed.   lisinopril-hydrochlorothiazide 10-12.5 MG tablet Commonly known as: ZESTORETIC TAKE 1 TABLET BY MOUTH DAILY   Melatonin 10 MG Tabs Take 10 mg by mouth at bedtime as needed.   metFORMIN 500 MG tablet Commonly known as: GLUCOPHAGE TAKE 1 TABLET(500 MG) BY MOUTH THREE TIMES DAILY WITH MEALS   omeprazole 20 MG capsule Commonly known as: PRILOSEC TAKE 1 CAPSULE(20 MG) BY MOUTH DAILY   oxyCODONE-acetaminophen 7.5-325 MG tablet Commonly known as: PERCOCET Take 2 tablets by mouth every 6 (six) hours as needed for severe pain (pain score 7-10).   sertraline 50 MG tablet Commonly known as: ZOLOFT TAKE 1 TABLET(50 MG) BY MOUTH DAILY   tirzepatide 10 MG/0.5ML Pen Commonly known as: MOUNJARO Inject 10 mg into the skin once a week.        No Known Allergies  Consultations: Obgyn ICU   Procedures/Studies: ECHOCARDIOGRAM COMPLETE Result Date: 12/20/2023    ECHOCARDIOGRAM REPORT   Patient Name:   Jessica Fields Date of Exam: 12/20/2023 Medical Rec #:  [id number]    Height:       66.0 in Accession #:    [id number]   Weight:       352.5 lb Date of Birth:  Jan 15, 1981    BSA:          2.544 m Patient Age:    42 years     BP:           107/55 mmHg Patient Gender: F            HR:           90 bpm. Exam Location:  [facility] Procedure: 2D Echo, Cardiac Doppler, Color Doppler and Strain Analysis (Both            Spectral and Color Flow Doppler were utilized during procedure). Indications:     Murmur  History:         Patient has no prior history of Echocardiogram examinations.                  Signs/Symptoms:Murmur; Risk Factors:Hypertension, Diabetes and                  Sleep Apnea.  Sonographer:     Mikki Harbor Referring Phys:  [id number] KHABIB DGAYLI Diagnosing Phys: Yvonne Kendall MD  Sonographer Comments: Patient is obese. Global longitudinal strain was  attempted. IMPRESSIONS  1. Left ventricular ejection fraction, by estimation, is 55 to 60%. The left ventricle has normal function. The left ventricle has no regional wall motion abnormalities. There is mild left ventricular hypertrophy. Left ventricular diastolic parameters were normal. The average left ventricular global longitudinal strain is -16.4 %. The global longitudinal strain is normal.  2. Right ventricular systolic function is normal. The right ventricular size is normal. There is mildly elevated pulmonary artery systolic pressure.  3. The mitral valve is abnormal. Trivial mitral valve regurgitation.  4. The aortic valve is tricuspid. Aortic valve regurgitation is not visualized. No aortic stenosis is present.  5. The inferior vena cava is dilated in size with >50% respiratory variability, suggesting right atrial pressure of 8 mmHg. FINDINGS  Left Ventricle: Left ventricular ejection fraction, by estimation, is 55 to 60%. The left ventricle has normal function. The left ventricle has no regional wall motion abnormalities. The average left ventricular global longitudinal strain is -16.4 %. Strain was performed and the global longitudinal strain is normal. The left ventricular internal cavity size was normal in size. There is mild left ventricular hypertrophy. Left ventricular diastolic parameters were normal. Right Ventricle: The right ventricular size is normal. No increase in right ventricular wall thickness. Right ventricular systolic function is normal. There is mildly elevated pulmonary artery systolic pressure. The tricuspid regurgitant velocity is 2.81  m/s, and with an assumed right atrial pressure of 8 mmHg, the estimated right ventricular systolic pressure is 39.6 mmHg. Left Atrium: Left atrial size was normal in size. Right Atrium: Right atrial size was normal in size. Pericardium: There is no evidence of pericardial effusion. Mitral Valve: The mitral valve is abnormal. There is mild thickening  of the mitral valve leaflet(s). Trivial mitral valve regurgitation. MV peak gradient, 8.3 mmHg. The mean mitral valve gradient is 4.0 mmHg. Tricuspid Valve: The tricuspid valve is normal in structure. Tricuspid valve regurgitation is mild. Aortic Valve: The aortic valve is tricuspid. Aortic valve regurgitation is not visualized. No aortic stenosis is present. Aortic valve mean gradient measures 8.0 mmHg. Aortic valve peak gradient measures 16.6 mmHg. Aortic valve area, by VTI measures 2.28  cm. Pulmonic Valve: The pulmonic valve was not well visualized. Pulmonic valve regurgitation is not visualized. No evidence of pulmonic stenosis. Aorta: The aortic root is normal in size and structure. Pulmonary Artery: The pulmonary artery is not well seen. Venous: The inferior vena cava is dilated in size with greater than 50% respiratory variability, suggesting right atrial pressure of 8 mmHg. IAS/Shunts: The interatrial septum was not well visualized.  LEFT VENTRICLE PLAX 2D LVIDd:         4.60 cm      Diastology LVIDs:         3.30 cm      LV e' medial:    13.50 cm/s LV PW:         1.24 cm      LV E/e' medial:  10.4 LV IVS:        1.00 cm      LV e' lateral:   16.20 cm/s LVOT diam:     2.00 cm      LV E/e' lateral: 8.7 LV SV:         90 LV SV Index:   35           2D Longitudinal Strain LVOT Area:     3.14 cm     2D Strain GLS Avg:     -16.4 %  LV Volumes (MOD) LV vol d, MOD A2C: 96.5 ml LV vol d, MOD A4C: 111.0 ml LV vol s, MOD A2C: 41.6 ml LV vol s, MOD A4C: 47.1 ml LV SV MOD A2C:     54.9 ml LV SV MOD A4C:     111.0 ml LV SV MOD BP:      63.2 ml RIGHT VENTRICLE RV Basal diam:  3.90 cm RV Mid diam:    3.40 cm RV S prime:     20.80 cm/s TAPSE (M-mode): 3.2  cm LEFT ATRIUM             Index        RIGHT ATRIUM           Index LA diam:        3.80 cm 1.49 cm/m   RA Area:     19.50 cm LA Vol (A2C):   60.1 ml 23.62 ml/m  RA Volume:   62.10 ml  24.41 ml/m LA Vol (A4C):   47.1 ml 18.51 ml/m LA Biplane Vol: 54.1 ml 21.26  ml/m  AORTIC VALVE                     PULMONIC VALVE AV Area (Vmax):    2.40 cm      PV Vmax:       1.38 m/s AV Area (Vmean):   2.17 cm      PV Peak grad:  7.6 mmHg AV Area (VTI):     2.28 cm AV Vmax:           204.00 cm/s AV Vmean:          131.000 cm/s AV VTI:            0.392 m AV Peak Grad:      16.6 mmHg AV Mean Grad:      8.0 mmHg LVOT Vmax:         156.00 cm/s LVOT Vmean:        90.300 cm/s LVOT VTI:          0.285 m LVOT/AV VTI ratio: 0.73  AORTA Ao Root diam: 2.90 cm MITRAL VALVE                TRICUSPID VALVE MV Area (PHT): 4.33 cm     TR Peak grad:   31.6 mmHg MV Area VTI:   2.43 cm     TR Vmax:        281.00 cm/s MV Peak grad:  8.3 mmHg MV Mean grad:  4.0 mmHg     SHUNTS MV Vmax:       1.44 m/s     Systemic VTI:  0.29 m MV Vmean:      94.0 cm/s    Systemic Diam: 2.00 cm MV Decel Time: 175 msec MV E velocity: 141.00 cm/s MV A velocity: 114.00 cm/s MV E/A ratio:  1.24 Christopher End MD Electronically signed by Yvonne Kendall MD Signature Date/Time: 12/20/2023/9:53:49 AM    Final    MR ABDOMEN W WO CONTRAST Result Date: 12/19/2023 CLINICAL DATA:  Adnexal mass, pelvic cystic lesion, low hemoglobin, concern for ruptured cyst EXAM: MRI ABDOMEN AND PELVIS WITHOUT AND WITH CONTRAST TECHNIQUE: Multiplanar multisequence MR imaging of the abdomen and pelvis was performed both before and after the administration of intravenous contrast. CONTRAST:  10mL GADAVIST GADOBUTROL 1 MMOL/ML IV SOLN COMPARISON:  12/09/2023 FINDINGS: COMBINED FINDINGS FOR BOTH MR ABDOMEN AND PELVIS Lower chest: No acute abnormality. Hepatobiliary: No focal liver abnormality is seen. Hepatomegaly, maximum coronal span 22.6 cm. Mild hepatic steatosis. Status post cholecystectomy. No biliary dilatation. Pancreas: Unremarkable. No pancreatic ductal dilatation or surrounding inflammatory changes. Spleen: Splenomegaly, maximum span 14.7 cm. Adrenals/Urinary Tract: Adrenal glands are unremarkable. Kidneys are normal, without renal calculi,  solid lesion, or hydronephrosis. Bladder is unremarkable. Stomach/Bowel: Stomach is within normal limits. Appendix appears normal. No evidence of bowel wall thickening, distention, or inflammatory changes. Vascular/Lymphatic: No significant vascular findings are present. No enlarged abdominal or pelvic lymph nodes. Reproductive: Large cystic  lesion in the pelvis has slightly increased in size, on today's examination measuring 11.7 x 11.1 cm, previously 10.5 x 10.0 cm when measured similarly (series 4, image 17). An internal septation has resolved, and this remains with homogeneous intrinsically T1 and T2 intermediate signal and is without internal contrast enhancement. As on prior examination, this is essentially inseparable from the posterior uterine fundus, and multiple uterine fibroids almost completely efface the normal endometrial cavity (series 12, image 21). No normal left ovarian tissue is visible. Normal right ovary with a small functional cyst or follicle requiring no further follow-up or characterization. IUD in the fundal endometrial cavity. Other: No abdominal wall hernia or abnormality. No ascites. Musculoskeletal: No acute or significant osseous findings. IMPRESSION: 1. Large cystic lesion in the pelvis has slightly increased in size, on today's examination measuring 11.7 x 11.1 cm, previously 10.5 x 10.0 cm when measured similarly. An internal septation has resolved, and this remains with homogeneous intrinsically T1 and T2 intermediate signal and is without internal contrast enhancement. Given behavior over the short interval and patient demographic, a large hemorrhagic ovarian cyst or alternately an endometrioma strongly favored. 2. No acute findings in the abdomen. Specifically no evidence of free fluid in the abdomen or pelvis to suggest cyst rupture. Electronically Signed   By: Jearld Lesch M.D.   On: 12/19/2023 07:03   MR PELVIS W WO CONTRAST Result Date: 12/19/2023 CLINICAL DATA:  Adnexal  mass, pelvic cystic lesion, low hemoglobin, concern for ruptured cyst EXAM: MRI ABDOMEN AND PELVIS WITHOUT AND WITH CONTRAST TECHNIQUE: Multiplanar multisequence MR imaging of the abdomen and pelvis was performed both before and after the administration of intravenous contrast. CONTRAST:  10mL GADAVIST GADOBUTROL 1 MMOL/ML IV SOLN COMPARISON:  12/09/2023 FINDINGS: COMBINED FINDINGS FOR BOTH MR ABDOMEN AND PELVIS Lower chest: No acute abnormality. Hepatobiliary: No focal liver abnormality is seen. Hepatomegaly, maximum coronal span 22.6 cm. Mild hepatic steatosis. Status post cholecystectomy. No biliary dilatation. Pancreas: Unremarkable. No pancreatic ductal dilatation or surrounding inflammatory changes. Spleen: Splenomegaly, maximum span 14.7 cm. Adrenals/Urinary Tract: Adrenal glands are unremarkable. Kidneys are normal, without renal calculi, solid lesion, or hydronephrosis. Bladder is unremarkable. Stomach/Bowel: Stomach is within normal limits. Appendix appears normal. No evidence of bowel wall thickening, distention, or inflammatory changes. Vascular/Lymphatic: No significant vascular findings are present. No enlarged abdominal or pelvic lymph nodes. Reproductive: Large cystic lesion in the pelvis has slightly increased in size, on today's examination measuring 11.7 x 11.1 cm, previously 10.5 x 10.0 cm when measured similarly (series 4, image 17). An internal septation has resolved, and this remains with homogeneous intrinsically T1 and T2 intermediate signal and is without internal contrast enhancement. As on prior examination, this is essentially inseparable from the posterior uterine fundus, and multiple uterine fibroids almost completely efface the normal endometrial cavity (series 12, image 21). No normal left ovarian tissue is visible. Normal right ovary with a small functional cyst or follicle requiring no further follow-up or characterization. IUD in the fundal endometrial cavity. Other: No abdominal  wall hernia or abnormality. No ascites. Musculoskeletal: No acute or significant osseous findings. IMPRESSION: 1. Large cystic lesion in the pelvis has slightly increased in size, on today's examination measuring 11.7 x 11.1 cm, previously 10.5 x 10.0 cm when measured similarly. An internal septation has resolved, and this remains with homogeneous intrinsically T1 and T2 intermediate signal and is without internal contrast enhancement. Given behavior over the short interval and patient demographic, a large hemorrhagic ovarian cyst or alternately an endometrioma strongly  favored. 2. No acute findings in the abdomen. Specifically no evidence of free fluid in the abdomen or pelvis to suggest cyst rupture. Electronically Signed   By: Jearld Lesch M.D.   On: 12/19/2023 07:03   DG Chest 2 View Result Date: 12/18/2023 CLINICAL DATA:  Fever and chills, weakness EXAM: CHEST - 2 VIEW COMPARISON:  None Available. FINDINGS: The heart size and mediastinal contours are within normal limits. Both lungs are clear. The visualized skeletal structures are unremarkable. IMPRESSION: No active cardiopulmonary disease. Electronically Signed   By: Sharlet Salina M.D.   On: 12/18/2023 18:26   MR PELVIS W WO CONTRAST Result Date: 12/09/2023 CLINICAL DATA:  Adnexal mass, malignancy suspected, cystic lesion of the pelvis identified by prior ultrasound EXAM: MRI ABDOMEN AND PELVIS WITHOUT AND WITH CONTRAST TECHNIQUE: Multiplanar multisequence MR imaging of the abdomen and pelvis was performed both before and after the administration of intravenous contrast. CONTRAST:  10mL GADAVIST GADOBUTROL 1 MMOL/ML IV SOLN COMPARISON:  None Available. FINDINGS: COMBINED FINDINGS FOR BOTH MR ABDOMEN AND PELVIS Lower chest: No acute abnormality. Hepatobiliary: No solid liver abnormality is seen. Hepatomegaly, maximum coronal span 21.9 cm. Status post cholecystectomy. Postoperative biliary ductal dilatation Pancreas: Unremarkable. No pancreatic ductal  dilatation or surrounding inflammatory changes. Spleen: Splenomegaly, maximum span 14.6 cm. Adrenals/Urinary Tract: Adrenal glands are unremarkable. Kidneys are normal, without renal calculi, solid lesion, or hydronephrosis. Bladder is unremarkable. Stomach/Bowel: Stomach is within normal limits. Appendix appears normal. No evidence of bowel wall thickening, distention, or inflammatory changes. Vascular/Lymphatic: No significant vascular findings are present. No enlarged abdominal or pelvic lymph nodes. Reproductive: IUD in the endometrial cavity. Contrast enhancing fibroids which distort and almost completely efface endometrial cavity. At the posterior aspect of the uterine fundus and distorting the contour of the uterus, there is a large, septated lesion measuring 10.6 x 10.2 x 9.2 cm (series 24, image 15, series 26, image 18). This demonstrates heterogeneous internal T1 and T2 intermediate signal and by manual measurements (subtraction images not provided for review), this does not appear to enhance on multiphasic sequences. Normal left ovarian tissue is not clearly visualized. Normal right ovary with a small, benign, functional cysts requiring no specific further follow-up or characterization. Other: No abdominal wall hernia or abnormality. No ascites. Musculoskeletal: No acute or significant osseous findings. IMPRESSION: 1. At the posterior aspect of the uterine fundus and distorting the contour of the uterus, there is a large, septated lesion measuring 10.6 x 10.2 x 9.2 cm. This demonstrates heterogeneous internal T1 and T2 intermediate and does not appear to enhance on multiphasic sequences. Given intimate relationship to the uterus this may reflect a large degenerated fibroid, or alternately a large hemorrhagic left ovarian cyst or endometrioma. Cystic ovarian malignancy not generally excluded but not favored. 2. Normal left ovarian tissue is not clearly visualized. 3. Normal right ovary with a small,  benign, functional cyst requiring no specific further follow-up or characterization. 4. IUD in the endometrial cavity. Non degenerated uterine fibroids which distort and almost completely efface endometrial cavity. Electronically Signed   By: Jearld Lesch M.D.   On: 12/09/2023 15:50   MR Abdomen W or Wo Contrast Result Date: 12/09/2023 CLINICAL DATA:  Adnexal mass, malignancy suspected, cystic lesion of the pelvis identified by prior ultrasound EXAM: MRI ABDOMEN AND PELVIS WITHOUT AND WITH CONTRAST TECHNIQUE: Multiplanar multisequence MR imaging of the abdomen and pelvis was performed both before and after the administration of intravenous contrast. CONTRAST:  10mL GADAVIST GADOBUTROL 1 MMOL/ML IV SOLN COMPARISON:  None Available. FINDINGS: COMBINED FINDINGS FOR BOTH MR ABDOMEN AND PELVIS Lower chest: No acute abnormality. Hepatobiliary: No solid liver abnormality is seen. Hepatomegaly, maximum coronal span 21.9 cm. Status post cholecystectomy. Postoperative biliary ductal dilatation Pancreas: Unremarkable. No pancreatic ductal dilatation or surrounding inflammatory changes. Spleen: Splenomegaly, maximum span 14.6 cm. Adrenals/Urinary Tract: Adrenal glands are unremarkable. Kidneys are normal, without renal calculi, solid lesion, or hydronephrosis. Bladder is unremarkable. Stomach/Bowel: Stomach is within normal limits. Appendix appears normal. No evidence of bowel wall thickening, distention, or inflammatory changes. Vascular/Lymphatic: No significant vascular findings are present. No enlarged abdominal or pelvic lymph nodes. Reproductive: IUD in the endometrial cavity. Contrast enhancing fibroids which distort and almost completely efface endometrial cavity. At the posterior aspect of the uterine fundus and distorting the contour of the uterus, there is a large, septated lesion measuring 10.6 x 10.2 x 9.2 cm (series 24, image 15, series 26, image 18). This demonstrates heterogeneous internal T1 and T2  intermediate signal and by manual measurements (subtraction images not provided for review), this does not appear to enhance on multiphasic sequences. Normal left ovarian tissue is not clearly visualized. Normal right ovary with a small, benign, functional cysts requiring no specific further follow-up or characterization. Other: No abdominal wall hernia or abnormality. No ascites. Musculoskeletal: No acute or significant osseous findings. IMPRESSION: 1. At the posterior aspect of the uterine fundus and distorting the contour of the uterus, there is a large, septated lesion measuring 10.6 x 10.2 x 9.2 cm. This demonstrates heterogeneous internal T1 and T2 intermediate and does not appear to enhance on multiphasic sequences. Given intimate relationship to the uterus this may reflect a large degenerated fibroid, or alternately a large hemorrhagic left ovarian cyst or endometrioma. Cystic ovarian malignancy not generally excluded but not favored. 2. Normal left ovarian tissue is not clearly visualized. 3. Normal right ovary with a small, benign, functional cyst requiring no specific further follow-up or characterization. 4. IUD in the endometrial cavity. Non degenerated uterine fibroids which distort and almost completely efface endometrial cavity. Electronically Signed   By: Jearld Lesch M.D.   On: 12/09/2023 15:50   US PELVIC COMPLETE W TRANSVAGINAL AND TORSION R/O Result Date: 12/09/2023 CLINICAL DATA:  Lower abdominal pain, vaginal bleeding for 10 days. EXAM: TRANSABDOMINAL AND TRANSVAGINAL ULTRASOUND OF PELVIS DOPPLER ULTRASOUND OF OVARIES TECHNIQUE: Both transabdominal and transvaginal ultrasound examinations of the pelvis were performed. Transabdominal technique was performed for global imaging of the pelvis including uterus, ovaries, adnexal regions, and pelvic cul-de-sac. It was necessary to proceed with endovaginal exam following the transabdominal exam to visualize the endometrium and ovaries. Color and  duplex Doppler ultrasound was utilized to evaluate blood flow to the ovaries. COMPARISON:  March 24, 2009. FINDINGS: Uterus Measurements: 6.2 x 3.8 x 3.1 cm = volume: 38 mL. Probable fibroid measuring 4.9 x 4.7 x 4.0 cm is noted posteriorly. Endometrium Thickness: 5 mm which is within normal limits. No focal abnormality visualized. Right ovary Not clearly visualized. Left ovary Not clearly visualized. Pulsed Doppler evaluation of cystic mass posteriorly and pelvis demonstrates normal low-resistance arterial and venous waveforms. Other findings 9.3 x 9.1 x 8.7 cm mildly complex cystic lesion is noted posteriorly in the midline of the pelvis. This may be ovarian in etiology, but it cannot be determined from which ovary it arises, if any. Potentially may represent endometrioma. IMPRESSION: Ovaries are not clearly visualized and therefore ovarian torsion cannot be excluded on the basis of this exam. 9.3 x 9.1 x 8.7 cm mildly complex  cystic lesion is noted posteriorly in the midline of the pelvis. This may be ovarian etiology, but it cannot be determined from which ovary it arises, if any. Potentially this may represent endometrioma. MRI is recommended for further evaluation. Probable 4.9 cm fibroid noted posteriorly and uterus. Electronically Signed   By: Lupita Raider M.D.   On: 12/09/2023 13:07   (Echo, Carotid, EGD, Colonoscopy, ERCP)    Subjective: Pt c/o intermittent pelvic pain    Discharge Exam: Vitals:   12/21/23 2138 12/22/23 0806  BP: 125/72 132/68  Pulse: 100 92  Resp: 18 17  Temp:  97.9 F (36.6 C)  SpO2: 97% 100%   Vitals:   12/21/23 1718 12/21/23 2138 12/22/23 0500 12/22/23 0806  BP: 124/64 125/72  132/68  Pulse: (!) 107 100  92  Resp: 16 18  17   Temp: 98.5 F (36.9 C)   97.9 F (36.6 C)  TempSrc:      SpO2: 98% 97%  100%  Weight:   (!) 155 kg   Height:        General: Pt is alert, awake, not in acute distress. Appears older than stated age. Morbidly obese Cardiovascular:  S1/S2 +, no rubs, no gallops Respiratory: decreased breath sounds b/l  Abdominal: Soft, NT, obese, bowel sounds + Extremities: no cyanosis    The results of significant diagnostics from this hospitalization (including imaging, microbiology, ancillary and laboratory) are listed below for reference.     Microbiology: Recent Results (from the past 240 hours)  Culture, blood (Routine x 2)     Status: None (Preliminary result)   Collection Time: 12/18/23  6:10 PM   Specimen: BLOOD  Result Value Ref Range Status   Specimen Description BLOOD RIGHT ANTECUBITAL  Final   Special Requests   Final    BOTTLES DRAWN AEROBIC AND ANAEROBIC Blood Culture results may not be optimal due to an inadequate volume of blood received in culture bottles   Culture   Final    NO GROWTH 4 DAYS Performed at [facility] [address] [place], [place] [address]    Report Status PENDING  Incomplete  Culture, blood (Routine x 2)     Status: Abnormal   Collection Time: 12/18/23  6:42 PM   Specimen: BLOOD LEFT ARM  Result Value Ref Range Status   Specimen Description   Final    BLOOD LEFT ARM Performed at [facility], [address] [place], [place] [address]    Special Requests   Final    BOTTLES DRAWN AEROBIC AND ANAEROBIC Blood Culture results may not be optimal due to an inadequate volume of blood received in culture bottles Performed at [facility], [address] [place], [place] [address]    Culture  Setup Time   Final    GRAM POSITIVE COCCI ANAEROBIC BOTTLE ONLY Organism ID to follow CRITICAL RESULT CALLED TO, READ BACK BY AND VERIFIED WITH: AMANDA CHOI 1810 12/19/23 MU    Culture (A)  Final    STAPHYLOCOCCUS HOMINIS THE SIGNIFICANCE OF ISOLATING THIS ORGANISM FROM A SINGLE SET OF BLOOD CULTURES WHEN MULTIPLE SETS ARE DRAWN IS UNCERTAIN. PLEASE NOTIFY THE MICROBIOLOGY DEPARTMENT WITHIN ONE WEEK IF SPECIATION AND SENSITIVITIES ARE REQUIRED. Performed at [facility], [address], [place], [place] [address]    Report Status 12/21/2023 FINAL  Final  Resp panel by RT-PCR (RSV, Flu A&B, Covid) Anterior Nasal Swab     Status: None   Collection Time: 12/18/23  6:42 PM   Specimen: Anterior Nasal Swab  Result Value Ref Range Status   SARS Coronavirus 2 by RT PCR NEGATIVE NEGATIVE Final    Comment: (NOTE) SARS-CoV-2 target nucleic acids are NOT DETECTED.  The SARS-CoV-2 RNA is generally detectable in upper respiratory specimens during the acute phase of infection. The lowest concentration of SARS-CoV-2 viral copies this assay can detect is 138 copies/mL. A negative result does not preclude SARS-Cov-2 infection and should not be used as the sole basis for treatment or other patient management decisions. A negative result may occur with  improper specimen collection/handling, submission of specimen other than nasopharyngeal swab, presence of viral mutation(s) within the areas targeted by this assay, and inadequate number of viral copies(<138 copies/mL). A negative result must be combined with clinical observations, patient history, and epidemiological information. The expected result is Negative.  Fact Sheet for Patients:  BloggerCourse.com  Fact Sheet for Healthcare Providers:  SeriousBroker.it  This test is no t yet approved or cleared by the [place] FDA and  has been authorized for detection and/or diagnosis of SARS-CoV-2 by FDA under an Emergency Use Authorization (EUA). This EUA will remain  in effect (meaning this test can be used) for the duration of the COVID-19 declaration under Section 564(b)(1) of the Act, 21 U.S.C.section 360bbb-3(b)(1), unless the authorization is terminated  or revoked sooner.       Influenza A by PCR NEGATIVE NEGATIVE Final   Influenza B by PCR NEGATIVE NEGATIVE Final    Comment: (NOTE) The Xpert Xpress SARS-CoV-2/FLU/RSV plus assay is intended as an  aid in the diagnosis of influenza from Nasopharyngeal swab specimens and should not be used as a sole basis for treatment. Nasal washings and aspirates are unacceptable for Xpert Xpress SARS-CoV-2/FLU/RSV testing.  Fact Sheet for Patients: BloggerCourse.com  Fact Sheet for Healthcare Providers: SeriousBroker.it  This test is not yet approved or cleared by the [place] FDA and has been authorized for detection and/or diagnosis of SARS-CoV-2 by FDA under an Emergency Use Authorization (EUA). This EUA will remain in effect (meaning this test can be used) for the duration of the COVID-19 declaration under Section 564(b)(1) of the Act, 21 U.S.C. section 360bbb-3(b)(1), unless the authorization is terminated or revoked.     Resp Syncytial Virus by PCR NEGATIVE NEGATIVE Final    Comment: (NOTE) Fact Sheet for Patients: BloggerCourse.com  Fact Sheet for Healthcare Providers: SeriousBroker.it  This test is not yet approved or cleared by the [place] FDA and has been authorized for detection and/or diagnosis of SARS-CoV-2 by FDA under an Emergency Use Authorization (EUA). This EUA will remain in effect (meaning this test can be used) for the duration of the COVID-19 declaration under Section 564(b)(1) of the Act, 21 U.S.C. section 360bbb-3(b)(1), unless the authorization is terminated or revoked.  Performed at [facility], [address], [place], [place] [address]   Urine Culture     Status: Abnormal   Collection Time: 12/18/23  6:42 PM   Specimen: Urine, Random  Result Value Ref Range Status   Specimen Description   Final    URINE, RANDOM Performed at [facility], [address] [place], [place] [address]    Special Requests   Final    NONE Performed at [facility], [address] [place], [place] [address]    Culture >=100,000  COLONIES/mL ESCHERICHIA COLI (A)  Final   Report Status 12/21/2023 FINAL  Final   Organism ID, Bacteria ESCHERICHIA COLI (A)  Final      Susceptibility   Escherichia coli - MIC*    AMPICILLIN <=2 SENSITIVE Sensitive     CEFAZOLIN <=4 SENSITIVE Sensitive     CEFEPIME <=0.12 SENSITIVE Sensitive     CEFTRIAXONE <=0.25 SENSITIVE Sensitive     CIPROFLOXACIN <=0.25 SENSITIVE Sensitive     GENTAMICIN <=1 SENSITIVE Sensitive     IMIPENEM <=0.25 SENSITIVE Sensitive     NITROFURANTOIN <=16 SENSITIVE Sensitive     TRIMETH/SULFA <=20 SENSITIVE Sensitive     AMPICILLIN/SULBACTAM <=2 SENSITIVE Sensitive     PIP/TAZO <=4 SENSITIVE Sensitive ug/mL    * >=100,000 COLONIES/mL ESCHERICHIA COLI  Blood Culture ID Panel (Reflexed)     Status: Abnormal   Collection Time: 12/18/23  6:42 PM  Result Value Ref Range Status   Enterococcus faecalis NOT DETECTED NOT DETECTED Final   Enterococcus Faecium NOT DETECTED NOT DETECTED Final   Listeria monocytogenes NOT DETECTED NOT DETECTED Final   Staphylococcus species DETECTED (A) NOT DETECTED Final    Comment: CRITICAL RESULT CALLED TO, READ BACK BY AND VERIFIED WITH: AMANDA CHOI 1810 12/19/23 MU    Staphylococcus aureus (BCID) NOT DETECTED NOT DETECTED Final   Staphylococcus epidermidis NOT DETECTED NOT DETECTED Final   Staphylococcus lugdunensis NOT DETECTED NOT DETECTED Final   Streptococcus species NOT DETECTED NOT DETECTED Final   Streptococcus agalactiae NOT DETECTED NOT DETECTED Final   Streptococcus pneumoniae NOT DETECTED NOT DETECTED Final   Streptococcus pyogenes NOT DETECTED NOT DETECTED Final   A.calcoaceticus-baumannii NOT DETECTED NOT DETECTED Final   Bacteroides fragilis NOT DETECTED NOT DETECTED Final   Enterobacterales NOT DETECTED NOT DETECTED Final   Enterobacter cloacae complex NOT DETECTED NOT DETECTED Final   Escherichia coli NOT DETECTED NOT DETECTED Final   Klebsiella aerogenes NOT DETECTED NOT DETECTED Final   Klebsiella oxytoca NOT  DETECTED NOT DETECTED Final   Klebsiella pneumoniae NOT DETECTED NOT DETECTED Final   Proteus species NOT DETECTED NOT DETECTED Final   Salmonella species NOT DETECTED NOT DETECTED Final   Serratia marcescens NOT DETECTED NOT DETECTED Final   Haemophilus influenzae NOT DETECTED NOT DETECTED Final   Neisseria meningitidis NOT DETECTED NOT DETECTED Final   Pseudomonas aeruginosa NOT DETECTED NOT DETECTED Final   Stenotrophomonas maltophilia NOT DETECTED NOT DETECTED Final   Candida albicans NOT DETECTED NOT DETECTED Final   Candida auris NOT DETECTED NOT DETECTED Final   Candida glabrata NOT DETECTED NOT DETECTED Final   Candida krusei NOT DETECTED NOT DETECTED Final   Candida parapsilosis NOT DETECTED NOT DETECTED Final   Candida tropicalis NOT DETECTED NOT DETECTED Final   Cryptococcus neoformans/gattii NOT DETECTED NOT DETECTED Final    Comment: Performed at [facility], [address] [place], [place] [address]  MRSA Next Gen by PCR, Nasal     Status: None   Collection Time: 12/18/23 11:01 PM   Specimen: Nasal Mucosa; Nasal Swab  Result Value Ref Range Status   MRSA by PCR Next Gen NOT DETECTED NOT DETECTED Final    Comment: (NOTE) The GeneXpert MRSA Assay (FDA approved for NASAL specimens only), is one component of a comprehensive MRSA colonization surveillance program. It is not intended to diagnose MRSA infection nor to guide or monitor treatment for MRSA infections. Test performance is not FDA approved in patients less than 26 years old. Performed at [facility], [address] [place], [place] [address]      Labs: BNP (last 3 results) No results for input(s): "BNP" in the last 8760  hours. Basic Metabolic Panel: Recent Labs  Lab 12/18/23 1842 12/19/23 0331 12/20/23 0346 12/21/23 0541 12/22/23 0620  NA 133* 132* 136 137 139  K 4.1 3.9 3.8 3.7 3.7  CL 99 102 103 104 105  CO2 21* 20* 23 23 25   GLUCOSE 165* 130* 117* 108* 111*  BUN 38* 33*  32* 23* 13  CREATININE 2.11* 1.70* 1.37* 1.12* 0.93  CALCIUM 8.4* 7.6* 8.0* 8.4* 8.3*  MG  --   --   --  1.9  --   PHOS  --   --   --  4.3  --    Liver Function Tests: Recent Labs  Lab 12/18/23 1842  AST 19  ALT 11  ALKPHOS 90  BILITOT 0.5  PROT 7.8  ALBUMIN 2.3*   No results for input(s): "LIPASE", "AMYLASE" in the last 168 hours. No results for input(s): "AMMONIA" in the last 168 hours. CBC: Recent Labs  Lab 12/18/23 1806 12/19/23 0621 12/19/23 1457 12/19/23 2038 12/20/23 0346 12/20/23 1039 12/20/23 1734 12/21/23 0145 12/21/23 0948 12/22/23 0620  WBC 34.3*   < > 25.1*  --  18.8*  --  18.1* 15.1* 13.2* 10.8*  NEUTROABS 29.9*  --  21.8*  --   --   --   --   --   --   --   HGB 7.8*   < > 8.0*   < > 6.7* 7.9* 8.5* 7.8* 8.8* 8.6*  HCT 25.6*   < > 25.3*   < > 21.0* 24.9* 26.6* 24.0* 27.2* 26.6*  MCV 77.6*   < > 76.4*  --  74.7*  --  77.8* 76.9* 76.6* 78.0*  PLT 599*   < > 491*  --  459*  --  488* 431* 470* 453*   < > = values in this interval not displayed.   Cardiac Enzymes: No results for input(s): "CKTOTAL", "CKMB", "CKMBINDEX", "TROPONINI" in the last 168 hours. BNP: Invalid input(s): "POCBNP" CBG: Recent Labs  Lab 12/21/23 1553 12/21/23 1751 12/21/23 2139 12/22/23 0804 12/22/23 1140  GLUCAP 114* 125* 107* 107* 112*   D-Dimer No results for input(s): "DDIMER" in the last 72 hours. Hgb A1c No results for input(s): "HGBA1C" in the last 72 hours. Lipid Profile No results for input(s): "CHOL", "HDL", "LDLCALC", "TRIG", "CHOLHDL", "LDLDIRECT" in the last 72 hours. Thyroid function studies No results for input(s): "TSH", "T4TOTAL", "T3FREE", "THYROIDAB" in the last 72 hours.  Invalid input(s): "FREET3" Anemia work up No results for input(s): "VITAMINB12", "FOLATE", "FERRITIN", "TIBC", "IRON", "RETICCTPCT" in the last 72 hours. Urinalysis    Component Value Date/Time   COLORURINE YELLOW (A) 12/18/2023 1842   APPEARANCEUR TURBID (A) 12/18/2023 1842    APPEARANCEUR Clear 02/08/2015 2325   LABSPEC 1.014 12/18/2023 1842   LABSPEC 1.019 02/08/2015 2325   PHURINE 5.0 12/18/2023 1842   GLUCOSEU NEGATIVE 12/18/2023 1842   GLUCOSEU Negative 02/08/2015 2325   HGBUR LARGE (A) 12/18/2023 1842   BILIRUBINUR NEGATIVE 12/18/2023 1842   BILIRUBINUR Negative 02/08/2015 2325   KETONESUR NEGATIVE 12/18/2023 1842   PROTEINUR 100 (A) 12/18/2023 1842   UROBILINOGEN 0.2 03/09/2011 2159   NITRITE NEGATIVE 12/18/2023 1842   LEUKOCYTESUR MODERATE (A) 12/18/2023 1842   LEUKOCYTESUR Negative 02/08/2015 2325   Sepsis Labs Recent Labs  Lab 12/20/23 1734 12/21/23 0145 12/21/23 0948 12/22/23 0620  WBC 18.1* 15.1* 13.2* 10.8*   Microbiology Recent Results (from the past 240 hours)  Culture, blood (Routine x 2)     Status: None (Preliminary result)  Collection Time: 12/18/23  6:10 PM   Specimen: BLOOD  Result Value Ref Range Status   Specimen Description BLOOD RIGHT ANTECUBITAL  Final   Special Requests   Final    BOTTLES DRAWN AEROBIC AND ANAEROBIC Blood Culture results may not be optimal due to an inadequate volume of blood received in culture bottles   Culture   Final    NO GROWTH 4 DAYS Performed at [facility], [address], [place], [place] [address]    Report Status PENDING  Incomplete  Culture, blood (Routine x 2)     Status: Abnormal   Collection Time: 12/18/23  6:42 PM   Specimen: BLOOD LEFT ARM  Result Value Ref Range Status   Specimen Description   Final    BLOOD LEFT ARM Performed at [facility], [address] [place], [place] [address]    Special Requests   Final    BOTTLES DRAWN AEROBIC AND ANAEROBIC Blood Culture results may not be optimal due to an inadequate volume of blood received in culture bottles Performed at [facility], [address], [place], [place] [address]    Culture  Setup Time   Final    GRAM POSITIVE COCCI ANAEROBIC BOTTLE ONLY Organism ID to follow CRITICAL RESULT  CALLED TO, READ BACK BY AND VERIFIED WITH: AMANDA CHOI 1810 12/19/23 MU    Culture (A)  Final    STAPHYLOCOCCUS HOMINIS THE SIGNIFICANCE OF ISOLATING THIS ORGANISM FROM A SINGLE SET OF BLOOD CULTURES WHEN MULTIPLE SETS ARE DRAWN IS UNCERTAIN. PLEASE NOTIFY THE MICROBIOLOGY DEPARTMENT WITHIN ONE WEEK IF SPECIATION AND SENSITIVITIES ARE REQUIRED. Performed at [facility], [address] [place], [place] [address]    Report Status 12/21/2023 FINAL  Final  Resp panel by RT-PCR (RSV, Flu A&B, Covid) Anterior Nasal Swab     Status: None   Collection Time: 12/18/23  6:42 PM   Specimen: Anterior Nasal Swab  Result Value Ref Range Status   SARS Coronavirus 2 by RT PCR NEGATIVE NEGATIVE Final    Comment: (NOTE) SARS-CoV-2 target nucleic acids are NOT DETECTED.  The SARS-CoV-2 RNA is generally detectable in upper respiratory specimens during the acute phase of infection. The lowest concentration of SARS-CoV-2 viral copies this assay can detect is 138 copies/mL. A negative result does not preclude SARS-Cov-2 infection and should not be used as the sole basis for treatment or other patient management decisions. A negative result may occur with  improper specimen collection/handling, submission of specimen other than nasopharyngeal swab, presence of viral mutation(s) within the areas targeted by this assay, and inadequate number of viral copies(<138 copies/mL). A negative result must be combined with clinical observations, patient history, and epidemiological information. The expected result is Negative.  Fact Sheet for Patients:  BloggerCourse.com  Fact Sheet for Healthcare Providers:  SeriousBroker.it  This test is no t yet approved or cleared by the [place] FDA and  has been authorized for detection and/or diagnosis of SARS-CoV-2 by FDA under an Emergency Use Authorization (EUA). This EUA will remain  in effect (meaning this test  can be used) for the duration of the COVID-19 declaration under Section 564(b)(1) of the Act, 21 U.S.C.section 360bbb-3(b)(1), unless the authorization is terminated  or revoked sooner.       Influenza A by PCR NEGATIVE NEGATIVE Final   Influenza B by PCR NEGATIVE NEGATIVE Final    Comment: (NOTE) The Xpert Xpress SARS-CoV-2/FLU/RSV plus assay is intended as an aid in the diagnosis  of influenza from Nasopharyngeal swab specimens and should not be used as a sole basis for treatment. Nasal washings and aspirates are unacceptable for Xpert Xpress SARS-CoV-2/FLU/RSV testing.  Fact Sheet for Patients: BloggerCourse.com  Fact Sheet for Healthcare Providers: SeriousBroker.it  This test is not yet approved or cleared by the [place] FDA and has been authorized for detection and/or diagnosis of SARS-CoV-2 by FDA under an Emergency Use Authorization (EUA). This EUA will remain in effect (meaning this test can be used) for the duration of the COVID-19 declaration under Section 564(b)(1) of the Act, 21 U.S.C. section 360bbb-3(b)(1), unless the authorization is terminated or revoked.     Resp Syncytial Virus by PCR NEGATIVE NEGATIVE Final    Comment: (NOTE) Fact Sheet for Patients: BloggerCourse.com  Fact Sheet for Healthcare Providers: SeriousBroker.it  This test is not yet approved or cleared by the [place] FDA and has been authorized for detection and/or diagnosis of SARS-CoV-2 by FDA under an Emergency Use Authorization (EUA). This EUA will remain in effect (meaning this test can be used) for the duration of the COVID-19 declaration under Section 564(b)(1) of the Act, 21 U.S.C. section 360bbb-3(b)(1), unless the authorization is terminated or revoked.  Performed at [facility], [address] [place], [place] [address]   Urine Culture     Status: Abnormal    Collection Time: 12/18/23  6:42 PM   Specimen: Urine, Random  Result Value Ref Range Status   Specimen Description   Final    URINE, RANDOM Performed at [facility], [address] [place], [place] [address]    Special Requests   Final    NONE Performed at [facility], [address] [place], [place] [address]    Culture >=100,000 COLONIES/mL ESCHERICHIA COLI (A)  Final   Report Status 12/21/2023 FINAL  Final   Organism ID, Bacteria ESCHERICHIA COLI (A)  Final      Susceptibility   Escherichia coli - MIC*    AMPICILLIN <=2 SENSITIVE Sensitive     CEFAZOLIN <=4 SENSITIVE Sensitive     CEFEPIME <=0.12 SENSITIVE Sensitive     CEFTRIAXONE <=0.25 SENSITIVE Sensitive     CIPROFLOXACIN <=0.25 SENSITIVE Sensitive     GENTAMICIN <=1 SENSITIVE Sensitive     IMIPENEM <=0.25 SENSITIVE Sensitive     NITROFURANTOIN <=16 SENSITIVE Sensitive     TRIMETH/SULFA <=20 SENSITIVE Sensitive     AMPICILLIN/SULBACTAM <=2 SENSITIVE Sensitive     PIP/TAZO <=4 SENSITIVE Sensitive ug/mL    * >=100,000 COLONIES/mL ESCHERICHIA COLI  Blood Culture ID Panel (Reflexed)     Status: Abnormal   Collection Time: 12/18/23  6:42 PM  Result Value Ref Range Status   Enterococcus faecalis NOT DETECTED NOT DETECTED Final   Enterococcus Faecium NOT DETECTED NOT DETECTED Final   Listeria monocytogenes NOT DETECTED NOT DETECTED Final   Staphylococcus species DETECTED (A) NOT DETECTED Final    Comment: CRITICAL RESULT CALLED TO, READ BACK BY AND VERIFIED WITH: AMANDA CHOI 1810 12/19/23 MU    Staphylococcus aureus (BCID) NOT DETECTED NOT DETECTED Final   Staphylococcus epidermidis NOT DETECTED NOT DETECTED Final   Staphylococcus lugdunensis NOT DETECTED NOT DETECTED Final   Streptococcus species NOT DETECTED NOT DETECTED Final   Streptococcus agalactiae NOT DETECTED NOT DETECTED Final   Streptococcus pneumoniae NOT DETECTED NOT DETECTED Final   Streptococcus pyogenes NOT DETECTED NOT DETECTED  Final   A.calcoaceticus-baumannii NOT DETECTED NOT DETECTED Final   Bacteroides fragilis NOT DETECTED NOT DETECTED Final  Enterobacterales NOT DETECTED NOT DETECTED Final   Enterobacter cloacae complex NOT DETECTED NOT DETECTED Final   Escherichia coli NOT DETECTED NOT DETECTED Final   Klebsiella aerogenes NOT DETECTED NOT DETECTED Final   Klebsiella oxytoca NOT DETECTED NOT DETECTED Final   Klebsiella pneumoniae NOT DETECTED NOT DETECTED Final   Proteus species NOT DETECTED NOT DETECTED Final   Salmonella species NOT DETECTED NOT DETECTED Final   Serratia marcescens NOT DETECTED NOT DETECTED Final   Haemophilus influenzae NOT DETECTED NOT DETECTED Final   Neisseria meningitidis NOT DETECTED NOT DETECTED Final   Pseudomonas aeruginosa NOT DETECTED NOT DETECTED Final   Stenotrophomonas maltophilia NOT DETECTED NOT DETECTED Final   Candida albicans NOT DETECTED NOT DETECTED Final   Candida auris NOT DETECTED NOT DETECTED Final   Candida glabrata NOT DETECTED NOT DETECTED Final   Candida krusei NOT DETECTED NOT DETECTED Final   Candida parapsilosis NOT DETECTED NOT DETECTED Final   Candida tropicalis NOT DETECTED NOT DETECTED Final   Cryptococcus neoformans/gattii NOT DETECTED NOT DETECTED Final    Comment: Performed at [facility], [address] [place], [place] [address]  MRSA Next Gen by PCR, Nasal     Status: None   Collection Time: 12/18/23 11:01 PM   Specimen: Nasal Mucosa; Nasal Swab  Result Value Ref Range Status   MRSA by PCR Next Gen NOT DETECTED NOT DETECTED Final    Comment: (NOTE) The GeneXpert MRSA Assay (FDA approved for NASAL specimens only), is one component of a comprehensive MRSA colonization surveillance program. It is not intended to diagnose MRSA infection nor to guide or monitor treatment for MRSA infections. Test performance is not FDA approved in patients less than 76 years old. Performed at [facility], [address],  [place], [place] [address]      Time coordinating discharge: Over 30 minutes  SIGNED:   Charise Killian, MD  [facility] 12/22/2023, 12:20 PM Pager   If 7PM-7AM, please contact night-coverage [email or web address]

## 2023-12-23 ENCOUNTER — Inpatient Hospital Stay: Admission: RE | Admit: 2023-12-23 | Payer: 59 | Source: Ambulatory Visit

## 2023-12-23 ENCOUNTER — Telehealth: Payer: Self-pay

## 2023-12-23 LAB — CULTURE, BLOOD (ROUTINE X 2): Culture: NO GROWTH

## 2023-12-23 NOTE — Transitions of Care (Post Inpatient/ED Visit) (Signed)
   12/23/2023  Name: Jessica Fields MRN: [id number] DOB: 1981-01-09  Today's TOC FU Call Status: Today's TOC FU Call Status:: Unsuccessful Call (1st Attempt) Unsuccessful Call (1st Attempt) Date: 12/23/23  Attempted to reach the patient regarding the most recent Inpatient/ED visit.  Follow Up Plan: Additional outreach attempts will be made to reach the patient to complete the Transitions of Care (Post Inpatient/ED visit) call.  Deidre Ala, BSN, RN [place]  VBCI - [employer] [facility] RN Care Manager [phone]

## 2023-12-26 ENCOUNTER — Ambulatory Visit
Admission: RE | Admit: 2023-12-26 | Discharge: 2023-12-26 | Disposition: A | Discharge status: Home / Self Care | Source: Ambulatory Visit | Attending: Obstetrics and Gynecology | Admitting: Obstetrics and Gynecology

## 2023-12-26 ENCOUNTER — Other Ambulatory Visit: Payer: Self-pay | Admitting: Family Medicine

## 2023-12-26 DIAGNOSIS — R1011 Right upper quadrant pain: Secondary | ICD-10-CM

## 2023-12-26 DIAGNOSIS — D509 Iron deficiency anemia, unspecified: Secondary | ICD-10-CM | POA: Insufficient documentation

## 2023-12-26 MED ORDER — IRON SUCROSE 300 MG IVPB - SIMPLE MED
300.0000 mg | Status: DC
  Administered 2023-12-26: 300 mg via INTRAVENOUS
  Filled 2023-12-26: qty 300

## 2023-12-27 ENCOUNTER — Encounter: Payer: Self-pay | Admitting: Family Medicine

## 2023-12-27 ENCOUNTER — Telehealth: Payer: Self-pay

## 2023-12-27 ENCOUNTER — Ambulatory Visit (INDEPENDENT_AMBULATORY_CARE_PROVIDER_SITE_OTHER): Admitting: Family Medicine

## 2023-12-27 VITALS — BP 120/60 | HR 96 | Resp 16 | Ht 66.0 in | Wt 333.6 lb

## 2023-12-27 DIAGNOSIS — B3731 Acute candidiasis of vulva and vagina: Secondary | ICD-10-CM

## 2023-12-27 DIAGNOSIS — R1900 Intra-abdominal and pelvic swelling, mass and lump, unspecified site: Secondary | ICD-10-CM

## 2023-12-27 DIAGNOSIS — N938 Other specified abnormal uterine and vaginal bleeding: Secondary | ICD-10-CM | POA: Diagnosis not present

## 2023-12-27 DIAGNOSIS — Z87440 Personal history of urinary (tract) infections: Secondary | ICD-10-CM | POA: Diagnosis not present

## 2023-12-27 DIAGNOSIS — N179 Acute kidney failure, unspecified: Secondary | ICD-10-CM | POA: Diagnosis not present

## 2023-12-27 DIAGNOSIS — D500 Iron deficiency anemia secondary to blood loss (chronic): Secondary | ICD-10-CM

## 2023-12-27 LAB — POCT URINALYSIS DIPSTICK
Bilirubin, UA: NEGATIVE
Glucose, UA: NEGATIVE
Ketones, UA: NEGATIVE
Leukocytes, UA: NEGATIVE
Nitrite, UA: NEGATIVE
Protein, UA: NEGATIVE
Spec Grav, UA: 1.025 (ref 1.010–1.025)
Urobilinogen, UA: 0.2 U/dL
pH, UA: 5 (ref 5.0–8.0)

## 2023-12-27 MED ORDER — FLUCONAZOLE 150 MG PO TABS: 150.0000 mg | ORAL_TABLET | Freq: Once | ORAL | 0 refills | Status: AC

## 2023-12-27 MED ORDER — PHENAZOPYRIDINE HCL 200 MG PO TABS: 200.0000 mg | ORAL_TABLET | Freq: Three times a day (TID) | ORAL | 0 refills | Status: DC | PRN

## 2023-12-27 MED ORDER — OXYCODONE-ACETAMINOPHEN 7.5-325 MG PO TABS
2.0000 | ORAL_TABLET | Freq: Four times a day (QID) | ORAL | 0 refills | Status: DC | PRN
Start: 2023-12-27 — End: 2024-01-12

## 2023-12-27 NOTE — Progress Notes (Signed)
 Established patient visit   Patient: Jessica Fields   DOB: 1981/06/23   43 y.o. Female  MRN: [id number] Visit Date: 12/27/2023  Today's healthcare provider: Mila Merry, MD   Chief Complaint  Patient presents with   Hospitalization Follow-up    Patient was admitted to [facility] on 12/18/2023 and discharged on 12/22/2023. She was treated for Sepsis due to Urinary Tract Infection. Telephone follow up was done on 12/23/2023    Subjective    HPI Follow up hospitalization 12/18/2023 initially presented for lower abdominal pain and found to have large pelvic mass, subsequently developed sepsis secondary to UTI. Was seen by gyn while in hospital and plan on outpatient follow up pelvic mass which was thought to be unrelated to her pain and sepsis. Treated for AKI and discharged on antibiotic, has 2-3 days left. Also reports sx of yeast infection since discharge. Has also been started on iron infusions for IDA secondary to DUB. She reports bleeding had stopped for several days, but restarted very lightly the last couple of days.   Lab Results  Component Value Date   NA 139 12/22/2023   K 3.7 12/22/2023   CREATININE 0.93 12/22/2023   GFRNONAA >60 12/22/2023   GLUCOSE 111 (H) 12/22/2023   Lab Results  Component Value Date   WBC 10.8 (H) 12/22/2023   HGB 8.6 (L) 12/22/2023   HCT 26.6 (L) 12/22/2023   MCV 78.0 (L) 12/22/2023   PLT 453 (H) 12/22/2023     Medications: Outpatient Medications Prior to Visit  Medication Sig   amLODipine (NORVASC) 10 MG tablet TAKE 1 TABLET(10 MG) BY MOUTH DAILY   celecoxib (CELEBREX) 200 MG capsule TAKE 1 CAPSULE BY MOUTH TWICE DAILY   ciprofloxacin (CIPRO) 500 MG tablet Take 1 tablet (500 mg total) by mouth 2 (two) times daily for 7 days.   diclofenac sodium (VOLTAREN) 1 % GEL Apply 4 g topically 4 (four) times daily as needed.   fluticasone (FLONASE) 50 MCG/ACT nasal spray Place 2 sprays into both nostrils daily as needed.    lisinopril-hydrochlorothiazide (ZESTORETIC) 10-12.5 MG tablet TAKE 1 TABLET BY MOUTH DAILY   Melatonin 10 MG TABS Take 10 mg by mouth at bedtime as needed.   metFORMIN (GLUCOPHAGE) 500 MG tablet TAKE 1 TABLET(500 MG) BY MOUTH THREE TIMES DAILY WITH MEALS   omeprazole (PRILOSEC) 20 MG capsule TAKE 1 CAPSULE(20 MG) BY MOUTH DAILY   oxyCODONE-acetaminophen (PERCOCET) 7.5-325 MG tablet Take 2 tablets by mouth every 6 (six) hours as needed for severe pain (pain score 7-10).   sertraline (ZOLOFT) 50 MG tablet TAKE 1 TABLET(50 MG) BY MOUTH DAILY   tirzepatide (MOUNJARO) 10 MG/0.5ML Pen Inject 10 mg into the skin once a week.   Facility-Administered Medications Prior to Visit  Medication Dose Route Frequency Provider   iron sucrose (VENOFER) 300 mg in sodium chloride 0.9 % 250 mL IVPB  300 mg Intravenous Weekly Schermerhorn, Ihor Austin, MD    Review of Systems  Constitutional:  Negative for appetite change, chills, fatigue and fever.  Respiratory:  Negative for chest tightness and shortness of breath.   Cardiovascular:  Negative for chest pain and palpitations.  Gastrointestinal:  Negative for abdominal pain, nausea and vomiting.  Neurological:  Negative for dizziness and weakness.       Objective    BP 120/60 (BP Location: Left Arm, Patient Position: Sitting, Cuff Size: Large)   Pulse 96   Resp 16   Ht 5\' 6"  (1.676 m)  Wt (!) 333 lb 9.6 oz (151.3 kg)   SpO2 97%   BMI 53.84 kg/m    Physical Exam   General appearance: Obese female, cooperative and in no acute distress Head: Normocephalic, without obvious abnormality, atraumatic Respiratory: Respirations even and unlabored, normal respiratory rate Extremities: All extremities are intact.  Skin: Skin color, texture, turgor normal. No rashes seen  Psych: Appropriate mood and affect. Neurologic: Mental status: Alert, oriented to person, place, and time, thought content appropriate.   Results for orders placed or performed in visit on  12/27/23  POCT urinalysis dipstick  Result Value Ref Range   Color, UA Dark Yellow    Clarity, UA Cloudy    Glucose, UA Negative Negative   Bilirubin, UA Negative    Ketones, UA Negative    Spec Grav, UA 1.025 1.010 - 1.025   Blood, UA Large    pH, UA 5.0 5.0 - 8.0   Protein, UA Negative Negative   Urobilinogen, UA 0.2 0.2 or 1.0 E.U./dL   Nitrite, UA Negative    Leukocytes, UA Negative Negative   Appearance     Odor      Assessment & Plan     1. Hx of urinary infection (Primary) On last couple days of antibiotic. Still with some pelvic pain. Sent prescription for pyridium.  - Urine Culture - Urine Microscopic  2. AKI (acute kidney injury) (HCC) Secondary to urosepsis.   - Renal function panel - Urine Culture - Urine Microscopic  3. Yeast vaginitis  - fluconazole (DIFLUCAN) 150 MG tablet; Take 1 tablet (150 mg total) by mouth once for 1 dose. Repeat after 5 days if needed  Dispense: 2 tablet; Refill: 0  4. DUB (dysfunctional uterine bleeding)  5. Pelvic mass Follow up gyn as scheduled.   6. Iron deficiency anemia due to chronic blood loss Follow up hematology regarding iron infusions as scheduled.         Mila Merry, MD  [facility] [phone] (phone) [phone] (fax)  [facility]

## 2023-12-27 NOTE — Transitions of Care (Post Inpatient/ED Visit) (Signed)
   12/27/2023  Name: Jessica Fields MRN: [id number] DOB: 1981/06/30  Today's TOC FU Call Status: Today's TOC FU Call Status:: Unsuccessful Call (3rd Attempt) Unsuccessful Call (3rd Attempt) Date: 12/27/23  Attempted to reach the patient regarding the most recent Inpatient/ED visit.  Follow Up Plan: No further outreach attempts will be made at this time. We have been unable to contact the patient.  Deidre Ala, BSN, RN [place]  [facility] - [employer] [facility] RN Care Manager [phone]

## 2023-12-28 LAB — URINALYSIS, MICROSCOPIC ONLY

## 2023-12-29 ENCOUNTER — Encounter: Payer: Self-pay | Admitting: Family Medicine

## 2023-12-29 LAB — URINE CULTURE

## 2024-01-03 ENCOUNTER — Inpatient Hospital Stay
Admission: RE | Admit: 2024-01-03 | Discharge: 2024-01-03 | Disposition: A | Discharge status: Home / Self Care | Source: Ambulatory Visit

## 2024-01-04 ENCOUNTER — Ambulatory Visit
Admission: RE | Admit: 2024-01-04 | Discharge: 2024-01-04 | Disposition: A | Discharge status: Home / Self Care | Source: Ambulatory Visit | Attending: Obstetrics and Gynecology | Admitting: Obstetrics and Gynecology

## 2024-01-04 ENCOUNTER — Other Ambulatory Visit: Payer: Self-pay | Admitting: Family Medicine

## 2024-01-04 DIAGNOSIS — I10 Essential (primary) hypertension: Secondary | ICD-10-CM

## 2024-01-04 DIAGNOSIS — F439 Reaction to severe stress, unspecified: Secondary | ICD-10-CM

## 2024-01-04 DIAGNOSIS — D509 Iron deficiency anemia, unspecified: Secondary | ICD-10-CM | POA: Insufficient documentation

## 2024-01-04 MED ORDER — IRON SUCROSE 300 MG IVPB - SIMPLE MED
300.0000 mg | Status: DC
  Administered 2024-01-04: 300 mg via INTRAVENOUS
  Filled 2024-01-04: qty 300

## 2024-01-12 ENCOUNTER — Other Ambulatory Visit: Payer: Self-pay | Admitting: Family Medicine

## 2024-01-12 DIAGNOSIS — R1011 Right upper quadrant pain: Secondary | ICD-10-CM

## 2024-01-12 MED ORDER — OXYCODONE-ACETAMINOPHEN 7.5-325 MG PO TABS: 2.0000 | ORAL_TABLET | Freq: Four times a day (QID) | ORAL | 0 refills | Status: DC | PRN

## 2024-01-23 ENCOUNTER — Ambulatory Visit (INDEPENDENT_AMBULATORY_CARE_PROVIDER_SITE_OTHER): Admitting: Family Medicine

## 2024-01-23 VITALS — BP 126/70 | HR 94 | Ht 66.0 in | Wt 340.3 lb

## 2024-01-23 DIAGNOSIS — N390 Urinary tract infection, site not specified: Secondary | ICD-10-CM

## 2024-01-23 DIAGNOSIS — Z87440 Personal history of urinary (tract) infections: Secondary | ICD-10-CM

## 2024-01-23 DIAGNOSIS — R5383 Other fatigue: Secondary | ICD-10-CM

## 2024-01-23 DIAGNOSIS — R339 Retention of urine, unspecified: Secondary | ICD-10-CM

## 2024-01-23 DIAGNOSIS — E639 Nutritional deficiency, unspecified: Secondary | ICD-10-CM

## 2024-01-23 DIAGNOSIS — G4733 Obstructive sleep apnea (adult) (pediatric): Secondary | ICD-10-CM

## 2024-01-23 LAB — POCT URINALYSIS DIPSTICK
Bilirubin, UA: NEGATIVE
Glucose, UA: NEGATIVE
Ketones, UA: NEGATIVE
Nitrite, UA: NEGATIVE
Protein, UA: NEGATIVE
Spec Grav, UA: 1.015 (ref 1.010–1.025)
Urobilinogen, UA: 0.2 U/dL
pH, UA: 6 (ref 5.0–8.0)

## 2024-01-23 NOTE — Progress Notes (Unsigned)
 Established patient visit   Patient: Jessica Fields   DOB: 27-Jan-1981   43 y.o. Female  MRN: [id number] Visit Date: 01/23/2024  Today's healthcare provider: Mila Merry, MD   Chief Complaint  Patient presents with   Bladder Concerns    1 month ago sepsis for severe uti. Does not feel like things are getting any better. Patient reports anytime she has to go to the bathroom she feels it like pressure. When sitting down she does not feel it but every time she stands she has to brace herself for pain. Dscribes pain like a constant urge and feels as if she is not emptying bladder completely.    Fatigue    Fatigue X 1 month since leaving hospital. Reports if she is not at work she is at home sleeping.   Subjective    Discussed the use of AI scribe software for clinical note transcription with the patient, who gave verbal consent to proceed.  History of Present Illness   Jessica Fields is a 43 year old female who presents with ongoing fatigue and bladder issues.  she was hospitalized with sepsis secondar to UTI in March and has had ongoing probably with fatigue and painful, incomplete urination since then.   She experiences bladder pain, describing a sensation of fullness and pain upon movement or when the bladder starts filling. She has difficulty emptying her bladder completely, needing to contract multiple times to fully void. These symptoms began after her hospitalization for a urinary tract infection and sepsis.  She describes significant fatigue, stating she sleeps excessively and feels overwhelmingly tired despite adequate sleep. She uses a CPAP machine nightly, which she has had since 2006, with the current machine being about seven or 45 years old. She has a long-standing diagnosis of obstructive sleep apnea managed with CPAP, which she uses nightly.  She mentions experiencing intermittent bleeding, which had stopped for about three weeks but started again lightly today,  accompanied by mild period-like pain.  She had been on iron supplements initially but was switched to iron transfusions, having completed three transfusions, the last one being two weeks ago. She has not had any blood work done since the transfusions.  She has not resumed taking Mounjaro since her hospital discharge, expressing apprehension about restarting it. Her blood sugar readings have been stable, ranging from 115 to 120. No changes in her other medications.     She also has iron deficiency anemia secondary to DUB and had started iron transfusion just before her hospitalization. She has since completed transfusions about 3 weeks ago. She is not currently taking any oral iron supplements.   Last CBC Lab Results  Component Value Date   WBC 10.8 (H) 12/22/2023   HGB 8.6 (L) 12/22/2023   HCT 26.6 (L) 12/22/2023   MCV 78.0 (L) 12/22/2023   MCH 25.2 (L) 12/22/2023   RDW 16.8 (H) 12/22/2023   PLT 453 (H) 12/22/2023   Lab Results  Component Value Date   IRON 16 (L) 12/09/2023   TIBC 227 (L) 12/09/2023     Medications: Outpatient Medications Prior to Visit  Medication Sig   amLODipine (NORVASC) 10 MG tablet TAKE 1 TABLET(10 MG) BY MOUTH DAILY   celecoxib (CELEBREX) 200 MG capsule TAKE 1 CAPSULE BY MOUTH TWICE DAILY   diclofenac sodium (VOLTAREN) 1 % GEL Apply 4 g topically 4 (four) times daily as needed.   fluticasone (FLONASE) 50 MCG/ACT nasal spray Place 2 sprays into both nostrils  daily as needed.   lisinopril-hydrochlorothiazide (ZESTORETIC) 10-12.5 MG tablet TAKE 1 TABLET BY MOUTH DAILY   Melatonin 10 MG TABS Take 10 mg by mouth at bedtime as needed.   metFORMIN (GLUCOPHAGE) 500 MG tablet TAKE 1 TABLET(500 MG) BY MOUTH THREE TIMES DAILY WITH MEALS   omeprazole (PRILOSEC) 20 MG capsule TAKE 1 CAPSULE(20 MG) BY MOUTH DAILY   oxyCODONE-acetaminophen (PERCOCET) 7.5-325 MG tablet Take 2 tablets by mouth every 6 (six) hours as needed for severe pain (pain score 7-10).    phenazopyridine (PYRIDIUM) 200 MG tablet Take 1 tablet (200 mg total) by mouth 3 (three) times daily as needed for pain.   sertraline (ZOLOFT) 50 MG tablet TAKE 1 TABLET(50 MG) BY MOUTH DAILY   tirzepatide (MOUNJARO) 10 MG/0.5ML Pen Inject 10 mg into the skin once a week.   No facility-administered medications prior to visit.   Review of Systems {Insert previous labs (optional):23779} {See past labs  Heme  Chem  Endocrine  Serology  Results Review (optional):1}   Objective    BP 126/70 (BP Location: Right Arm, Patient Position: Sitting, Cuff Size: Large)   Pulse 94   Ht 5\' 6"  (1.676 m)   Wt (!) 340 lb 4.8 oz (154.4 kg)   BMI 54.93 kg/m {Insert last BP/Wt (optional):23777}{See vitals history (optional):1}  Physical Exam   General appearance: Severely obese female, cooperative and in no acute distress Head: Normocephalic, without obvious abnormality, atraumatic Respiratory: Respirations even and unlabored, normal respiratory rate Extremities: All extremities are intact.  Skin: Skin color, texture, turgor normal. No rashes seen  Psych: Appropriate mood and affect. Neurologic: Mental status: Alert, oriented to person, place, and time, thought content appropriate.   No results found for any visits on 01/23/24.     Assessment & Plan        Bladder Dysfunction Persistent bladder dysfunction with possible mild infection. Requires urologist evaluation and urodynamic studies. - Refer to urologist for bladder function evaluation. - Send urine for culture. - Consider antibiotics if culture confirms infection.  Iron Deficiency Anemia Persistent fatigue despite iron transfusions. Blood counts, iron, and B12 levels need assessment. - Order blood tests for blood counts and iron levels. - Check B12 levels.  Obstructive Sleep Apnea Managed with CPAP. Persistent fatigue may relate to anemia or sleep apnea. Consider Io/n oximetry to verify adequate response to CPAP.   Obesity/type 2  diabetes Advised weight loss for surgical outcomes. Plans to resume Mounjaro cautiously. - Encourage weight loss. - Resume Mounjaro with caution, starting with sample 2.5mg  injection twice a week x 4, then resume 10mg  weekly.          Mila Merry, MD  [facility] [phone] (phone) [phone] (fax)  [facility]

## 2024-01-24 ENCOUNTER — Other Ambulatory Visit: Payer: Self-pay | Admitting: Family Medicine

## 2024-01-24 ENCOUNTER — Encounter: Payer: Self-pay | Admitting: Family Medicine

## 2024-01-24 DIAGNOSIS — D500 Iron deficiency anemia secondary to blood loss (chronic): Secondary | ICD-10-CM

## 2024-01-24 LAB — CBC
Hematocrit: 25 % — ABNORMAL LOW (ref 34.0–46.6)
Hemoglobin: 7.8 g/dL — ABNORMAL LOW (ref 11.1–15.9)
MCH: 24.2 pg — ABNORMAL LOW (ref 26.6–33.0)
MCHC: 31.2 g/dL — ABNORMAL LOW (ref 31.5–35.7)
MCV: 78 fL — ABNORMAL LOW (ref 79–97)
Platelets: 458 10*3/uL — ABNORMAL HIGH (ref 150–450)
RBC: 3.22 x10E6/uL — ABNORMAL LOW (ref 3.77–5.28)
RDW: 17.1 % — ABNORMAL HIGH (ref 11.7–15.4)
WBC: 11.2 10*3/uL — ABNORMAL HIGH (ref 3.4–10.8)

## 2024-01-24 LAB — COMPREHENSIVE METABOLIC PANEL WITH GFR
ALT: 5 IU/L (ref 0–32)
AST: 10 IU/L (ref 0–40)
Albumin: 3.5 g/dL — ABNORMAL LOW (ref 3.9–4.9)
Alkaline Phosphatase: 125 IU/L — ABNORMAL HIGH (ref 44–121)
BUN/Creatinine Ratio: 12 (ref 9–23)
BUN: 15 mg/dL (ref 6–24)
Bilirubin Total: 0.2 mg/dL (ref 0.0–1.2)
CO2: 24 mmol/L (ref 20–29)
Calcium: 9.1 mg/dL (ref 8.7–10.2)
Chloride: 98 mmol/L (ref 96–106)
Creatinine, Ser: 1.3 mg/dL — ABNORMAL HIGH (ref 0.57–1.00)
Globulin, Total: 4.4 g/dL (ref 1.5–4.5)
Glucose: 104 mg/dL — ABNORMAL HIGH (ref 70–99)
Potassium: 4.5 mmol/L (ref 3.5–5.2)
Sodium: 139 mmol/L (ref 134–144)
Total Protein: 7.9 g/dL (ref 6.0–8.5)
eGFR: 52 mL/min/{1.73_m2} — ABNORMAL LOW (ref >59)

## 2024-01-24 LAB — IRON,TIBC AND FERRITIN PANEL
Ferritin: 481 ng/mL — ABNORMAL HIGH (ref 15–150)
Iron Saturation: 9 % — CL (ref 15–55)
Iron: 21 ug/dL — ABNORMAL LOW (ref 27–159)
Total Iron Binding Capacity: 239 ug/dL — ABNORMAL LOW (ref 250–450)
UIBC: 218 ug/dL (ref 131–425)

## 2024-01-24 LAB — VITAMIN B12: Vitamin B-12: 449 pg/mL (ref 232–1245)

## 2024-01-25 ENCOUNTER — Other Ambulatory Visit: Payer: Self-pay | Admitting: Family Medicine

## 2024-01-25 DIAGNOSIS — R1011 Right upper quadrant pain: Secondary | ICD-10-CM

## 2024-01-26 LAB — CULTURE, URINE COMPREHENSIVE

## 2024-01-26 MED ORDER — OXYCODONE-ACETAMINOPHEN 7.5-325 MG PO TABS: 2.0000 | ORAL_TABLET | Freq: Four times a day (QID) | ORAL | 0 refills | Status: DC | PRN

## 2024-01-27 ENCOUNTER — Encounter: Payer: Self-pay | Admitting: Family Medicine

## 2024-01-27 ENCOUNTER — Other Ambulatory Visit: Payer: Self-pay | Admitting: Family Medicine

## 2024-01-27 DIAGNOSIS — Z87440 Personal history of urinary (tract) infections: Secondary | ICD-10-CM

## 2024-01-27 MED ORDER — CEFDINIR 300 MG PO CAPS: 600.0000 mg | ORAL_CAPSULE | Freq: Every day | ORAL | 0 refills | Status: AC

## 2024-02-02 ENCOUNTER — Inpatient Hospital Stay: Attending: Oncology | Admitting: Oncology

## 2024-02-02 ENCOUNTER — Inpatient Hospital Stay

## 2024-02-02 ENCOUNTER — Encounter: Payer: Self-pay | Admitting: Oncology

## 2024-02-02 VITALS — HR 100 | Temp 97.6°F | Resp 18 | Wt 339.8 lb

## 2024-02-02 DIAGNOSIS — D509 Iron deficiency anemia, unspecified: Secondary | ICD-10-CM

## 2024-02-02 DIAGNOSIS — D508 Other iron deficiency anemias: Secondary | ICD-10-CM | POA: Diagnosis not present

## 2024-02-02 DIAGNOSIS — Z975 Presence of (intrauterine) contraceptive device: Secondary | ICD-10-CM | POA: Diagnosis not present

## 2024-02-02 DIAGNOSIS — N183 Chronic kidney disease, stage 3 unspecified: Secondary | ICD-10-CM | POA: Diagnosis not present

## 2024-02-02 DIAGNOSIS — Z79899 Other long term (current) drug therapy: Secondary | ICD-10-CM | POA: Insufficient documentation

## 2024-02-02 DIAGNOSIS — R1900 Intra-abdominal and pelvic swelling, mass and lump, unspecified site: Secondary | ICD-10-CM | POA: Diagnosis not present

## 2024-02-02 DIAGNOSIS — N1831 Chronic kidney disease, stage 3a: Secondary | ICD-10-CM

## 2024-02-02 DIAGNOSIS — Z7984 Long term (current) use of oral hypoglycemic drugs: Secondary | ICD-10-CM | POA: Insufficient documentation

## 2024-02-02 DIAGNOSIS — E1122 Type 2 diabetes mellitus with diabetic chronic kidney disease: Secondary | ICD-10-CM | POA: Insufficient documentation

## 2024-02-02 DIAGNOSIS — D75839 Thrombocytosis, unspecified: Secondary | ICD-10-CM | POA: Diagnosis not present

## 2024-02-02 DIAGNOSIS — Z7985 Long-term (current) use of injectable non-insulin antidiabetic drugs: Secondary | ICD-10-CM | POA: Insufficient documentation

## 2024-02-02 DIAGNOSIS — N83201 Unspecified ovarian cyst, right side: Secondary | ICD-10-CM | POA: Diagnosis not present

## 2024-02-02 LAB — RETIC PANEL
Immature Retic Fract: 14.3 % (ref 2.3–15.9)
RBC.: 3.09 MIL/uL — ABNORMAL LOW (ref 3.87–5.11)
Retic Count, Absolute: 50.4 10*3/uL (ref 19.0–186.0)
Retic Ct Pct: 1.6 % (ref 0.4–3.1)
Reticulocyte Hemoglobin: 23.5 pg — ABNORMAL LOW

## 2024-02-02 LAB — SAMPLE TO BLOOD BANK

## 2024-02-02 LAB — CBC WITH DIFFERENTIAL/PLATELET
Abs Immature Granulocytes: 0.04 10*3/uL (ref 0.00–0.07)
Basophils Absolute: 0.1 10*3/uL (ref 0.0–0.1)
Basophils Relative: 1 %
Eosinophils Absolute: 0.3 10*3/uL (ref 0.0–0.5)
Eosinophils Relative: 3 %
HCT: 24.9 % — ABNORMAL LOW (ref 36.0–46.0)
Hemoglobin: 7.3 g/dL — ABNORMAL LOW (ref 12.0–15.0)
Immature Granulocytes: 0 %
Lymphocytes Relative: 18 %
Lymphs Abs: 1.7 10*3/uL (ref 0.7–4.0)
MCH: 23.8 pg — ABNORMAL LOW (ref 26.0–34.0)
MCHC: 29.3 g/dL — ABNORMAL LOW (ref 30.0–36.0)
MCV: 81.1 fL (ref 80.0–100.0)
Monocytes Absolute: 0.8 10*3/uL (ref 0.1–1.0)
Monocytes Relative: 8 %
Neutro Abs: 6.7 10*3/uL (ref 1.7–7.7)
Neutrophils Relative %: 70 %
Platelets: 410 10*3/uL — ABNORMAL HIGH (ref 150–400)
RBC: 3.07 MIL/uL — ABNORMAL LOW (ref 3.87–5.11)
RDW: 17.8 % — ABNORMAL HIGH (ref 11.5–15.5)
WBC: 9.6 10*3/uL (ref 4.0–10.5)
nRBC: 0 % (ref 0.0–0.2)

## 2024-02-02 LAB — IRON AND TIBC
Iron: 22 ug/dL — ABNORMAL LOW (ref 28–170)
Saturation Ratios: 9 % — ABNORMAL LOW (ref 10.4–31.8)
TIBC: 249 ug/dL — ABNORMAL LOW (ref 250–450)
UIBC: 227 ug/dL

## 2024-02-02 LAB — LACTATE DEHYDROGENASE: LDH: 93 U/L — ABNORMAL LOW (ref 98–192)

## 2024-02-02 LAB — FOLATE: Folate: 8.7 ng/mL (ref >5.9)

## 2024-02-02 LAB — FERRITIN: Ferritin: 238 ng/mL (ref 11–307)

## 2024-02-02 NOTE — Assessment & Plan Note (Signed)
 Possible hemorrhagic ovarian cyst. Recommend patient to continue follow-up with gynecology oncology Dr. Randalyn Bushman

## 2024-02-02 NOTE — Assessment & Plan Note (Signed)
 New onset since February 2025.  Likely reactive to inflammation or iron deficiency. Monitor counts.

## 2024-02-02 NOTE — Progress Notes (Addendum)
 Hematology/Oncology Consult note [phone] [phone]        REFERRING PROVIDER: Lamon Pillow, MD   CHIEF COMPLAINTS/REASON FOR VISIT:  Evaluation of iron  deficiency anemia    ASSESSMENT & PLAN:   Iron  deficiency anemia Patient has new onset of microcytic anemia since September 2024. Decreased iron  saturation, decreased reticulocyte hemoglobin level, indicating iron  deficiency. Ferritin level is not typical.  Possibly secondary to underlying inflammation or recent IV iron  treatments.  Recommend additional IV Feraheme weekly x 2.  She has previously tolerated well.  Rationale potential side effects were reviewed with patient.  There may be an component of anemia in CKD. Possible need for erythropoietin replacement therapy if hemoglobin remains low despite improved iron  stores.  Etiology of iron  deficiency, unknown.  Recommend patient have gastroenterology evaluation. Hemorrhagic ovarian cyst may also attribute to the iron  deficiency.  Addendum: Kenny Peals is non preferred medication for her insurance. I recommend IV venofer  weekly x 4. During her visit on 02/02/2024, I have discussed the possibility of change to venofer  due to insurance and discussed the rationale and possible side effects of Venofer . She has agreed with the plan.   Pelvic mass Possible hemorrhagic ovarian cyst. Recommend patient to continue follow-up with gynecology oncology Dr. Randalyn Bushman  CKD (chronic kidney disease) stage 3, GFR 30-59 ml/min (HCC) Encourage oral hydration and avoid nephrotoxins.  Check protein electrophoresis   Thrombocytosis New onset since February 2025.  Likely reactive to inflammation or iron  deficiency. Monitor counts.    Orders Placed This Encounter  Procedures   Ferritin    Standing Status:   Future    Number of Occurrences:   1    Expected Date:   02/02/2024    Expiration Date:   08/03/2024   Iron  and TIBC    Standing Status:   Future    Number of  Occurrences:   1    Expected Date:   02/02/2024    Expiration Date:   02/01/2025   CBC with Differential/Platelet    Standing Status:   Future    Number of Occurrences:   1    Expected Date:   02/02/2024    Expiration Date:   02/01/2025   Retic Panel    Standing Status:   Future    Number of Occurrences:   1    Expected Date:   02/02/2024    Expiration Date:   02/01/2025   Folate    Standing Status:   Future    Number of Occurrences:   1    Expected Date:   02/02/2024    Expiration Date:   02/01/2025   Lactate dehydrogenase    Standing Status:   Future    Number of Occurrences:   1    Expected Date:   02/02/2024    Expiration Date:   02/01/2025   Protein electrophoresis, serum    Standing Status:   Future    Number of Occurrences:   1    Expected Date:   02/02/2024    Expiration Date:   02/01/2025   Miscellaneous [facility] test (send-out)    Standing Status:   Future    Number of Occurrences:   1    Expected Date:   02/02/2024    Expiration Date:   02/01/2025    Test name / description::   Soluble transferrin receptor [facility] [id number]   Hold Tube- Blood Bank    Standing Status:   Future    Number of Occurrences:  1    Expected Date:   02/02/2024    Expiration Date:   02/01/2025   Follow-up in 4 weeks. All questions were answered. The patient knows to call the clinic with any problems, questions or concerns.  Timmy Forbes, MD, PhD [facility] Hematology Oncology 02/02/2024   HISTORY OF PRESENTING ILLNESS:   Jessica Fields is a  43 y.o.  female with PMH listed below was seen in consultation at the request of  Lamon Pillow, MD  for evaluation of iron  deficiency anemia  12/09/2023, patient presented to ER for evaluation of abdominal pain. Ultrasound pelvis showed 9.3 x 9.1 x 8.7 cm mildly complex cystic lesion. MRI pelvis with and without contrast showed 1. At the posterior aspect of the uterine fundus and distorting the contour of the uterus, there is a large, septated lesion measuring  10.6 x 10.2 x 9.2 cm. 2. Normal left ovarian tissue is not clearly visualized. 3. Normal right ovary with a small, benign, functional cyst requiring no specific further follow-up or characterization. 4. IUD in the endometrial cavity. Non degenerated uterine fibroids which distort and almost completely efface endometrial cavity.  12/09/2023, patient was found to have a hemoglobin of 8.2, his level has decreased from her baseline of 10.9 in September 2024.  MCV was 76.2.  She also had a white count of 17.9.  Platelet count of 5 3 7. Iron  panel showed decreased iron  saturation to 7, TIBC 227.  No ferritin was checked.  Patient was seen by gynecologist for pelvic mass evaluation. Patient reports that her gynecologist arrange her to get 3 IV Feraheme treatments on 12/16/2023, 12/26/2023 and 01/04/2024.  12/18/2023 - 12/22/2023, patient was admitted due to sepsis secondary to urinary tract infection.  She initially required vasopressors.  Patient was treated with IV antibiotics and discharged on 12/22/2023.  Her hemoglobin was low during this hospitalization.  She reports having 3 units of PRBC transfusions  She was seen by Dr. Randalyn Bushman during her admission and followed up outpatient at [facility].  Dr. Randalyn Bushman recommended observation and repeat imaging in July 2025.   She has a history of an IUD and reports only light menstrual periods occasionally. No stomach pain, changes in bowel habits, or blood in the stool. Her medications include Celebrex  for arthritis, omeprazole  for acid reflux, metformin  for diabetes, and Zoloft  for mild depression and anxiety.  MEDICAL HISTORY:  Past Medical History:  Diagnosis Date   History of chicken pox    History of cholecystitis     SURGICAL HISTORY: Past Surgical History:  Procedure Laterality Date   Sleep Study  01/25/2005   Performed at [facility] by Dr. Clive Dancer. Interpretation: very severe Sleep Apnea with nocturnal desaturations. She was improved with nasal Bi-PAP,  although was not titrated to optimal pressure. I suspect she has nasal sinus obstruction or congestion    SOCIAL HISTORY: Social History   Socioeconomic History   Marital status: Married    Spouse name: Not on file   Number of children: 0   Years of education: Not on file   Highest education level: Some college, no degree  Occupational History   Occupation: Transport planner  Tobacco Use   Smoking status: Never   Smokeless tobacco: Never  Substance and Sexual Activity   Alcohol use: Yes    Alcohol/week: 0.0 standard drinks of alcohol    Comment: occasional use   Drug use: No   Sexual activity: Not on file  Other Topics Concern   Not on  file  Social History Narrative   Not on file   Social Drivers of Health   Financial Resource Strain: Low Risk  (01/23/2024)   Overall Financial Resource Strain (CARDIA)    Difficulty of Paying Living Expenses: Not very hard  Food Insecurity: No Food Insecurity (01/23/2024)   Hunger Vital Sign    Worried About Running Out of Food in the Last Year: Never true    Ran Out of Food in the Last Year: Never true  Transportation Needs: No Transportation Needs (01/23/2024)   PRAPARE - Administrator, Civil Service (Medical): No    Lack of Transportation (Non-Medical): No  Physical Activity: Insufficiently Active (01/23/2024)   Exercise Vital Sign    Days of Exercise per Week: 2 days    Minutes of Exercise per Session: 20 min  Stress: No Stress Concern Present (01/23/2024)   [employer] of Occupational Health - Occupational Stress Questionnaire    Feeling of Stress : Only a little  Social Connections: Moderately Isolated (01/23/2024)   Social Connection and Isolation Panel [NHANES]    Frequency of Communication with Friends and Family: More than three times a week    Frequency of Social Gatherings with Friends and Family: Once a week    Attends Religious Services: Never    Database administrator or Organizations: No    Attends Museum/gallery exhibitions officer: Not on file    Marital Status: Married  Catering manager Violence: Not At Risk (12/18/2023)   Humiliation, Afraid, Rape, and Kick questionnaire    Fear of Current or Ex-Partner: No    Emotionally Abused: No    Physically Abused: No    Sexually Abused: No    FAMILY HISTORY: Family History  Problem Relation Age of Onset   Anemia Mother    Anemia Other    Breast cancer Neg Hx    Colon cancer Neg Hx     ALLERGIES:  is allergic to penicillins.  MEDICATIONS:  Current Outpatient Medications  Medication Sig Dispense Refill   amLODipine  (NORVASC ) 10 MG tablet TAKE 1 TABLET(10 MG) BY MOUTH DAILY 90 tablet 1   cefdinir  (OMNICEF ) 300 MG capsule Take 2 capsules (600 mg total) by mouth daily for 7 days. 14 capsule 0   celecoxib  (CELEBREX ) 200 MG capsule TAKE 1 CAPSULE BY MOUTH TWICE DAILY 180 capsule 1   diclofenac  sodium (VOLTAREN ) 1 % GEL Apply 4 g topically 4 (four) times daily as needed. 100 g 5   fluticasone (FLONASE) 50 MCG/ACT nasal spray Place 2 sprays into both nostrils daily as needed.     lisinopril -hydrochlorothiazide  (ZESTORETIC ) 10-12.5 MG tablet TAKE 1 TABLET BY MOUTH DAILY 90 tablet 0   Melatonin 10 MG TABS Take 10 mg by mouth at bedtime as needed.     metFORMIN  (GLUCOPHAGE ) 500 MG tablet TAKE 1 TABLET(500 MG) BY MOUTH THREE TIMES DAILY WITH MEALS 270 tablet 4   omeprazole  (PRILOSEC) 20 MG capsule TAKE 1 CAPSULE(20 MG) BY MOUTH DAILY 90 capsule 0   oxyCODONE -acetaminophen  (PERCOCET) 7.5-325 MG tablet Take 2 tablets by mouth every 6 (six) hours as needed for severe pain (pain score 7-10). 120 tablet 0   phenazopyridine  (PYRIDIUM ) 200 MG tablet Take 1 tablet (200 mg total) by mouth 3 (three) times daily as needed for pain. 10 tablet 0   sertraline  (ZOLOFT ) 50 MG tablet TAKE 1 TABLET(50 MG) BY MOUTH DAILY 90 tablet 1   tirzepatide  (MOUNJARO ) 10 MG/0.5ML Pen Inject 10 mg into the skin once  a week. 2 mL 3   No current facility-administered medications for this  visit.    Review of Systems  Constitutional:  Positive for fatigue. Negative for chills, fever and unexpected weight change.  HENT:   Negative for hearing loss and voice change.   Eyes:  Negative for eye problems.  Respiratory:  Negative for chest tightness and cough.   Cardiovascular:  Negative for chest pain.  Gastrointestinal:  Negative for abdominal distention, abdominal pain and blood in stool.  Endocrine: Negative for hot flashes.  Genitourinary:  Negative for difficulty urinating and frequency.   Musculoskeletal:  Negative for arthralgias.  Skin:  Negative for itching and rash.  Neurological:  Negative for extremity weakness.  Hematological:  Negative for adenopathy.  Psychiatric/Behavioral:  Negative for confusion.    PHYSICAL EXAMINATION:  Vitals:   02/02/24 1457  Pulse: 100  Resp: 18  Temp: 97.6 F (36.4 C)  SpO2: 97%   Filed Weights   02/02/24 1457  Weight: (!) 339 lb 12.8 oz (154.1 kg)    Physical Exam Constitutional:      General: She is not in acute distress.    Appearance: She is obese.  HENT:     Head: Normocephalic and atraumatic.  Eyes:     General: No scleral icterus. Cardiovascular:     Rate and Rhythm: Normal rate and regular rhythm.     Heart sounds: Murmur heard.  Pulmonary:     Effort: Pulmonary effort is normal. No respiratory distress.     Breath sounds: Normal breath sounds. No wheezing.  Abdominal:     General: Bowel sounds are normal. There is no distension.     Palpations: Abdomen is soft.  Musculoskeletal:        General: No deformity. Normal range of motion.     Cervical back: Normal range of motion and neck supple.  Skin:    General: Skin is warm and dry.     Coloration: Skin is pale.     Findings: No erythema or rash.  Neurological:     Mental Status: She is alert and oriented to person, place, and time. Mental status is at baseline.  Psychiatric:        Mood and Affect: Mood normal.     LABORATORY DATA:  I have  reviewed the data as listed    Latest Ref Rng & Units 02/02/2024    3:57 PM 01/23/2024    1:52 PM 12/22/2023    6:20 AM  CBC  WBC 4.0 - 10.5 K/uL 9.6  11.2  10.8   Hemoglobin 12.0 - 15.0 g/dL 7.3  7.8  8.6   Hematocrit 36.0 - 46.0 % 24.9  25.0  26.6   Platelets 150 - 400 K/uL 410  458  453       Latest Ref Rng & Units 01/23/2024    1:52 PM 12/22/2023    6:20 AM 12/21/2023    5:41 AM  CMP  Glucose 70 - 99 mg/dL 696  295  284   BUN 6 - 24 mg/dL 15  13  23    Creatinine 0.57 - 1.00 mg/dL 1.32  4.40  1.02   Sodium 134 - 144 mmol/L 139  139  137   Potassium 3.5 - 5.2 mmol/L 4.5  3.7  3.7   Chloride 96 - 106 mmol/L 98  105  104   CO2 20 - 29 mmol/L 24  25  23    Calcium 8.7 - 10.2 mg/dL 9.1  8.3  8.4  Total Protein 6.0 - 8.5 g/dL 7.9     Total Bilirubin 0.0 - 1.2 mg/dL 0.2     Alkaline Phos 44 - 121 IU/L 125     AST 0 - 40 IU/L 10     ALT 0 - 32 IU/L 5         RADIOGRAPHIC STUDIES: I have personally reviewed the radiological images as listed and agreed with the findings in the report. ECHOCARDIOGRAM COMPLETE Result Date: 12/20/2023    ECHOCARDIOGRAM REPORT   Patient Name:   HULDAH MARIN Date of Exam: 12/20/2023 Medical Rec #:  [id number]    Height:       66.0 in Accession #:    [id number]   Weight:       352.5 lb Date of Birth:  [birth year]    BSA:          2.544 m Patient Age:    42 years     BP:           107/55 mmHg Patient Gender: F            HR:           90 bpm. Exam Location:  [facility] Procedure: 2D Echo, Cardiac Doppler, Color Doppler and Strain Analysis (Both            Spectral and Color Flow Doppler were utilized during procedure). Indications:     Murmur  History:         Patient has no prior history of Echocardiogram examinations.                  Signs/Symptoms:Murmur; Risk Factors:Hypertension, Diabetes and                  Sleep Apnea.  Sonographer:     Clarke Crouch Referring Phys:  [id number] KHABIB DGAYLI Diagnosing Phys: Sammy Crisp MD  Sonographer Comments: Patient is obese.  Global longitudinal strain was attempted. IMPRESSIONS  1. Left ventricular ejection fraction, by estimation, is 55 to 60%. The left ventricle has normal function. The left ventricle has no regional wall motion abnormalities. There is mild left ventricular hypertrophy. Left ventricular diastolic parameters were normal. The average left ventricular global longitudinal strain is -16.4 %. The global longitudinal strain is normal.  2. Right ventricular systolic function is normal. The right ventricular size is normal. There is mildly elevated pulmonary artery systolic pressure.  3. The mitral valve is abnormal. Trivial mitral valve regurgitation.  4. The aortic valve is tricuspid. Aortic valve regurgitation is not visualized. No aortic stenosis is present.  5. The inferior vena cava is dilated in size with >50% respiratory variability, suggesting right atrial pressure of 8 mmHg. FINDINGS  Left Ventricle: Left ventricular ejection fraction, by estimation, is 55 to 60%. The left ventricle has normal function. The left ventricle has no regional wall motion abnormalities. The average left ventricular global longitudinal strain is -16.4 %. Strain was performed and the global longitudinal strain is normal. The left ventricular internal cavity size was normal in size. There is mild left ventricular hypertrophy. Left ventricular diastolic parameters were normal. Right Ventricle: The right ventricular size is normal. No increase in right ventricular wall thickness. Right ventricular systolic function is normal. There is mildly elevated pulmonary artery systolic pressure. The tricuspid regurgitant velocity is 2.81  m/s, and with an assumed right atrial pressure of 8 mmHg, the estimated right ventricular systolic pressure is 39.6 mmHg. Left Atrium: Left atrial size was normal in size. Right  Atrium: Right atrial size was normal in size. Pericardium: There is no evidence of pericardial effusion. Mitral Valve: The mitral valve is  abnormal. There is mild thickening of the mitral valve leaflet(s). Trivial mitral valve regurgitation. MV peak gradient, 8.3 mmHg. The mean mitral valve gradient is 4.0 mmHg. Tricuspid Valve: The tricuspid valve is normal in structure. Tricuspid valve regurgitation is mild. Aortic Valve: The aortic valve is tricuspid. Aortic valve regurgitation is not visualized. No aortic stenosis is present. Aortic valve mean gradient measures 8.0 mmHg. Aortic valve peak gradient measures 16.6 mmHg. Aortic valve area, by VTI measures 2.28  cm. Pulmonic Valve: The pulmonic valve was not well visualized. Pulmonic valve regurgitation is not visualized. No evidence of pulmonic stenosis. Aorta: The aortic root is normal in size and structure. Pulmonary Artery: The pulmonary artery is not well seen. Venous: The inferior vena cava is dilated in size with greater than 50% respiratory variability, suggesting right atrial pressure of 8 mmHg. IAS/Shunts: The interatrial septum was not well visualized.  LEFT VENTRICLE PLAX 2D LVIDd:         4.60 cm      Diastology LVIDs:         3.30 cm      LV e' medial:    13.50 cm/s LV PW:         1.24 cm      LV E/e' medial:  10.4 LV IVS:        1.00 cm      LV e' lateral:   16.20 cm/s LVOT diam:     2.00 cm      LV E/e' lateral: 8.7 LV SV:         90 LV SV Index:   35           2D Longitudinal Strain LVOT Area:     3.14 cm     2D Strain GLS Avg:     -16.4 %  LV Volumes (MOD) LV vol d, MOD A2C: 96.5 ml LV vol d, MOD A4C: 111.0 ml LV vol s, MOD A2C: 41.6 ml LV vol s, MOD A4C: 47.1 ml LV SV MOD A2C:     54.9 ml LV SV MOD A4C:     111.0 ml LV SV MOD BP:      63.2 ml RIGHT VENTRICLE RV Basal diam:  3.90 cm RV Mid diam:    3.40 cm RV S prime:     20.80 cm/s TAPSE (M-mode): 3.2 cm LEFT ATRIUM             Index        RIGHT ATRIUM           Index LA diam:        3.80 cm 1.49 cm/m   RA Area:     19.50 cm LA Vol (A2C):   60.1 ml 23.62 ml/m  RA Volume:   62.10 ml  24.41 ml/m LA Vol (A4C):   47.1 ml 18.51  ml/m LA Biplane Vol: 54.1 ml 21.26 ml/m  AORTIC VALVE                     PULMONIC VALVE AV Area (Vmax):    2.40 cm      PV Vmax:       1.38 m/s AV Area (Vmean):   2.17 cm      PV Peak grad:  7.6 mmHg AV Area (VTI):     2.28 cm AV Vmax:  204.00 cm/s AV Vmean:          131.000 cm/s AV VTI:            0.392 m AV Peak Grad:      16.6 mmHg AV Mean Grad:      8.0 mmHg LVOT Vmax:         156.00 cm/s LVOT Vmean:        90.300 cm/s LVOT VTI:          0.285 m LVOT/AV VTI ratio: 0.73  AORTA Ao Root diam: 2.90 cm MITRAL VALVE                TRICUSPID VALVE MV Area (PHT): 4.33 cm     TR Peak grad:   31.6 mmHg MV Area VTI:   2.43 cm     TR Vmax:        281.00 cm/s MV Peak grad:  8.3 mmHg MV Mean grad:  4.0 mmHg     SHUNTS MV Vmax:       1.44 m/s     Systemic VTI:  0.29 m MV Vmean:      94.0 cm/s    Systemic Diam: 2.00 cm MV Decel Time: 175 msec MV E velocity: 141.00 cm/s MV A velocity: 114.00 cm/s MV E/A ratio:  1.24 Christopher End MD Electronically signed by Sammy Crisp MD Signature Date/Time: 12/20/2023/9:53:49 AM    Final    MR ABDOMEN W WO CONTRAST Result Date: 12/19/2023 CLINICAL DATA:  Adnexal mass, pelvic cystic lesion, low hemoglobin, concern for ruptured cyst EXAM: MRI ABDOMEN AND PELVIS WITHOUT AND WITH CONTRAST TECHNIQUE: Multiplanar multisequence MR imaging of the abdomen and pelvis was performed both before and after the administration of intravenous contrast. CONTRAST:  10mL GADAVIST  GADOBUTROL  1 MMOL/ML IV SOLN COMPARISON:  12/09/2023 FINDINGS: COMBINED FINDINGS FOR BOTH MR ABDOMEN AND PELVIS Lower chest: No acute abnormality. Hepatobiliary: No focal liver abnormality is seen. Hepatomegaly, maximum coronal span 22.6 cm. Mild hepatic steatosis. Status post cholecystectomy. No biliary dilatation. Pancreas: Unremarkable. No pancreatic ductal dilatation or surrounding inflammatory changes. Spleen: Splenomegaly, maximum span 14.7 cm. Adrenals/Urinary Tract: Adrenal glands are unremarkable. Kidneys  are normal, without renal calculi, solid lesion, or hydronephrosis. Bladder is unremarkable. Stomach/Bowel: Stomach is within normal limits. Appendix appears normal. No evidence of bowel wall thickening, distention, or inflammatory changes. Vascular/Lymphatic: No significant vascular findings are present. No enlarged abdominal or pelvic lymph nodes. Reproductive: Large cystic lesion in the pelvis has slightly increased in size, on today's examination measuring 11.7 x 11.1 cm, previously 10.5 x 10.0 cm when measured similarly (series 4, image 17). An internal septation has resolved, and this remains with homogeneous intrinsically T1 and T2 intermediate signal and is without internal contrast enhancement. As on prior examination, this is essentially inseparable from the posterior uterine fundus, and multiple uterine fibroids almost completely efface the normal endometrial cavity (series 12, image 21). No normal left ovarian tissue is visible. Normal right ovary with a small functional cyst or follicle requiring no further follow-up or characterization. IUD in the fundal endometrial cavity. Other: No abdominal wall hernia or abnormality. No ascites. Musculoskeletal: No acute or significant osseous findings. IMPRESSION: 1. Large cystic lesion in the pelvis has slightly increased in size, on today's examination measuring 11.7 x 11.1 cm, previously 10.5 x 10.0 cm when measured similarly. An internal septation has resolved, and this remains with homogeneous intrinsically T1 and T2 intermediate signal and is without internal contrast enhancement. Given behavior over the short interval and  patient demographic, a large hemorrhagic ovarian cyst or alternately an endometrioma strongly favored. 2. No acute findings in the abdomen. Specifically no evidence of free fluid in the abdomen or pelvis to suggest cyst rupture. Electronically Signed   By: Fredricka Jenny M.D.   On: 12/19/2023 07:03   MR PELVIS W WO CONTRAST Result Date:  12/19/2023 CLINICAL DATA:  Adnexal mass, pelvic cystic lesion, low hemoglobin, concern for ruptured cyst EXAM: MRI ABDOMEN AND PELVIS WITHOUT AND WITH CONTRAST TECHNIQUE: Multiplanar multisequence MR imaging of the abdomen and pelvis was performed both before and after the administration of intravenous contrast. CONTRAST:  10mL GADAVIST  GADOBUTROL  1 MMOL/ML IV SOLN COMPARISON:  12/09/2023 FINDINGS: COMBINED FINDINGS FOR BOTH MR ABDOMEN AND PELVIS Lower chest: No acute abnormality. Hepatobiliary: No focal liver abnormality is seen. Hepatomegaly, maximum coronal span 22.6 cm. Mild hepatic steatosis. Status post cholecystectomy. No biliary dilatation. Pancreas: Unremarkable. No pancreatic ductal dilatation or surrounding inflammatory changes. Spleen: Splenomegaly, maximum span 14.7 cm. Adrenals/Urinary Tract: Adrenal glands are unremarkable. Kidneys are normal, without renal calculi, solid lesion, or hydronephrosis. Bladder is unremarkable. Stomach/Bowel: Stomach is within normal limits. Appendix appears normal. No evidence of bowel wall thickening, distention, or inflammatory changes. Vascular/Lymphatic: No significant vascular findings are present. No enlarged abdominal or pelvic lymph nodes. Reproductive: Large cystic lesion in the pelvis has slightly increased in size, on today's examination measuring 11.7 x 11.1 cm, previously 10.5 x 10.0 cm when measured similarly (series 4, image 17). An internal septation has resolved, and this remains with homogeneous intrinsically T1 and T2 intermediate signal and is without internal contrast enhancement. As on prior examination, this is essentially inseparable from the posterior uterine fundus, and multiple uterine fibroids almost completely efface the normal endometrial cavity (series 12, image 21). No normal left ovarian tissue is visible. Normal right ovary with a small functional cyst or follicle requiring no further follow-up or characterization. IUD in the fundal  endometrial cavity. Other: No abdominal wall hernia or abnormality. No ascites. Musculoskeletal: No acute or significant osseous findings. IMPRESSION: 1. Large cystic lesion in the pelvis has slightly increased in size, on today's examination measuring 11.7 x 11.1 cm, previously 10.5 x 10.0 cm when measured similarly. An internal septation has resolved, and this remains with homogeneous intrinsically T1 and T2 intermediate signal and is without internal contrast enhancement. Given behavior over the short interval and patient demographic, a large hemorrhagic ovarian cyst or alternately an endometrioma strongly favored. 2. No acute findings in the abdomen. Specifically no evidence of free fluid in the abdomen or pelvis to suggest cyst rupture. Electronically Signed   By: Fredricka Jenny M.D.   On: 12/19/2023 07:03   DG Chest 2 View Result Date: 12/18/2023 CLINICAL DATA:  Fever and chills, weakness EXAM: CHEST - 2 VIEW COMPARISON:  None Available. FINDINGS: The heart size and mediastinal contours are within normal limits. Both lungs are clear. The visualized skeletal structures are unremarkable. IMPRESSION: No active cardiopulmonary disease. Electronically Signed   By: Bobbye Burrow M.D.   On: 12/18/2023 18:26   MR PELVIS W WO CONTRAST Result Date: 12/09/2023 CLINICAL DATA:  Adnexal mass, malignancy suspected, cystic lesion of the pelvis identified by prior ultrasound EXAM: MRI ABDOMEN AND PELVIS WITHOUT AND WITH CONTRAST TECHNIQUE: Multiplanar multisequence MR imaging of the abdomen and pelvis was performed both before and after the administration of intravenous contrast. CONTRAST:  10mL GADAVIST  GADOBUTROL  1 MMOL/ML IV SOLN COMPARISON:  None Available. FINDINGS: COMBINED FINDINGS FOR BOTH MR ABDOMEN AND PELVIS Lower chest:  No acute abnormality. Hepatobiliary: No solid liver abnormality is seen. Hepatomegaly, maximum coronal span 21.9 cm. Status post cholecystectomy. Postoperative biliary ductal dilatation  Pancreas: Unremarkable. No pancreatic ductal dilatation or surrounding inflammatory changes. Spleen: Splenomegaly, maximum span 14.6 cm. Adrenals/Urinary Tract: Adrenal glands are unremarkable. Kidneys are normal, without renal calculi, solid lesion, or hydronephrosis. Bladder is unremarkable. Stomach/Bowel: Stomach is within normal limits. Appendix appears normal. No evidence of bowel wall thickening, distention, or inflammatory changes. Vascular/Lymphatic: No significant vascular findings are present. No enlarged abdominal or pelvic lymph nodes. Reproductive: IUD in the endometrial cavity. Contrast enhancing fibroids which distort and almost completely efface endometrial cavity. At the posterior aspect of the uterine fundus and distorting the contour of the uterus, there is a large, septated lesion measuring 10.6 x 10.2 x 9.2 cm (series 24, image 15, series 26, image 18). This demonstrates heterogeneous internal T1 and T2 intermediate signal and by manual measurements (subtraction images not provided for review), this does not appear to enhance on multiphasic sequences. Normal left ovarian tissue is not clearly visualized. Normal right ovary with a small, benign, functional cysts requiring no specific further follow-up or characterization. Other: No abdominal wall hernia or abnormality. No ascites. Musculoskeletal: No acute or significant osseous findings. IMPRESSION: 1. At the posterior aspect of the uterine fundus and distorting the contour of the uterus, there is a large, septated lesion measuring 10.6 x 10.2 x 9.2 cm. This demonstrates heterogeneous internal T1 and T2 intermediate and does not appear to enhance on multiphasic sequences. Given intimate relationship to the uterus this may reflect a large degenerated fibroid, or alternately a large hemorrhagic left ovarian cyst or endometrioma. Cystic ovarian malignancy not generally excluded but not favored. 2. Normal left ovarian tissue is not clearly  visualized. 3. Normal right ovary with a small, benign, functional cyst requiring no specific further follow-up or characterization. 4. IUD in the endometrial cavity. Non degenerated uterine fibroids which distort and almost completely efface endometrial cavity. Electronically Signed   By: Fredricka Jenny M.D.   On: 12/09/2023 15:50   MR Abdomen W or Wo Contrast Result Date: 12/09/2023 CLINICAL DATA:  Adnexal mass, malignancy suspected, cystic lesion of the pelvis identified by prior ultrasound EXAM: MRI ABDOMEN AND PELVIS WITHOUT AND WITH CONTRAST TECHNIQUE: Multiplanar multisequence MR imaging of the abdomen and pelvis was performed both before and after the administration of intravenous contrast. CONTRAST:  10mL GADAVIST  GADOBUTROL  1 MMOL/ML IV SOLN COMPARISON:  None Available. FINDINGS: COMBINED FINDINGS FOR BOTH MR ABDOMEN AND PELVIS Lower chest: No acute abnormality. Hepatobiliary: No solid liver abnormality is seen. Hepatomegaly, maximum coronal span 21.9 cm. Status post cholecystectomy. Postoperative biliary ductal dilatation Pancreas: Unremarkable. No pancreatic ductal dilatation or surrounding inflammatory changes. Spleen: Splenomegaly, maximum span 14.6 cm. Adrenals/Urinary Tract: Adrenal glands are unremarkable. Kidneys are normal, without renal calculi, solid lesion, or hydronephrosis. Bladder is unremarkable. Stomach/Bowel: Stomach is within normal limits. Appendix appears normal. No evidence of bowel wall thickening, distention, or inflammatory changes. Vascular/Lymphatic: No significant vascular findings are present. No enlarged abdominal or pelvic lymph nodes. Reproductive: IUD in the endometrial cavity. Contrast enhancing fibroids which distort and almost completely efface endometrial cavity. At the posterior aspect of the uterine fundus and distorting the contour of the uterus, there is a large, septated lesion measuring 10.6 x 10.2 x 9.2 cm (series 24, image 15, series 26, image 18). This  demonstrates heterogeneous internal T1 and T2 intermediate signal and by manual measurements (subtraction images not provided for review), this does not appear  to enhance on multiphasic sequences. Normal left ovarian tissue is not clearly visualized. Normal right ovary with a small, benign, functional cysts requiring no specific further follow-up or characterization. Other: No abdominal wall hernia or abnormality. No ascites. Musculoskeletal: No acute or significant osseous findings. IMPRESSION: 1. At the posterior aspect of the uterine fundus and distorting the contour of the uterus, there is a large, septated lesion measuring 10.6 x 10.2 x 9.2 cm. This demonstrates heterogeneous internal T1 and T2 intermediate and does not appear to enhance on multiphasic sequences. Given intimate relationship to the uterus this may reflect a large degenerated fibroid, or alternately a large hemorrhagic left ovarian cyst or endometrioma. Cystic ovarian malignancy not generally excluded but not favored. 2. Normal left ovarian tissue is not clearly visualized. 3. Normal right ovary with a small, benign, functional cyst requiring no specific further follow-up or characterization. 4. IUD in the endometrial cavity. Non degenerated uterine fibroids which distort and almost completely efface endometrial cavity. Electronically Signed   By: Fredricka Jenny M.D.   On: 12/09/2023 15:50   US  PELVIC COMPLETE W TRANSVAGINAL AND TORSION R/O Result Date: 12/09/2023 CLINICAL DATA:  Lower abdominal pain, vaginal bleeding for 10 days. EXAM: TRANSABDOMINAL AND TRANSVAGINAL ULTRASOUND OF PELVIS DOPPLER ULTRASOUND OF OVARIES TECHNIQUE: Both transabdominal and transvaginal ultrasound examinations of the pelvis were performed. Transabdominal technique was performed for global imaging of the pelvis including uterus, ovaries, adnexal regions, and pelvic cul-de-sac. It was necessary to proceed with endovaginal exam following the transabdominal exam to  visualize the endometrium and ovaries. Color and duplex Doppler ultrasound was utilized to evaluate blood flow to the ovaries. COMPARISON:  March 24, 2009. FINDINGS: Uterus Measurements: 6.2 x 3.8 x 3.1 cm = volume: 38 mL. Probable fibroid measuring 4.9 x 4.7 x 4.0 cm is noted posteriorly. Endometrium Thickness: 5 mm which is within normal limits. No focal abnormality visualized. Right ovary Not clearly visualized. Left ovary Not clearly visualized. Pulsed Doppler evaluation of cystic mass posteriorly and pelvis demonstrates normal low-resistance arterial and venous waveforms. Other findings 9.3 x 9.1 x 8.7 cm mildly complex cystic lesion is noted posteriorly in the midline of the pelvis. This may be ovarian in etiology, but it cannot be determined from which ovary it arises, if any. Potentially may represent endometrioma. IMPRESSION: Ovaries are not clearly visualized and therefore ovarian torsion cannot be excluded on the basis of this exam. 9.3 x 9.1 x 8.7 cm mildly complex cystic lesion is noted posteriorly in the midline of the pelvis. This may be ovarian etiology, but it cannot be determined from which ovary it arises, if any. Potentially this may represent endometrioma. MRI is recommended for further evaluation. Probable 4.9 cm fibroid noted posteriorly and uterus. Electronically Signed   By: Rosalene Colon M.D.   On: 12/09/2023 13:07

## 2024-02-02 NOTE — Addendum Note (Signed)
 Addended by: Timmy Forbes on: 02/02/2024 10:31 PM   Modules accepted: Orders

## 2024-02-02 NOTE — Assessment & Plan Note (Addendum)
 Encourage oral hydration and avoid nephrotoxins.  Check protein electrophoresis

## 2024-02-02 NOTE — Assessment & Plan Note (Addendum)
 Patient has new onset of microcytic anemia since September 2024. Decreased iron  saturation, decreased reticulocyte hemoglobin level, indicating iron  deficiency. Ferritin level is not typical.  Possibly secondary to underlying inflammation or recent IV iron  treatments.  Recommend additional IV Feraheme weekly x 2.  She has previously tolerated well.  Rationale potential side effects were reviewed with patient.  There may be an component of anemia in CKD. Possible need for erythropoietin replacement therapy if hemoglobin remains low despite improved iron  stores.  Etiology of iron  deficiency, unknown.  Recommend patient have gastroenterology evaluation. Hemorrhagic ovarian cyst may also attribute to the iron  deficiency.  Addendum: Kenny Peals is non preferred medication for her insurance. I recommend IV venofer  weekly x 4. During her visit on 02/02/2024, I have discussed the possibility of change to venofer  due to insurance and discussed the rationale and possible side effects of Venofer . She has agreed with the plan.

## 2024-02-03 ENCOUNTER — Telehealth: Payer: Self-pay

## 2024-02-03 NOTE — Telephone Encounter (Signed)
-----   Message from Timmy Forbes sent at 02/02/2024 10:27 PM EDT ----- Please arrange patient to get IV Feraheme weekly x 2. Arrange for IV 4 weeks, lab MD +/- Retacrit.

## 2024-02-03 NOTE — Telephone Encounter (Signed)
 Jessica Fields will you please arrange patient to get IV Feraheme weekly x 2 and arrange for IV Feraheme in 4 weeks, lab MD +/- Retacrit and notify patient of dates and times.

## 2024-02-04 LAB — MISC LABCORP TEST (SEND OUT): Labcorp test code: 143305

## 2024-02-06 ENCOUNTER — Telehealth: Payer: Self-pay

## 2024-02-06 ENCOUNTER — Other Ambulatory Visit: Payer: Self-pay | Admitting: Oncology

## 2024-02-06 LAB — PROTEIN ELECTROPHORESIS, SERUM
A/G Ratio: 0.5 — ABNORMAL LOW (ref 0.7–1.7)
Albumin ELP: 2.8 g/dL — ABNORMAL LOW (ref 2.9–4.4)
Alpha-1-Globulin: 0.5 g/dL — ABNORMAL HIGH (ref 0.0–0.4)
Alpha-2-Globulin: 1.2 g/dL — ABNORMAL HIGH (ref 0.4–1.0)
Beta Globulin: 1.4 g/dL — ABNORMAL HIGH (ref 0.7–1.3)
Gamma Globulin: 2.1 g/dL — ABNORMAL HIGH (ref 0.4–1.8)
Globulin, Total: 5.1 g/dL — ABNORMAL HIGH (ref 2.2–3.9)
Total Protein ELP: 7.9 g/dL (ref 6.0–8.5)

## 2024-02-06 NOTE — Telephone Encounter (Signed)
 Due to insurance auth, iron  infusion has been changed to Venofer . Detailed VM and mychart message left for pt letting her know of change and to stop by scheduling to schedule additional iron  infusions

## 2024-02-07 ENCOUNTER — Inpatient Hospital Stay

## 2024-02-07 VITALS — BP 113/49 | HR 84 | Temp 97.3°F | Resp 24

## 2024-02-07 DIAGNOSIS — D509 Iron deficiency anemia, unspecified: Secondary | ICD-10-CM | POA: Diagnosis not present

## 2024-02-07 DIAGNOSIS — D508 Other iron deficiency anemias: Secondary | ICD-10-CM

## 2024-02-07 MED ORDER — IRON SUCROSE 20 MG/ML IV SOLN
200.0000 mg | Freq: Once | INTRAVENOUS | Status: AC
  Administered 2024-02-07: 200 mg via INTRAVENOUS
  Filled 2024-02-07: qty 10

## 2024-02-07 MED ORDER — SODIUM CHLORIDE 0.9% FLUSH
10.0000 mL | Freq: Once | INTRAVENOUS | Status: AC | PRN
  Administered 2024-02-07: 10 mL
  Filled 2024-02-07: qty 10

## 2024-02-09 ENCOUNTER — Other Ambulatory Visit: Payer: Self-pay | Admitting: Family Medicine

## 2024-02-09 DIAGNOSIS — R1011 Right upper quadrant pain: Secondary | ICD-10-CM

## 2024-02-10 ENCOUNTER — Other Ambulatory Visit: Payer: Self-pay | Admitting: Family Medicine

## 2024-02-10 DIAGNOSIS — M7661 Achilles tendinitis, right leg: Secondary | ICD-10-CM

## 2024-02-10 DIAGNOSIS — I10 Essential (primary) hypertension: Secondary | ICD-10-CM

## 2024-02-10 MED ORDER — OXYCODONE-ACETAMINOPHEN 7.5-325 MG PO TABS: 2.0000 | ORAL_TABLET | Freq: Four times a day (QID) | ORAL | 0 refills | Status: DC | PRN

## 2024-02-14 ENCOUNTER — Inpatient Hospital Stay

## 2024-02-14 VITALS — BP 105/64 | HR 80 | Temp 97.2°F | Resp 18

## 2024-02-14 DIAGNOSIS — D508 Other iron deficiency anemias: Secondary | ICD-10-CM

## 2024-02-14 DIAGNOSIS — D509 Iron deficiency anemia, unspecified: Secondary | ICD-10-CM | POA: Diagnosis not present

## 2024-02-14 MED ORDER — IRON SUCROSE 20 MG/ML IV SOLN
200.0000 mg | Freq: Once | INTRAVENOUS | Status: AC
  Administered 2024-02-14: 200 mg via INTRAVENOUS
  Filled 2024-02-14: qty 10

## 2024-02-14 MED ORDER — SODIUM CHLORIDE 0.9% FLUSH
10.0000 mL | Freq: Once | INTRAVENOUS | Status: DC | PRN
Start: 2024-02-14 — End: 2024-02-14
  Filled 2024-02-14: qty 10

## 2024-02-14 NOTE — Patient Instructions (Signed)

## 2024-02-21 ENCOUNTER — Inpatient Hospital Stay: Attending: Oncology

## 2024-02-21 VITALS — BP 124/64 | HR 88 | Temp 98.2°F | Resp 18

## 2024-02-21 DIAGNOSIS — D508 Other iron deficiency anemias: Secondary | ICD-10-CM

## 2024-02-21 DIAGNOSIS — D509 Iron deficiency anemia, unspecified: Secondary | ICD-10-CM | POA: Insufficient documentation

## 2024-02-21 DIAGNOSIS — Z79899 Other long term (current) drug therapy: Secondary | ICD-10-CM | POA: Diagnosis not present

## 2024-02-21 MED ORDER — IRON SUCROSE 20 MG/ML IV SOLN
200.0000 mg | Freq: Once | INTRAVENOUS | Status: AC
  Administered 2024-02-21: 200 mg via INTRAVENOUS

## 2024-02-21 MED ORDER — SODIUM CHLORIDE 0.9% FLUSH
10.0000 mL | Freq: Once | INTRAVENOUS | Status: AC | PRN
  Administered 2024-02-21: 10 mL
  Filled 2024-02-21: qty 10

## 2024-02-23 ENCOUNTER — Other Ambulatory Visit: Payer: Self-pay | Admitting: Family Medicine

## 2024-02-23 DIAGNOSIS — R1011 Right upper quadrant pain: Secondary | ICD-10-CM

## 2024-02-26 MED ORDER — OXYCODONE-ACETAMINOPHEN 7.5-325 MG PO TABS: 2.0000 | ORAL_TABLET | Freq: Four times a day (QID) | ORAL | 0 refills | Status: DC | PRN

## 2024-02-28 ENCOUNTER — Inpatient Hospital Stay

## 2024-02-28 VITALS — BP 116/65 | HR 78 | Temp 97.6°F

## 2024-02-28 DIAGNOSIS — D508 Other iron deficiency anemias: Secondary | ICD-10-CM

## 2024-02-28 DIAGNOSIS — D509 Iron deficiency anemia, unspecified: Secondary | ICD-10-CM | POA: Diagnosis not present

## 2024-02-28 MED ORDER — IRON SUCROSE 20 MG/ML IV SOLN
200.0000 mg | Freq: Once | INTRAVENOUS | Status: AC
Start: 2024-02-28 — End: 2024-02-28
  Administered 2024-02-28: 200 mg via INTRAVENOUS

## 2024-02-28 MED ORDER — SODIUM CHLORIDE 0.9% FLUSH
10.0000 mL | Freq: Once | INTRAVENOUS | Status: AC | PRN
  Administered 2024-02-28: 10 mL
  Filled 2024-02-28: qty 10

## 2024-03-06 ENCOUNTER — Encounter: Payer: Self-pay | Admitting: Oncology

## 2024-03-06 ENCOUNTER — Inpatient Hospital Stay (HOSPITAL_BASED_OUTPATIENT_CLINIC_OR_DEPARTMENT_OTHER): Admitting: Oncology

## 2024-03-06 ENCOUNTER — Inpatient Hospital Stay

## 2024-03-06 VITALS — BP 113/71

## 2024-03-06 VITALS — BP 126/57 | HR 83 | Temp 97.6°F | Resp 16 | Wt 332.0 lb

## 2024-03-06 DIAGNOSIS — N1831 Chronic kidney disease, stage 3a: Secondary | ICD-10-CM

## 2024-03-06 DIAGNOSIS — D508 Other iron deficiency anemias: Secondary | ICD-10-CM

## 2024-03-06 DIAGNOSIS — D509 Iron deficiency anemia, unspecified: Secondary | ICD-10-CM | POA: Diagnosis not present

## 2024-03-06 DIAGNOSIS — D75839 Thrombocytosis, unspecified: Secondary | ICD-10-CM | POA: Diagnosis not present

## 2024-03-06 DIAGNOSIS — R1900 Intra-abdominal and pelvic swelling, mass and lump, unspecified site: Secondary | ICD-10-CM | POA: Diagnosis not present

## 2024-03-06 LAB — CBC WITH DIFFERENTIAL/PLATELET
Abs Immature Granulocytes: 0.04 10*3/uL (ref 0.00–0.07)
Basophils Absolute: 0.1 10*3/uL (ref 0.0–0.1)
Basophils Relative: 1 %
Eosinophils Absolute: 0.3 10*3/uL (ref 0.0–0.5)
Eosinophils Relative: 4 %
HCT: 28.8 % — ABNORMAL LOW (ref 36.0–46.0)
Hemoglobin: 8.8 g/dL — ABNORMAL LOW (ref 12.0–15.0)
Immature Granulocytes: 1 %
Lymphocytes Relative: 20 %
Lymphs Abs: 1.7 10*3/uL (ref 0.7–4.0)
MCH: 24.7 pg — ABNORMAL LOW (ref 26.0–34.0)
MCHC: 30.6 g/dL (ref 30.0–36.0)
MCV: 80.9 fL (ref 80.0–100.0)
Monocytes Absolute: 0.6 10*3/uL (ref 0.1–1.0)
Monocytes Relative: 7 %
Neutro Abs: 5.5 10*3/uL (ref 1.7–7.7)
Neutrophils Relative %: 67 %
Platelets: 361 10*3/uL (ref 150–400)
RBC: 3.56 MIL/uL — ABNORMAL LOW (ref 3.87–5.11)
RDW: 17.4 % — ABNORMAL HIGH (ref 11.5–15.5)
WBC: 8.2 10*3/uL (ref 4.0–10.5)
nRBC: 0 % (ref 0.0–0.2)

## 2024-03-06 LAB — RETIC PANEL
Immature Retic Fract: 7.9 % (ref 2.3–15.9)
RBC.: 3.6 MIL/uL — ABNORMAL LOW (ref 3.87–5.11)
Retic Count, Absolute: 63.7 10*3/uL (ref 19.0–186.0)
Retic Ct Pct: 1.8 % (ref 0.4–3.1)
Reticulocyte Hemoglobin: 26.4 pg — ABNORMAL LOW (ref >27.9)

## 2024-03-06 MED ORDER — IRON SUCROSE 20 MG/ML IV SOLN
200.0000 mg | Freq: Once | INTRAVENOUS | Status: AC
  Administered 2024-03-06: 200 mg via INTRAVENOUS

## 2024-03-06 NOTE — Assessment & Plan Note (Signed)
 Encourage oral hydration and avoid nephrotoxins.  No M protein of protein electrophoresis

## 2024-03-06 NOTE — Assessment & Plan Note (Signed)
 New onset since February 2025.  Likely reactive to inflammation or iron  deficiency. Resolved.

## 2024-03-06 NOTE — Progress Notes (Signed)
 Patient is doing ok, she is having some fatigue still, she didn't feel like the iron  infusions helped that much. She said she could stay up a little bit later but still not having a lot of energy.

## 2024-03-06 NOTE — Assessment & Plan Note (Addendum)
 Labs are reviewed and discussed with patient. Lab Results  Component Value Date   HGB 8.8 (L) 03/06/2024   TIBC 249 (L) 02/02/2024   IRONPCTSAT 9 (L) 02/02/2024   FERRITIN 238 02/02/2024    Hemoglobin has slightly improved.  Reticulocyte hemoglobin has improved, still decreased. Recommend additional IV Venofer  weekly x 3 to further increase iron  stores. Repeat blood work in 5 to 6 weeks.  Consider Retacrit if she has adequate iron  stores.

## 2024-03-06 NOTE — Assessment & Plan Note (Signed)
 large hemorrhagic ovarian cyst or endometrioma  Recommend patient to continue follow-up with gynecology oncology Dr. Randalyn Bushman

## 2024-03-06 NOTE — Progress Notes (Signed)
 Hematology/Oncology Progress note [phone] [phone]           REFERRING PROVIDER: Lamon Pillow, MD   CHIEF COMPLAINTS/REASON FOR VISIT:  iron  deficiency anemia    ASSESSMENT & PLAN:   Iron  deficiency anemia Labs are reviewed and discussed with patient. Lab Results  Component Value Date   HGB 8.8 (L) 03/06/2024   TIBC 249 (L) 02/02/2024   IRONPCTSAT 9 (L) 02/02/2024   FERRITIN 238 02/02/2024    Hemoglobin has slightly improved.  Reticulocyte hemoglobin has improved, still decreased. Recommend additional IV Venofer  weekly x 3 to further increase iron  stores. Repeat blood work in 5 to 6 weeks.  Consider Retacrit if she has adequate iron  stores.  CKD (chronic kidney disease) stage 3, GFR 30-59 ml/min (HCC) Encourage oral hydration and avoid nephrotoxins.  No M protein of protein electrophoresis   Pelvic mass large hemorrhagic ovarian cyst or endometrioma  Recommend patient to continue follow-up with gynecology oncology Dr. Randalyn Bushman  Thrombocytosis New onset since February 2025.  Likely reactive to inflammation or iron  deficiency. Resolved.    Orders Placed This Encounter  Procedures   CBC with Differential (Cancer Center Only)    Standing Status:   Future    Expected Date:   04/10/2024    Expiration Date:   03/06/2025   Iron  and TIBC    Standing Status:   Future    Expected Date:   04/10/2024    Expiration Date:   03/06/2025   Ferritin    Standing Status:   Future    Expected Date:   04/10/2024    Expiration Date:   03/06/2025   Retic Panel    Standing Status:   Future    Expected Date:   04/10/2024    Expiration Date:   03/06/2025   Miscellaneous [facility] test (send-out)    Standing Status:   Future    Expected Date:   04/10/2024    Expiration Date:   03/06/2025    Test name / description::   Soluble transferrin receptor [facility] [id number]   Follow-up in 5 to 6 weeks weeks. All questions were answered. The patient knows to call the  clinic with any problems, questions or concerns.  Timmy Forbes, MD, PhD [facility] Hematology Oncology 03/06/2024   HISTORY OF PRESENTING ILLNESS:   Jessica Fields is a  43 y.o.  female with PMH listed below was seen in consultation at the request of  Lamon Pillow, MD  for evaluation of iron  deficiency anemia  12/09/2023, patient presented to ER for evaluation of abdominal pain. Ultrasound pelvis showed 9.3 x 9.1 x 8.7 cm mildly complex cystic lesion. MRI pelvis with and without contrast showed 1. At the posterior aspect of the uterine fundus and distorting the contour of the uterus, there is a large, septated lesion measuring 10.6 x 10.2 x 9.2 cm. 2. Normal left ovarian tissue is not clearly visualized. 3. Normal right ovary with a small, benign, functional cyst requiring no specific further follow-up or characterization. 4. IUD in the endometrial cavity. Non degenerated uterine fibroids which distort and almost completely efface endometrial cavity.  12/09/2023, patient was found to have a hemoglobin of 8.2, his level has decreased from her baseline of 10.9 in September 2024.  MCV was 76.2.  She also had a white count of 17.9.  Platelet count of 5 3 7. Iron  panel showed decreased iron  saturation to 7, TIBC 227.  No ferritin was checked.  Patient was seen by gynecologist  for pelvic mass evaluation. Patient reports that her gynecologist arrange her to get 3 IV Feraheme treatments on 12/16/2023, 12/26/2023 and 01/04/2024.  12/18/2023 - 12/22/2023, patient was admitted due to sepsis secondary to urinary tract infection.  She initially required vasopressors.  Patient was treated with IV antibiotics and discharged on 12/22/2023.  Her hemoglobin was low during this hospitalization.  She reports having 3 units of PRBC transfusions  She was seen by Dr. Randalyn Bushman during her admission and followed up outpatient at [facility].  Dr. Randalyn Bushman recommended observation and repeat imaging in July 2025.   She has a history of an  IUD and reports only light menstrual periods occasionally. No stomach pain, changes in bowel habits, or blood in the stool. Her medications include Celebrex  for arthritis, omeprazole  for acid reflux, metformin  for diabetes, and Zoloft  for mild depression and anxiety.   INTERVAL HISTORY Jessica Fields is a 43 y.o. female who has above history reviewed by me today presents for follow up visit for anemia. Patient has tolerated IV Venofer  treatments.  She did not notice any side effects.  She did not feel any drastic improvement of her chronic fatigue.  MEDICAL HISTORY:  Past Medical History:  Diagnosis Date   History of chicken pox    History of cholecystitis     SURGICAL HISTORY: Past Surgical History:  Procedure Laterality Date   Sleep Study  01/25/2005   Performed at [facility] by Dr. Clive Dancer. Interpretation: very severe Sleep Apnea with nocturnal desaturations. She was improved with nasal Bi-PAP, although was not titrated to optimal pressure. I suspect she has nasal sinus obstruction or congestion    SOCIAL HISTORY: Social History   Socioeconomic History   Marital status: Married    Spouse name: Not on file   Number of children: 0   Years of education: Not on file   Highest education level: Some college, no degree  Occupational History   Occupation: Transport planner  Tobacco Use   Smoking status: Never   Smokeless tobacco: Never  Substance and Sexual Activity   Alcohol use: Yes    Alcohol/week: 0.0 standard drinks of alcohol    Comment: occasional use   Drug use: No   Sexual activity: Not on file  Other Topics Concern   Not on file  Social History Narrative   Not on file   Social Drivers of Health   Financial Resource Strain: Low Risk  (01/23/2024)   Overall Financial Resource Strain (CARDIA)    Difficulty of Paying Living Expenses: Not very hard  Food Insecurity: No Food Insecurity (01/23/2024)   Hunger Vital Sign    Worried About Running Out of Food in the Last  Year: Never true    Ran Out of Food in the Last Year: Never true  Transportation Needs: No Transportation Needs (01/23/2024)   PRAPARE - Administrator, Civil Service (Medical): No    Lack of Transportation (Non-Medical): No  Physical Activity: Insufficiently Active (01/23/2024)   Exercise Vital Sign    Days of Exercise per Week: 2 days    Minutes of Exercise per Session: 20 min  Stress: No Stress Concern Present (01/23/2024)   [employer] of Occupational Health - Occupational Stress Questionnaire    Feeling of Stress : Only a little  Social Connections: Moderately Isolated (01/23/2024)   Social Connection and Isolation Panel [NHANES]    Frequency of Communication with Friends and Family: More than three times a week    Frequency of Social Gatherings  with Friends and Family: Once a week    Attends Religious Services: Never    Database administrator or Organizations: No    Attends Engineer, structural: Not on file    Marital Status: Married  Catering manager Violence: Not At Risk (12/18/2023)   Humiliation, Afraid, Rape, and Kick questionnaire    Fear of Current or Ex-Partner: No    Emotionally Abused: No    Physically Abused: No    Sexually Abused: No    FAMILY HISTORY: Family History  Problem Relation Age of Onset   Anemia Mother    Anemia Other    Breast cancer Neg Hx    Colon cancer Neg Hx     ALLERGIES:  is allergic to penicillins.  MEDICATIONS:  Current Outpatient Medications  Medication Sig Dispense Refill   amLODipine  (NORVASC ) 10 MG tablet TAKE 1 TABLET(10 MG) BY MOUTH DAILY 90 tablet 1   celecoxib  (CELEBREX ) 200 MG capsule TAKE 1 CAPSULE BY MOUTH TWICE DAILY 180 capsule 1   diclofenac  sodium (VOLTAREN ) 1 % GEL Apply 4 g topically 4 (four) times daily as needed. 100 g 5   fluticasone (FLONASE) 50 MCG/ACT nasal spray Place 2 sprays into both nostrils daily as needed.     lisinopril -hydrochlorothiazide  (ZESTORETIC ) 10-12.5 MG tablet TAKE 1 TABLET  BY MOUTH DAILY 90 tablet 0   Melatonin 10 MG TABS Take 10 mg by mouth at bedtime as needed.     metFORMIN  (GLUCOPHAGE ) 500 MG tablet TAKE 1 TABLET(500 MG) BY MOUTH THREE TIMES DAILY WITH MEALS 270 tablet 4   omeprazole  (PRILOSEC) 20 MG capsule TAKE 1 CAPSULE(20 MG) BY MOUTH DAILY 90 capsule 0   oxyCODONE -acetaminophen  (PERCOCET) 7.5-325 MG tablet Take 2 tablets by mouth every 6 (six) hours as needed for severe pain (pain score 7-10). 120 tablet 0   phenazopyridine  (PYRIDIUM ) 200 MG tablet Take 1 tablet (200 mg total) by mouth 3 (three) times daily as needed for pain. 10 tablet 0   sertraline  (ZOLOFT ) 50 MG tablet TAKE 1 TABLET(50 MG) BY MOUTH DAILY 90 tablet 1   tirzepatide  (MOUNJARO ) 10 MG/0.5ML Pen Inject 10 mg into the skin once a week. 2 mL 3   No current facility-administered medications for this visit.    Review of Systems  Constitutional:  Positive for fatigue. Negative for chills, fever and unexpected weight change.  HENT:   Negative for hearing loss and voice change.   Eyes:  Negative for eye problems.  Respiratory:  Negative for chest tightness and cough.   Cardiovascular:  Negative for chest pain.  Gastrointestinal:  Negative for abdominal distention, abdominal pain and blood in stool.  Endocrine: Negative for hot flashes.  Genitourinary:  Negative for difficulty urinating and frequency.   Musculoskeletal:  Negative for arthralgias.  Skin:  Negative for itching and rash.  Neurological:  Negative for extremity weakness.  Hematological:  Negative for adenopathy.  Psychiatric/Behavioral:  Negative for confusion.    PHYSICAL EXAMINATION:  Vitals:   03/06/24 1334  BP: (!) 126/57  Pulse: 83  Resp: 16  Temp: 97.6 F (36.4 C)  SpO2: 99%   Filed Weights   03/06/24 1334  Weight: (!) 332 lb (150.6 kg)    Physical Exam Constitutional:      General: She is not in acute distress.    Appearance: She is obese.  HENT:     Head: Normocephalic and atraumatic.  Eyes:      General: No scleral icterus. Cardiovascular:     Rate  and Rhythm: Normal rate and regular rhythm.  Pulmonary:     Effort: Pulmonary effort is normal. No respiratory distress.     Breath sounds: Normal breath sounds. No wheezing.  Abdominal:     General: Bowel sounds are normal. There is no distension.     Palpations: Abdomen is soft.  Musculoskeletal:        General: No deformity. Normal range of motion.     Cervical back: Normal range of motion and neck supple.  Skin:    General: Skin is warm and dry.     Findings: No erythema or rash.  Neurological:     Mental Status: She is alert and oriented to person, place, and time. Mental status is at baseline.  Psychiatric:        Mood and Affect: Mood normal.     LABORATORY DATA:  I have reviewed the data as listed    Latest Ref Rng & Units 03/06/2024    1:15 PM 02/02/2024    3:57 PM 01/23/2024    1:52 PM  CBC  WBC 4.0 - 10.5 K/uL 8.2  9.6  11.2   Hemoglobin 12.0 - 15.0 g/dL 8.8  7.3  7.8   Hematocrit 36.0 - 46.0 % 28.8  24.9  25.0   Platelets 150 - 400 K/uL 361  410  458       Latest Ref Rng & Units 01/23/2024    1:52 PM 12/22/2023    6:20 AM 12/21/2023    5:41 AM  CMP  Glucose 70 - 99 mg/dL 161  096  045   BUN 6 - 24 mg/dL 15  13  23    Creatinine 0.57 - 1.00 mg/dL 4.09  8.11  9.14   Sodium 134 - 144 mmol/L 139  139  137   Potassium 3.5 - 5.2 mmol/L 4.5  3.7  3.7   Chloride 96 - 106 mmol/L 98  105  104   CO2 20 - 29 mmol/L 24  25  23    Calcium 8.7 - 10.2 mg/dL 9.1  8.3  8.4   Total Protein 6.0 - 8.5 g/dL 7.9     Total Bilirubin 0.0 - 1.2 mg/dL 0.2     Alkaline Phos 44 - 121 IU/L 125     AST 0 - 40 IU/L 10     ALT 0 - 32 IU/L 5         RADIOGRAPHIC STUDIES: I have personally reviewed the radiological images as listed and agreed with the findings in the report. ECHOCARDIOGRAM COMPLETE Result Date: 12/20/2023    ECHOCARDIOGRAM REPORT   Patient Name:   Jessica Fields Date of Exam: 12/20/2023 Medical Rec #:  [id number]    Height:        66.0 in Accession #:    [id number]   Weight:       352.5 lb Date of Birth:  Aug 01, 1981    BSA:          2.544 m Patient Age:    42 years     BP:           107/55 mmHg Patient Gender: F            HR:           90 bpm. Exam Location:  [facility] Procedure: 2D Echo, Cardiac Doppler, Color Doppler and Strain Analysis (Both            Spectral and Color Flow Doppler were utilized during procedure). Indications:  Murmur  History:         Patient has no prior history of Echocardiogram examinations.                  Signs/Symptoms:Murmur; Risk Factors:Hypertension, Diabetes and                  Sleep Apnea.  Sonographer:     Clarke Crouch Referring Phys:  [id number] KHABIB DGAYLI Diagnosing Phys: Sammy Crisp MD  Sonographer Comments: Patient is obese. Global longitudinal strain was attempted. IMPRESSIONS  1. Left ventricular ejection fraction, by estimation, is 55 to 60%. The left ventricle has normal function. The left ventricle has no regional wall motion abnormalities. There is mild left ventricular hypertrophy. Left ventricular diastolic parameters were normal. The average left ventricular global longitudinal strain is -16.4 %. The global longitudinal strain is normal.  2. Right ventricular systolic function is normal. The right ventricular size is normal. There is mildly elevated pulmonary artery systolic pressure.  3. The mitral valve is abnormal. Trivial mitral valve regurgitation.  4. The aortic valve is tricuspid. Aortic valve regurgitation is not visualized. No aortic stenosis is present.  5. The inferior vena cava is dilated in size with >50% respiratory variability, suggesting right atrial pressure of 8 mmHg. FINDINGS  Left Ventricle: Left ventricular ejection fraction, by estimation, is 55 to 60%. The left ventricle has normal function. The left ventricle has no regional wall motion abnormalities. The average left ventricular global longitudinal strain is -16.4 %. Strain was performed and the global  longitudinal strain is normal. The left ventricular internal cavity size was normal in size. There is mild left ventricular hypertrophy. Left ventricular diastolic parameters were normal. Right Ventricle: The right ventricular size is normal. No increase in right ventricular wall thickness. Right ventricular systolic function is normal. There is mildly elevated pulmonary artery systolic pressure. The tricuspid regurgitant velocity is 2.81  m/s, and with an assumed right atrial pressure of 8 mmHg, the estimated right ventricular systolic pressure is 39.6 mmHg. Left Atrium: Left atrial size was normal in size. Right Atrium: Right atrial size was normal in size. Pericardium: There is no evidence of pericardial effusion. Mitral Valve: The mitral valve is abnormal. There is mild thickening of the mitral valve leaflet(s). Trivial mitral valve regurgitation. MV peak gradient, 8.3 mmHg. The mean mitral valve gradient is 4.0 mmHg. Tricuspid Valve: The tricuspid valve is normal in structure. Tricuspid valve regurgitation is mild. Aortic Valve: The aortic valve is tricuspid. Aortic valve regurgitation is not visualized. No aortic stenosis is present. Aortic valve mean gradient measures 8.0 mmHg. Aortic valve peak gradient measures 16.6 mmHg. Aortic valve area, by VTI measures 2.28  cm. Pulmonic Valve: The pulmonic valve was not well visualized. Pulmonic valve regurgitation is not visualized. No evidence of pulmonic stenosis. Aorta: The aortic root is normal in size and structure. Pulmonary Artery: The pulmonary artery is not well seen. Venous: The inferior vena cava is dilated in size with greater than 50% respiratory variability, suggesting right atrial pressure of 8 mmHg. IAS/Shunts: The interatrial septum was not well visualized.  LEFT VENTRICLE PLAX 2D LVIDd:         4.60 cm      Diastology LVIDs:         3.30 cm      LV e' medial:    13.50 cm/s LV PW:         1.24 cm      LV E/e' medial:  10.4 LV IVS:  1.00 cm       LV e' lateral:   16.20 cm/s LVOT diam:     2.00 cm      LV E/e' lateral: 8.7 LV SV:         90 LV SV Index:   35           2D Longitudinal Strain LVOT Area:     3.14 cm     2D Strain GLS Avg:     -16.4 %  LV Volumes (MOD) LV vol d, MOD A2C: 96.5 ml LV vol d, MOD A4C: 111.0 ml LV vol s, MOD A2C: 41.6 ml LV vol s, MOD A4C: 47.1 ml LV SV MOD A2C:     54.9 ml LV SV MOD A4C:     111.0 ml LV SV MOD BP:      63.2 ml RIGHT VENTRICLE RV Basal diam:  3.90 cm RV Mid diam:    3.40 cm RV S prime:     20.80 cm/s TAPSE (M-mode): 3.2 cm LEFT ATRIUM             Index        RIGHT ATRIUM           Index LA diam:        3.80 cm 1.49 cm/m   RA Area:     19.50 cm LA Vol (A2C):   60.1 ml 23.62 ml/m  RA Volume:   62.10 ml  24.41 ml/m LA Vol (A4C):   47.1 ml 18.51 ml/m LA Biplane Vol: 54.1 ml 21.26 ml/m  AORTIC VALVE                     PULMONIC VALVE AV Area (Vmax):    2.40 cm      PV Vmax:       1.38 m/s AV Area (Vmean):   2.17 cm      PV Peak grad:  7.6 mmHg AV Area (VTI):     2.28 cm AV Vmax:           204.00 cm/s AV Vmean:          131.000 cm/s AV VTI:            0.392 m AV Peak Grad:      16.6 mmHg AV Mean Grad:      8.0 mmHg LVOT Vmax:         156.00 cm/s LVOT Vmean:        90.300 cm/s LVOT VTI:          0.285 m LVOT/AV VTI ratio: 0.73  AORTA Ao Root diam: 2.90 cm MITRAL VALVE                TRICUSPID VALVE MV Area (PHT): 4.33 cm     TR Peak grad:   31.6 mmHg MV Area VTI:   2.43 cm     TR Vmax:        281.00 cm/s MV Peak grad:  8.3 mmHg MV Mean grad:  4.0 mmHg     SHUNTS MV Vmax:       1.44 m/s     Systemic VTI:  0.29 m MV Vmean:      94.0 cm/s    Systemic Diam: 2.00 cm MV Decel Time: 175 msec MV E velocity: 141.00 cm/s MV A velocity: 114.00 cm/s MV E/A ratio:  1.24 Jessica Fields End MD Electronically signed by Sammy Crisp MD Signature Date/Time: 12/20/2023/9:53:49 AM    Final  MR ABDOMEN W WO CONTRAST Result Date: 12/19/2023 CLINICAL DATA:  Adnexal mass, pelvic cystic lesion, low hemoglobin, concern for ruptured  cyst EXAM: MRI ABDOMEN AND PELVIS WITHOUT AND WITH CONTRAST TECHNIQUE: Multiplanar multisequence MR imaging of the abdomen and pelvis was performed both before and after the administration of intravenous contrast. CONTRAST:  10mL GADAVIST  GADOBUTROL  1 MMOL/ML IV SOLN COMPARISON:  12/09/2023 FINDINGS: COMBINED FINDINGS FOR BOTH MR ABDOMEN AND PELVIS Lower chest: No acute abnormality. Hepatobiliary: No focal liver abnormality is seen. Hepatomegaly, maximum coronal span 22.6 cm. Mild hepatic steatosis. Status post cholecystectomy. No biliary dilatation. Pancreas: Unremarkable. No pancreatic ductal dilatation or surrounding inflammatory changes. Spleen: Splenomegaly, maximum span 14.7 cm. Adrenals/Urinary Tract: Adrenal glands are unremarkable. Kidneys are normal, without renal calculi, solid lesion, or hydronephrosis. Bladder is unremarkable. Stomach/Bowel: Stomach is within normal limits. Appendix appears normal. No evidence of bowel wall thickening, distention, or inflammatory changes. Vascular/Lymphatic: No significant vascular findings are present. No enlarged abdominal or pelvic lymph nodes. Reproductive: Large cystic lesion in the pelvis has slightly increased in size, on today's examination measuring 11.7 x 11.1 cm, previously 10.5 x 10.0 cm when measured similarly (series 4, image 17). An internal septation has resolved, and this remains with homogeneous intrinsically T1 and T2 intermediate signal and is without internal contrast enhancement. As on prior examination, this is essentially inseparable from the posterior uterine fundus, and multiple uterine fibroids almost completely efface the normal endometrial cavity (series 12, image 21). No normal left ovarian tissue is visible. Normal right ovary with a small functional cyst or follicle requiring no further follow-up or characterization. IUD in the fundal endometrial cavity. Other: No abdominal wall hernia or abnormality. No ascites. Musculoskeletal: No acute  or significant osseous findings. IMPRESSION: 1. Large cystic lesion in the pelvis has slightly increased in size, on today's examination measuring 11.7 x 11.1 cm, previously 10.5 x 10.0 cm when measured similarly. An internal septation has resolved, and this remains with homogeneous intrinsically T1 and T2 intermediate signal and is without internal contrast enhancement. Given behavior over the short interval and patient demographic, a large hemorrhagic ovarian cyst or alternately an endometrioma strongly favored. 2. No acute findings in the abdomen. Specifically no evidence of free fluid in the abdomen or pelvis to suggest cyst rupture. Electronically Signed   By: Fredricka Jenny M.D.   On: 12/19/2023 07:03   MR PELVIS W WO CONTRAST Result Date: 12/19/2023 CLINICAL DATA:  Adnexal mass, pelvic cystic lesion, low hemoglobin, concern for ruptured cyst EXAM: MRI ABDOMEN AND PELVIS WITHOUT AND WITH CONTRAST TECHNIQUE: Multiplanar multisequence MR imaging of the abdomen and pelvis was performed both before and after the administration of intravenous contrast. CONTRAST:  10mL GADAVIST  GADOBUTROL  1 MMOL/ML IV SOLN COMPARISON:  12/09/2023 FINDINGS: COMBINED FINDINGS FOR BOTH MR ABDOMEN AND PELVIS Lower chest: No acute abnormality. Hepatobiliary: No focal liver abnormality is seen. Hepatomegaly, maximum coronal span 22.6 cm. Mild hepatic steatosis. Status post cholecystectomy. No biliary dilatation. Pancreas: Unremarkable. No pancreatic ductal dilatation or surrounding inflammatory changes. Spleen: Splenomegaly, maximum span 14.7 cm. Adrenals/Urinary Tract: Adrenal glands are unremarkable. Kidneys are normal, without renal calculi, solid lesion, or hydronephrosis. Bladder is unremarkable. Stomach/Bowel: Stomach is within normal limits. Appendix appears normal. No evidence of bowel wall thickening, distention, or inflammatory changes. Vascular/Lymphatic: No significant vascular findings are present. No enlarged abdominal or  pelvic lymph nodes. Reproductive: Large cystic lesion in the pelvis has slightly increased in size, on today's examination measuring 11.7 x 11.1 cm, previously 10.5 x 10.0 cm  when measured similarly (series 4, image 17). An internal septation has resolved, and this remains with homogeneous intrinsically T1 and T2 intermediate signal and is without internal contrast enhancement. As on prior examination, this is essentially inseparable from the posterior uterine fundus, and multiple uterine fibroids almost completely efface the normal endometrial cavity (series 12, image 21). No normal left ovarian tissue is visible. Normal right ovary with a small functional cyst or follicle requiring no further follow-up or characterization. IUD in the fundal endometrial cavity. Other: No abdominal wall hernia or abnormality. No ascites. Musculoskeletal: No acute or significant osseous findings. IMPRESSION: 1. Large cystic lesion in the pelvis has slightly increased in size, on today's examination measuring 11.7 x 11.1 cm, previously 10.5 x 10.0 cm when measured similarly. An internal septation has resolved, and this remains with homogeneous intrinsically T1 and T2 intermediate signal and is without internal contrast enhancement. Given behavior over the short interval and patient demographic, a large hemorrhagic ovarian cyst or alternately an endometrioma strongly favored. 2. No acute findings in the abdomen. Specifically no evidence of free fluid in the abdomen or pelvis to suggest cyst rupture. Electronically Signed   By: Fredricka Jenny M.D.   On: 12/19/2023 07:03   DG Chest 2 View Result Date: 12/18/2023 CLINICAL DATA:  Fever and chills, weakness EXAM: CHEST - 2 VIEW COMPARISON:  None Available. FINDINGS: The heart size and mediastinal contours are within normal limits. Both lungs are clear. The visualized skeletal structures are unremarkable. IMPRESSION: No active cardiopulmonary disease. Electronically Signed   By: Bobbye Burrow M.D.   On: 12/18/2023 18:26   MR PELVIS W WO CONTRAST Result Date: 12/09/2023 CLINICAL DATA:  Adnexal mass, malignancy suspected, cystic lesion of the pelvis identified by prior ultrasound EXAM: MRI ABDOMEN AND PELVIS WITHOUT AND WITH CONTRAST TECHNIQUE: Multiplanar multisequence MR imaging of the abdomen and pelvis was performed both before and after the administration of intravenous contrast. CONTRAST:  10mL GADAVIST  GADOBUTROL  1 MMOL/ML IV SOLN COMPARISON:  None Available. FINDINGS: COMBINED FINDINGS FOR BOTH MR ABDOMEN AND PELVIS Lower chest: No acute abnormality. Hepatobiliary: No solid liver abnormality is seen. Hepatomegaly, maximum coronal span 21.9 cm. Status post cholecystectomy. Postoperative biliary ductal dilatation Pancreas: Unremarkable. No pancreatic ductal dilatation or surrounding inflammatory changes. Spleen: Splenomegaly, maximum span 14.6 cm. Adrenals/Urinary Tract: Adrenal glands are unremarkable. Kidneys are normal, without renal calculi, solid lesion, or hydronephrosis. Bladder is unremarkable. Stomach/Bowel: Stomach is within normal limits. Appendix appears normal. No evidence of bowel wall thickening, distention, or inflammatory changes. Vascular/Lymphatic: No significant vascular findings are present. No enlarged abdominal or pelvic lymph nodes. Reproductive: IUD in the endometrial cavity. Contrast enhancing fibroids which distort and almost completely efface endometrial cavity. At the posterior aspect of the uterine fundus and distorting the contour of the uterus, there is a large, septated lesion measuring 10.6 x 10.2 x 9.2 cm (series 24, image 15, series 26, image 18). This demonstrates heterogeneous internal T1 and T2 intermediate signal and by manual measurements (subtraction images not provided for review), this does not appear to enhance on multiphasic sequences. Normal left ovarian tissue is not clearly visualized. Normal right ovary with a small, benign, functional cysts  requiring no specific further follow-up or characterization. Other: No abdominal wall hernia or abnormality. No ascites. Musculoskeletal: No acute or significant osseous findings. IMPRESSION: 1. At the posterior aspect of the uterine fundus and distorting the contour of the uterus, there is a large, septated lesion measuring 10.6 x 10.2 x 9.2 cm. This demonstrates  heterogeneous internal T1 and T2 intermediate and does not appear to enhance on multiphasic sequences. Given intimate relationship to the uterus this may reflect a large degenerated fibroid, or alternately a large hemorrhagic left ovarian cyst or endometrioma. Cystic ovarian malignancy not generally excluded but not favored. 2. Normal left ovarian tissue is not clearly visualized. 3. Normal right ovary with a small, benign, functional cyst requiring no specific further follow-up or characterization. 4. IUD in the endometrial cavity. Non degenerated uterine fibroids which distort and almost completely efface endometrial cavity. Electronically Signed   By: Fredricka Jenny M.D.   On: 12/09/2023 15:50   MR Abdomen W or Wo Contrast Result Date: 12/09/2023 CLINICAL DATA:  Adnexal mass, malignancy suspected, cystic lesion of the pelvis identified by prior ultrasound EXAM: MRI ABDOMEN AND PELVIS WITHOUT AND WITH CONTRAST TECHNIQUE: Multiplanar multisequence MR imaging of the abdomen and pelvis was performed both before and after the administration of intravenous contrast. CONTRAST:  10mL GADAVIST  GADOBUTROL  1 MMOL/ML IV SOLN COMPARISON:  None Available. FINDINGS: COMBINED FINDINGS FOR BOTH MR ABDOMEN AND PELVIS Lower chest: No acute abnormality. Hepatobiliary: No solid liver abnormality is seen. Hepatomegaly, maximum coronal span 21.9 cm. Status post cholecystectomy. Postoperative biliary ductal dilatation Pancreas: Unremarkable. No pancreatic ductal dilatation or surrounding inflammatory changes. Spleen: Splenomegaly, maximum span 14.6 cm. Adrenals/Urinary  Tract: Adrenal glands are unremarkable. Kidneys are normal, without renal calculi, solid lesion, or hydronephrosis. Bladder is unremarkable. Stomach/Bowel: Stomach is within normal limits. Appendix appears normal. No evidence of bowel wall thickening, distention, or inflammatory changes. Vascular/Lymphatic: No significant vascular findings are present. No enlarged abdominal or pelvic lymph nodes. Reproductive: IUD in the endometrial cavity. Contrast enhancing fibroids which distort and almost completely efface endometrial cavity. At the posterior aspect of the uterine fundus and distorting the contour of the uterus, there is a large, septated lesion measuring 10.6 x 10.2 x 9.2 cm (series 24, image 15, series 26, image 18). This demonstrates heterogeneous internal T1 and T2 intermediate signal and by manual measurements (subtraction images not provided for review), this does not appear to enhance on multiphasic sequences. Normal left ovarian tissue is not clearly visualized. Normal right ovary with a small, benign, functional cysts requiring no specific further follow-up or characterization. Other: No abdominal wall hernia or abnormality. No ascites. Musculoskeletal: No acute or significant osseous findings. IMPRESSION: 1. At the posterior aspect of the uterine fundus and distorting the contour of the uterus, there is a large, septated lesion measuring 10.6 x 10.2 x 9.2 cm. This demonstrates heterogeneous internal T1 and T2 intermediate and does not appear to enhance on multiphasic sequences. Given intimate relationship to the uterus this may reflect a large degenerated fibroid, or alternately a large hemorrhagic left ovarian cyst or endometrioma. Cystic ovarian malignancy not generally excluded but not favored. 2. Normal left ovarian tissue is not clearly visualized. 3. Normal right ovary with a small, benign, functional cyst requiring no specific further follow-up or characterization. 4. IUD in the endometrial  cavity. Non degenerated uterine fibroids which distort and almost completely efface endometrial cavity. Electronically Signed   By: Fredricka Jenny M.D.   On: 12/09/2023 15:50   US  PELVIC COMPLETE W TRANSVAGINAL AND TORSION R/O Result Date: 12/09/2023 CLINICAL DATA:  Lower abdominal pain, vaginal bleeding for 10 days. EXAM: TRANSABDOMINAL AND TRANSVAGINAL ULTRASOUND OF PELVIS DOPPLER ULTRASOUND OF OVARIES TECHNIQUE: Both transabdominal and transvaginal ultrasound examinations of the pelvis were performed. Transabdominal technique was performed for global imaging of the pelvis including uterus, ovaries, adnexal regions, and  pelvic cul-de-sac. It was necessary to proceed with endovaginal exam following the transabdominal exam to visualize the endometrium and ovaries. Color and duplex Doppler ultrasound was utilized to evaluate blood flow to the ovaries. COMPARISON:  March 24, 2009. FINDINGS: Uterus Measurements: 6.2 x 3.8 x 3.1 cm = volume: 38 mL. Probable fibroid measuring 4.9 x 4.7 x 4.0 cm is noted posteriorly. Endometrium Thickness: 5 mm which is within normal limits. No focal abnormality visualized. Right ovary Not clearly visualized. Left ovary Not clearly visualized. Pulsed Doppler evaluation of cystic mass posteriorly and pelvis demonstrates normal low-resistance arterial and venous waveforms. Other findings 9.3 x 9.1 x 8.7 cm mildly complex cystic lesion is noted posteriorly in the midline of the pelvis. This may be ovarian in etiology, but it cannot be determined from which ovary it arises, if any. Potentially may represent endometrioma. IMPRESSION: Ovaries are not clearly visualized and therefore ovarian torsion cannot be excluded on the basis of this exam. 9.3 x 9.1 x 8.7 cm mildly complex cystic lesion is noted posteriorly in the midline of the pelvis. This may be ovarian etiology, but it cannot be determined from which ovary it arises, if any. Potentially this may represent endometrioma. MRI is  recommended for further evaluation. Probable 4.9 cm fibroid noted posteriorly and uterus. Electronically Signed   By: Rosalene Colon M.D.   On: 12/09/2023 13:07

## 2024-03-09 LAB — BCR-ABL1 FISH
Cells Analyzed: 200
Cells Counted: 200

## 2024-03-13 ENCOUNTER — Inpatient Hospital Stay

## 2024-03-13 VITALS — BP 116/43 | HR 69 | Temp 97.3°F | Resp 18

## 2024-03-13 DIAGNOSIS — D508 Other iron deficiency anemias: Secondary | ICD-10-CM

## 2024-03-13 DIAGNOSIS — D509 Iron deficiency anemia, unspecified: Secondary | ICD-10-CM | POA: Diagnosis not present

## 2024-03-13 MED ORDER — IRON SUCROSE 20 MG/ML IV SOLN
200.0000 mg | Freq: Once | INTRAVENOUS | Status: AC
  Administered 2024-03-13: 200 mg via INTRAVENOUS
  Filled 2024-03-13: qty 10

## 2024-03-13 NOTE — Patient Instructions (Signed)

## 2024-03-13 NOTE — Progress Notes (Signed)
 Declined post-observation. Aware of risks. Vitals stable at discharge.

## 2024-03-14 ENCOUNTER — Encounter: Payer: Self-pay | Admitting: Oncology

## 2024-03-14 ENCOUNTER — Encounter: Payer: Self-pay | Admitting: Urology

## 2024-03-14 ENCOUNTER — Ambulatory Visit (INDEPENDENT_AMBULATORY_CARE_PROVIDER_SITE_OTHER): Admitting: Urology

## 2024-03-14 ENCOUNTER — Telehealth: Payer: Self-pay | Admitting: Family Medicine

## 2024-03-14 ENCOUNTER — Other Ambulatory Visit: Payer: Self-pay | Admitting: Family Medicine

## 2024-03-14 VITALS — BP 132/85 | HR 75 | Ht 66.0 in | Wt 325.0 lb

## 2024-03-14 DIAGNOSIS — R102 Pelvic and perineal pain: Secondary | ICD-10-CM | POA: Diagnosis not present

## 2024-03-14 DIAGNOSIS — R399 Unspecified symptoms and signs involving the genitourinary system: Secondary | ICD-10-CM | POA: Diagnosis not present

## 2024-03-14 DIAGNOSIS — R1011 Right upper quadrant pain: Secondary | ICD-10-CM

## 2024-03-14 LAB — JAK2 V617F RFX CALR/MPL/E12-15

## 2024-03-14 LAB — MICROSCOPIC EXAMINATION: Epithelial Cells (non renal): >10 /HPF — AB (ref 0–10)

## 2024-03-14 LAB — URINALYSIS, COMPLETE
Bilirubin, UA: NEGATIVE
Glucose, UA: NEGATIVE
Ketones, UA: NEGATIVE
Leukocytes,UA: NEGATIVE
Nitrite, UA: NEGATIVE
Protein,UA: NEGATIVE
RBC, UA: NEGATIVE
Specific Gravity, UA: 1.02 (ref 1.005–1.030)
Urobilinogen, Ur: 0.2 mg/dL (ref 0.2–1.0)
pH, UA: 6 (ref 5.0–7.5)

## 2024-03-14 LAB — CALR +MPL + E12-E15  (REFLEX)

## 2024-03-14 MED ORDER — GEMTESA 75 MG PO TABS: 75.0000 mg | ORAL_TABLET | Freq: Every day | ORAL | Status: DC

## 2024-03-14 NOTE — Telephone Encounter (Signed)
 Copied from CRM [id number]. Topic: Referral - Request for Referral >> Mar 14, 2024  4:03 PM Turkey B wrote: Did the patient discuss referral with their provider in the last year? yes    Type of order/referral and detailed reason for visit: kidney Dr. Francetta Innocent says Dr Shann Darnel , sent her to urologist she went too and they want her to see a  kidney Dr, pt has pain and feels full when urinating, has to go every 1/1/2  Preference of office, provider, location: [facility]  If referral order, have you been seen by this specialty before? no  Can we respond through MyChart? Yes  I spoke to Khara about this message that was taken.  Basically Dr. Wilhelmenia Harada put in Merrily's  mychart that she had Stage III kidney disease.   Patient didn't know anything about this until she read mychart.    Westlynn seen Dr. Cherylene Corrente today for urinary frequency but he didn't mention the kidney disease.  Patient wants to know if she should be referred to [facility] for this issue.   Aaron Aas

## 2024-03-14 NOTE — Progress Notes (Signed)
 I, Maysun Jamey Mccallum, acting as a Neurosurgeon for Geraline Knapp, MD., have documented all relevant documentation on the behalf of Geraline Knapp, MD, as directed by Geraline Knapp, MD while in the presence of Geraline Knapp, MD.  Discussed the use of AI scribe software for clinical note transcription with the patient, who gave verbal consent to proceed.    03/14/2024 2:45 PM   Ashanta J Escoe 10/07/1981 [id number]  Referring provider: Lamon Pillow, MD [address] [place],  [place] [address]  Chief Complaint  Patient presents with   Establish Care   Urinary Retention    HPI: Jessica Fields is a 43 y.o. female referred for incomplete bladder emptying.   Seen in the ED 12/09/23 for lower abdominal pain and vaginal bleeding. Urinalysis showed 11-20 RBC, with no significant squamous epithelial cells. Pelvic ultrasound showed a 9 cm complex cystic lesion in the pelvis, and subsequently underwent MRI of the pelvis which showed a 10 cm septated cystic lesion. Additional ED visit 12/18/23 with fever, chills, and admitted for a septic shock secondary to UTI. She was discharged on 12/22/23.   After her bout of sepsis, she has complained of urinary frequency, bladder spasms, and lower abdominal discomfort. No gross hematuria.  Has seen gynecologic oncology and states she will have surgery on her pelvic mass in the near future   PMH: Past Medical History:  Diagnosis Date   History of chicken pox    History of cholecystitis     Surgical History: Past Surgical History:  Procedure Laterality Date   Sleep Study  01/25/2005   Performed at [facility] by Dr. Clive Dancer. Interpretation: very severe Sleep Apnea with nocturnal desaturations. She was improved with nasal Bi-PAP, although was not titrated to optimal pressure. I suspect she has nasal sinus obstruction or congestion    Home Medications:  Allergies as of 03/14/2024       Reactions   Penicillins Hives         Medication List        Accurate as of Mar 14, 2024  2:45 PM. If you have any questions, ask your nurse or doctor.          amLODipine  10 MG tablet Commonly known as: NORVASC  TAKE 1 TABLET(10 MG) BY MOUTH DAILY   celecoxib  200 MG capsule Commonly known as: CELEBREX  TAKE 1 CAPSULE BY MOUTH TWICE DAILY   diclofenac  sodium 1 % Gel Commonly known as: VOLTAREN  Apply 4 g topically 4 (four) times daily as needed.   fluticasone 50 MCG/ACT nasal spray Commonly known as: FLONASE Place 2 sprays into both nostrils daily as needed.   Gemtesa 75 MG Tabs Generic drug: Vibegron Take 1 tablet (75 mg total) by mouth daily. Started by: Geraline Knapp   lisinopril -hydrochlorothiazide  10-12.5 MG tablet Commonly known as: ZESTORETIC  TAKE 1 TABLET BY MOUTH DAILY   Melatonin 10 MG Tabs Take 10 mg by mouth at bedtime as needed.   metFORMIN  500 MG tablet Commonly known as: GLUCOPHAGE  TAKE 1 TABLET(500 MG) BY MOUTH THREE TIMES DAILY WITH MEALS   omeprazole  20 MG capsule Commonly known as: PRILOSEC TAKE 1 CAPSULE(20 MG) BY MOUTH DAILY   oxyCODONE -acetaminophen  7.5-325 MG tablet Commonly known as: PERCOCET Take 2 tablets by mouth every 6 (six) hours as needed for severe pain (pain score 7-10).   phenazopyridine  200 MG tablet Commonly known as: Pyridium  Take 1 tablet (200 mg total) by mouth 3 (three) times daily as needed  for pain.   sertraline  50 MG tablet Commonly known as: ZOLOFT  TAKE 1 TABLET(50 MG) BY MOUTH DAILY   tirzepatide  10 MG/0.5ML Pen Commonly known as: MOUNJARO  Inject 10 mg into the skin once a week.        Allergies:  Allergies  Allergen Reactions   Penicillins Hives    Family History: Family History  Problem Relation Age of Onset   Anemia Mother    Anemia Other    Breast cancer Neg Hx    Colon cancer Neg Hx     Social History:  reports that she has never smoked. She has never used smokeless tobacco. She reports current alcohol use. She reports that  she does not use drugs.   Physical Exam: BP 132/85   Pulse 75   Ht 5\' 6"  (1.676 m)   Wt (!) 325 lb (147.4 kg)   BMI 52.46 kg/m   Constitutional:  Alert and oriented, No acute distress. HEENT: [place] AT Respiratory: Normal respiratory effort, no increased work of breathing. Psychiatric: Normal mood and affect.   Urinalysis Dipstick/microscopy negative   Pertinent Imaging: MRI pelvis reviewed, large cystic pelvic mass. Bladder is not distended.    Assessment & Plan:    1. Pelvic pain with lower urinary tract symptoms Due to large cystic pelvic mass, do not think a bladder scan will be reliable.  Trial Gemtesa 75 mg daily- samples given.  Follow-up for cystoscopy and will perform PVR at time of cystoscopy.  I have reviewed the above documentation for accuracy and completeness, and I agree with the above.   Geraline Knapp, MD   [facility] [address] [place], [place] [address] [phone]

## 2024-03-14 NOTE — Telephone Encounter (Unsigned)
 Copied from CRM [id number]. Topic: Clinical - Medication Refill >> Mar 14, 2024  3:59 PM Turkey B wrote: Medication: oxyCODONE -acetaminophen  (PERCOCET) 7.5-325 MG tablet  Has the patient contacted their pharmacy? No/ because its Oxy  This is the patient's preferred pharmacy:  [facility][id number] [place], [place] - [address] AT [facility] OF [address] & [address] [place] [address] Phone: [phone] Fax: [phone]  Is this the correct pharmacy for this prescription? yes   Has the prescription been filled recently? 15 days ago  Is the patient out of the medication? yes  Has the patient been seen for an appointment in the last year OR does the patient have an upcoming appointment? yes  Can we respond through MyChart? yes  Agent: Please be advised that Rx refills may take up to 3 business days. We ask that you follow-up with your pharmacy.

## 2024-03-15 ENCOUNTER — Encounter: Payer: Self-pay | Admitting: Urology

## 2024-03-16 ENCOUNTER — Telehealth: Payer: Self-pay

## 2024-03-16 ENCOUNTER — Other Ambulatory Visit: Payer: Self-pay

## 2024-03-16 DIAGNOSIS — R1011 Right upper quadrant pain: Secondary | ICD-10-CM

## 2024-03-16 DIAGNOSIS — E1169 Type 2 diabetes mellitus with other specified complication: Secondary | ICD-10-CM

## 2024-03-16 MED ORDER — TIRZEPATIDE 10 MG/0.5ML ~~LOC~~ SOAJ: 10.0000 mg | SUBCUTANEOUS | 1 refills | Status: DC

## 2024-03-16 MED ORDER — OXYCODONE-ACETAMINOPHEN 7.5-325 MG PO TABS: 2.0000 | ORAL_TABLET | Freq: Four times a day (QID) | ORAL | 0 refills | Status: DC | PRN

## 2024-03-16 NOTE — Telephone Encounter (Signed)
 Called patient to advised provider recommendation, no response. Left message to call back.

## 2024-03-16 NOTE — Telephone Encounter (Signed)
 Her kidney functions have been fluctuating since she was in the hospital with sepsis in March. It's been about normal the last few times it was checked. We need to recheck in a few months. Nephrologist wouldn't do anything different at this point.

## 2024-03-16 NOTE — Telephone Encounter (Signed)
 Requested medications are due for refill today.  yes  Requested medications are on the active medications list.  yes  Last refill. 02/26/2024 #120 0 rf  Future visit scheduled.   no  Notes to clinic.  Refill not delegated.    Requested Prescriptions  Pending Prescriptions Disp Refills   oxyCODONE -acetaminophen  (PERCOCET) 7.5-325 MG tablet 120 tablet 0    Sig: Take 2 tablets by mouth every 6 (six) hours as needed for severe pain (pain score 7-10).     Not Delegated - Analgesics:  Opioid Agonist Combinations Failed - 03/16/2024  2:14 PM      Failed - This refill cannot be delegated      Failed - Urine Drug Screen completed in last 360 days      Failed - Valid encounter within last 3 months    Recent Outpatient Visits           1 month ago Incomplete emptying of bladder   [facility] Lamon Pillow, MD   2 months ago Hx of urinary infection    [facility] Lamon Pillow, MD   3 months ago Vaginal bleeding   [facility] Lamon Pillow, MD

## 2024-03-16 NOTE — Telephone Encounter (Signed)
 Patient advised.

## 2024-03-20 ENCOUNTER — Inpatient Hospital Stay: Attending: Oncology

## 2024-03-20 VITALS — BP 116/61 | HR 80 | Temp 97.1°F | Resp 18

## 2024-03-20 DIAGNOSIS — D509 Iron deficiency anemia, unspecified: Secondary | ICD-10-CM | POA: Diagnosis present

## 2024-03-20 DIAGNOSIS — D508 Other iron deficiency anemias: Secondary | ICD-10-CM

## 2024-03-20 DIAGNOSIS — Z79899 Other long term (current) drug therapy: Secondary | ICD-10-CM | POA: Diagnosis not present

## 2024-03-20 MED ORDER — SODIUM CHLORIDE 0.9% FLUSH
10.0000 mL | Freq: Once | INTRAVENOUS | Status: AC | PRN
  Administered 2024-03-20: 10 mL
  Filled 2024-03-20: qty 10

## 2024-03-20 MED ORDER — IRON SUCROSE 20 MG/ML IV SOLN
200.0000 mg | Freq: Once | INTRAVENOUS | Status: AC
  Administered 2024-03-20: 200 mg via INTRAVENOUS
  Filled 2024-03-20: qty 10

## 2024-04-02 ENCOUNTER — Other Ambulatory Visit: Payer: Self-pay | Admitting: Family Medicine

## 2024-04-02 DIAGNOSIS — R1011 Right upper quadrant pain: Secondary | ICD-10-CM

## 2024-04-02 NOTE — Telephone Encounter (Unsigned)
 Copied from CRM [id number]. Topic: Clinical - Medication Refill >> Apr 02, 2024 12:15 PM Tiffany S wrote: Medication: oxyCODONE -acetaminophen  (PERCOCET) 7.5-325 MG tablet [[id number]]  Has the patient contacted their pharmacy? Yes (Agent: If no, request that the patient contact the pharmacy for the refill. If patient does not wish to contact the pharmacy document the reason why and proceed with request.) (Agent: If yes, when and what did the pharmacy advise?)  This is the patient's preferred pharmacy:  [facility] #[id number] [place], [place] - [address] AT [facility] OF [address] [place] [address] Phone: [phone] Fax: [phone]  Is this the correct pharmacy for this prescription? Yes If no, delete pharmacy and type the correct one.   Has the prescription been filled recently? Yes  Is the patient out of the medication? Yes  Has the patient been seen for an appointment in the last year OR does the patient have an upcoming appointment? Yes  Can we respond through MyChart? Yes  Agent: Please be advised that Rx refills may take up to 3 business days. We ask that you follow-up with your pharmacy.

## 2024-04-03 MED ORDER — OXYCODONE-ACETAMINOPHEN 7.5-325 MG PO TABS: 2.0000 | ORAL_TABLET | Freq: Four times a day (QID) | ORAL | 0 refills | Status: DC | PRN

## 2024-04-06 ENCOUNTER — Inpatient Hospital Stay

## 2024-04-06 DIAGNOSIS — D508 Other iron deficiency anemias: Secondary | ICD-10-CM

## 2024-04-06 DIAGNOSIS — D509 Iron deficiency anemia, unspecified: Secondary | ICD-10-CM | POA: Diagnosis not present

## 2024-04-06 LAB — CBC WITH DIFFERENTIAL (CANCER CENTER ONLY)
Abs Immature Granulocytes: 0.03 10*3/uL (ref 0.00–0.07)
Basophils Absolute: 0.1 10*3/uL (ref 0.0–0.1)
Basophils Relative: 1 %
Eosinophils Absolute: 0.2 10*3/uL (ref 0.0–0.5)
Eosinophils Relative: 3 %
HCT: 35.2 % — ABNORMAL LOW (ref 36.0–46.0)
Hemoglobin: 10.8 g/dL — ABNORMAL LOW (ref 12.0–15.0)
Immature Granulocytes: 0 %
Lymphocytes Relative: 20 %
Lymphs Abs: 1.8 10*3/uL (ref 0.7–4.0)
MCH: 25.5 pg — ABNORMAL LOW (ref 26.0–34.0)
MCHC: 30.7 g/dL (ref 30.0–36.0)
MCV: 83.2 fL (ref 80.0–100.0)
Monocytes Absolute: 0.7 10*3/uL (ref 0.1–1.0)
Monocytes Relative: 7 %
Neutro Abs: 6 10*3/uL (ref 1.7–7.7)
Neutrophils Relative %: 69 %
Platelet Count: 361 10*3/uL (ref 150–400)
RBC: 4.23 MIL/uL (ref 3.87–5.11)
RDW: 15.2 % (ref 11.5–15.5)
WBC Count: 8.7 10*3/uL (ref 4.0–10.5)
nRBC: 0 % (ref 0.0–0.2)

## 2024-04-06 LAB — FERRITIN: Ferritin: 244 ng/mL (ref 11–307)

## 2024-04-06 LAB — IRON AND TIBC
Iron: 30 ug/dL (ref 28–170)
Saturation Ratios: 10 % — ABNORMAL LOW (ref 10.4–31.8)
TIBC: 307 ug/dL (ref 250–450)
UIBC: 277 ug/dL

## 2024-04-06 LAB — RETIC PANEL
Immature Retic Fract: 1.6 % — ABNORMAL LOW (ref 2.3–15.9)
RBC.: 4.2 MIL/uL (ref 3.87–5.11)
Retic Count, Absolute: 39.1 10*3/uL (ref 19.0–186.0)
Retic Ct Pct: 0.9 % (ref 0.4–3.1)
Reticulocyte Hemoglobin: 29.3 pg (ref >27.9)

## 2024-04-08 LAB — MISC LABCORP TEST (SEND OUT): Labcorp test code: 143305

## 2024-04-10 ENCOUNTER — Inpatient Hospital Stay (HOSPITAL_BASED_OUTPATIENT_CLINIC_OR_DEPARTMENT_OTHER): Admitting: Oncology

## 2024-04-10 ENCOUNTER — Encounter: Payer: Self-pay | Admitting: Oncology

## 2024-04-10 ENCOUNTER — Inpatient Hospital Stay

## 2024-04-10 VITALS — BP 133/80 | HR 71 | Temp 97.0°F | Resp 18 | Wt 320.4 lb

## 2024-04-10 VITALS — BP 120/70 | HR 75

## 2024-04-10 DIAGNOSIS — D508 Other iron deficiency anemias: Secondary | ICD-10-CM | POA: Diagnosis not present

## 2024-04-10 DIAGNOSIS — N1831 Chronic kidney disease, stage 3a: Secondary | ICD-10-CM | POA: Diagnosis not present

## 2024-04-10 DIAGNOSIS — R1900 Intra-abdominal and pelvic swelling, mass and lump, unspecified site: Secondary | ICD-10-CM

## 2024-04-10 DIAGNOSIS — D509 Iron deficiency anemia, unspecified: Secondary | ICD-10-CM | POA: Diagnosis not present

## 2024-04-10 MED ORDER — IRON SUCROSE 20 MG/ML IV SOLN
200.0000 mg | Freq: Once | INTRAVENOUS | Status: AC
  Administered 2024-04-10: 200 mg via INTRAVENOUS

## 2024-04-10 MED ORDER — SODIUM CHLORIDE 0.9% FLUSH
10.0000 mL | Freq: Once | INTRAVENOUS | Status: AC | PRN
  Administered 2024-04-10: 10 mL
  Filled 2024-04-10: qty 10

## 2024-04-10 NOTE — Progress Notes (Signed)
 Hematology/Oncology Progress note [phone] [phone]           REFERRING PROVIDER: Gasper Nancyann BRAVO, MD   CHIEF COMPLAINTS/REASON FOR VISIT:  iron  deficiency anemia    ASSESSMENT & PLAN:   Iron  deficiency anemia Labs are reviewed and discussed with patient. Lab Results  Component Value Date   HGB 10.8 (L) 04/06/2024   TIBC 307 04/06/2024   IRONPCTSAT 10 (L) 04/06/2024   FERRITIN 244 04/06/2024    Hemoglobin and iron  panel have both improved.  Persistently decreased iron  saturation.   Recommend additional IV Venofer  weekly x 2 to further increase iron  stores. No need for Retacrit.  Pelvic mass large hemorrhagic ovarian cyst or endometrioma  Recommend patient to continue follow-up with gynecology oncology Dr. Elby  CKD (chronic kidney disease) stage 3, GFR 30-59 ml/min (HCC) Encourage oral hydration and avoid nephrotoxins.  No M protein of protein electrophoresis     Orders Placed This Encounter  Procedures   CBC with Differential (Cancer Center Only)    Standing Status:   Future    Expected Date:   08/10/2024    Expiration Date:   11/08/2024   Iron  and TIBC    Standing Status:   Future    Expected Date:   08/10/2024    Expiration Date:   11/08/2024   Ferritin    Standing Status:   Future    Expected Date:   08/10/2024    Expiration Date:   11/08/2024   Retic Panel    Standing Status:   Future    Expected Date:   08/10/2024    Expiration Date:   11/08/2024   Follow-up in 4 months All questions were answered. The patient knows to call the clinic with any problems, questions or concerns.  Zelphia Cap, MD, PhD [facility] Hematology Oncology 04/10/2024   HISTORY OF PRESENTING ILLNESS:   Jessica Fields is a  43 y.o.  female with PMH listed below was seen in consultation at the request of  Gasper Nancyann BRAVO, MD  for evaluation of iron  deficiency anemia  12/09/2023, patient presented to ER for evaluation of abdominal pain. Ultrasound  pelvis showed 9.3 x 9.1 x 8.7 cm mildly complex cystic lesion. MRI pelvis with and without contrast showed 1. At the posterior aspect of the uterine fundus and distorting the contour of the uterus, there is a large, septated lesion measuring 10.6 x 10.2 x 9.2 cm. 2. Normal left ovarian tissue is not clearly visualized. 3. Normal right ovary with a small, benign, functional cyst requiring no specific further follow-up or characterization. 4. IUD in the endometrial cavity. Non degenerated uterine fibroids which distort and almost completely efface endometrial cavity.  12/09/2023, patient was found to have a hemoglobin of 8.2, his level has decreased from her baseline of 10.9 in September 2024.  MCV was 76.2.  She also had a white count of 17.9.  Platelet count of 5 3 7. Iron  panel showed decreased iron  saturation to 7, TIBC 227.  No ferritin was checked.  Patient was seen by gynecologist for pelvic mass evaluation. Patient reports that her gynecologist arrange her to get 3 IV Feraheme treatments on 12/16/2023, 12/26/2023 and 01/04/2024.  12/18/2023 - 12/22/2023, patient was admitted due to sepsis secondary to urinary tract infection.  She initially required vasopressors.  Patient was treated with IV antibiotics and discharged on 12/22/2023.  Her hemoglobin was low during this hospitalization.  She reports having 3 units of PRBC transfusions  She was seen  by Dr. Elby during her admission and followed up outpatient at [facility].  Dr. Elby recommended observation and repeat imaging in July 2025.   She has a history of an IUD and reports only light menstrual periods occasionally. No stomach pain, changes in bowel habits, or blood in the stool. Her medications include Celebrex  for arthritis, omeprazole  for acid reflux, metformin  for diabetes, and Zoloft  for mild depression and anxiety.   INTERVAL HISTORY Jessica Fields is a 43 y.o. female who has above history reviewed by me today presents for follow up visit for  anemia. Patient has tolerated IV Venofer  treatments.  She did not notice any side effects.  Patient reports that fatigue has improved.  MEDICAL HISTORY:  Past Medical History:  Diagnosis Date   History of chicken pox    History of cholecystitis     SURGICAL HISTORY: Past Surgical History:  Procedure Laterality Date   Sleep Study  01/25/2005   Performed at [facility] by Dr. Dante. Interpretation: very severe Sleep Apnea with nocturnal desaturations. She was improved with nasal Bi-PAP, although was not titrated to optimal pressure. I suspect she has nasal sinus obstruction or congestion    SOCIAL HISTORY: Social History   Socioeconomic History   Marital status: Married    Spouse name: Not on file   Number of children: 0   Years of education: Not on file   Highest education level: Some college, no degree  Occupational History   Occupation: Transport planner  Tobacco Use   Smoking status: Never   Smokeless tobacco: Never  Substance and Sexual Activity   Alcohol use: Yes    Alcohol/week: 0.0 standard drinks of alcohol    Comment: occasional use   Drug use: No   Sexual activity: Not on file  Other Topics Concern   Not on file  Social History Narrative   Not on file   Social Drivers of Health   Financial Resource Strain: Low Risk  (01/23/2024)   Overall Financial Resource Strain (CARDIA)    Difficulty of Paying Living Expenses: Not very hard  Food Insecurity: No Food Insecurity (01/23/2024)   Hunger Vital Sign    Worried About Running Out of Food in the Last Year: Never true    Ran Out of Food in the Last Year: Never true  Transportation Needs: No Transportation Needs (01/23/2024)   PRAPARE - Administrator, Civil Service (Medical): No    Lack of Transportation (Non-Medical): No  Physical Activity: Insufficiently Active (01/23/2024)   Exercise Vital Sign    Days of Exercise per Week: 2 days    Minutes of Exercise per Session: 20 min  Stress: No Stress Concern  Present (01/23/2024)   [employer] of Occupational Health - Occupational Stress Questionnaire    Feeling of Stress : Only a little  Social Connections: Moderately Isolated (01/23/2024)   Social Connection and Isolation Panel    Frequency of Communication with Friends and Family: More than three times a week    Frequency of Social Gatherings with Friends and Family: Once a week    Attends Religious Services: Never    Database administrator or Organizations: No    Attends Engineer, structural: Not on file    Marital Status: Married  Catering manager Violence: Not At Risk (12/18/2023)   Humiliation, Afraid, Rape, and Kick questionnaire    Fear of Current or Ex-Partner: No    Emotionally Abused: No    Physically Abused: No  Sexually Abused: No    FAMILY HISTORY: Family History  Problem Relation Age of Onset   Anemia Mother    Anemia Other    Breast cancer Neg Hx    Colon cancer Neg Hx     ALLERGIES:  is allergic to penicillins.  MEDICATIONS:  Current Outpatient Medications  Medication Sig Dispense Refill   amLODipine  (NORVASC ) 10 MG tablet TAKE 1 TABLET(10 MG) BY MOUTH DAILY 90 tablet 1   celecoxib  (CELEBREX ) 200 MG capsule TAKE 1 CAPSULE BY MOUTH TWICE DAILY 180 capsule 1   diclofenac  sodium (VOLTAREN ) 1 % GEL Apply 4 g topically 4 (four) times daily as needed. 100 g 5   fluticasone (FLONASE) 50 MCG/ACT nasal spray Place 2 sprays into both nostrils daily as needed.     lisinopril -hydrochlorothiazide  (ZESTORETIC ) 10-12.5 MG tablet TAKE 1 TABLET BY MOUTH DAILY 90 tablet 0   Melatonin 10 MG TABS Take 10 mg by mouth at bedtime as needed.     metFORMIN  (GLUCOPHAGE ) 500 MG tablet TAKE 1 TABLET(500 MG) BY MOUTH THREE TIMES DAILY WITH MEALS 270 tablet 4   omeprazole  (PRILOSEC) 20 MG capsule TAKE 1 CAPSULE(20 MG) BY MOUTH DAILY 90 capsule 0   oxyCODONE -acetaminophen  (PERCOCET) 7.5-325 MG tablet Take 2 tablets by mouth every 6 (six) hours as needed for severe pain (pain score  7-10). 120 tablet 0   phenazopyridine  (PYRIDIUM ) 200 MG tablet Take 1 tablet (200 mg total) by mouth 3 (three) times daily as needed for pain. 10 tablet 0   sertraline  (ZOLOFT ) 50 MG tablet TAKE 1 TABLET(50 MG) BY MOUTH DAILY 90 tablet 1   tirzepatide  (MOUNJARO ) 10 MG/0.5ML Pen Inject 10 mg into the skin once a week. 2 mL 1   Vibegron  (GEMTESA ) 75 MG TABS Take 1 tablet (75 mg total) by mouth daily.     No current facility-administered medications for this visit.    Review of Systems  Constitutional:  Positive for fatigue. Negative for chills, fever and unexpected weight change.  HENT:   Negative for hearing loss and voice change.   Eyes:  Negative for eye problems.  Respiratory:  Negative for chest tightness and cough.   Cardiovascular:  Negative for chest pain.  Gastrointestinal:  Negative for abdominal distention, abdominal pain and blood in stool.  Endocrine: Negative for hot flashes.  Genitourinary:  Negative for difficulty urinating and frequency.   Musculoskeletal:  Negative for arthralgias.  Skin:  Negative for itching and rash.  Neurological:  Negative for extremity weakness.  Hematological:  Negative for adenopathy.  Psychiatric/Behavioral:  Negative for confusion.    PHYSICAL EXAMINATION:  Vitals:   04/10/24 1335  BP: 133/80  Pulse: 71  Resp: 18  Temp: (!) 97 F (36.1 C)  SpO2: 100%   Filed Weights   04/10/24 1335  Weight: (!) 320 lb 6.4 oz (145.3 kg)    Physical Exam Constitutional:      General: She is not in acute distress.    Appearance: She is obese.  HENT:     Head: Normocephalic and atraumatic.   Eyes:     General: No scleral icterus.   Cardiovascular:     Rate and Rhythm: Normal rate and regular rhythm.  Pulmonary:     Effort: Pulmonary effort is normal. No respiratory distress.     Breath sounds: Normal breath sounds. No wheezing.  Abdominal:     General: Bowel sounds are normal. There is no distension.     Palpations: Abdomen is soft.    Musculoskeletal:  General: No deformity. Normal range of motion.     Cervical back: Normal range of motion and neck supple.   Skin:    General: Skin is warm and dry.     Findings: No erythema or rash.   Neurological:     Mental Status: She is alert and oriented to person, place, and time. Mental status is at baseline.   Psychiatric:        Mood and Affect: Mood normal.     LABORATORY DATA:  I have reviewed the data as listed    Latest Ref Rng & Units 04/06/2024    4:06 PM 03/06/2024    1:15 PM 02/02/2024    3:57 PM  CBC  WBC 4.0 - 10.5 K/uL 8.7  8.2  9.6   Hemoglobin 12.0 - 15.0 g/dL 89.1  8.8  7.3   Hematocrit 36.0 - 46.0 % 35.2  28.8  24.9   Platelets 150 - 400 K/uL 361  361  410       Latest Ref Rng & Units 01/23/2024    1:52 PM 12/22/2023    6:20 AM 12/21/2023    5:41 AM  CMP  Glucose 70 - 99 mg/dL 895  888  891   BUN 6 - 24 mg/dL 15  13  23    Creatinine 0.57 - 1.00 mg/dL 8.69  9.06  8.87   Sodium 134 - 144 mmol/L 139  139  137   Potassium 3.5 - 5.2 mmol/L 4.5  3.7  3.7   Chloride 96 - 106 mmol/L 98  105  104   CO2 20 - 29 mmol/L 24  25  23    Calcium 8.7 - 10.2 mg/dL 9.1  8.3  8.4   Total Protein 6.0 - 8.5 g/dL 7.9     Total Bilirubin 0.0 - 1.2 mg/dL 0.2     Alkaline Phos 44 - 121 IU/L 125     AST 0 - 40 IU/L 10     ALT 0 - 32 IU/L 5         RADIOGRAPHIC STUDIES: I have personally reviewed the radiological images as listed and agreed with the findings in the report. No results found.

## 2024-04-10 NOTE — Assessment & Plan Note (Signed)
 Encourage oral hydration and avoid nephrotoxins.  No M protein of protein electrophoresis

## 2024-04-10 NOTE — Progress Notes (Signed)
 Patient declined to wait the 30 minutes for post iron infusion observation today. Tolerated infusion well. VSS.

## 2024-04-10 NOTE — Assessment & Plan Note (Addendum)
 Labs are reviewed and discussed with patient. Lab Results  Component Value Date   HGB 10.8 (L) 04/06/2024   TIBC 307 04/06/2024   IRONPCTSAT 10 (L) 04/06/2024   FERRITIN 244 04/06/2024    Hemoglobin and iron  panel have both improved.  Persistently decreased iron  saturation.   Recommend additional IV Venofer  weekly x 2 to further increase iron  stores. No need for Retacrit.

## 2024-04-10 NOTE — Assessment & Plan Note (Signed)
 large hemorrhagic ovarian cyst or endometrioma  Recommend patient to continue follow-up with gynecology oncology Dr. Randalyn Bushman

## 2024-04-12 ENCOUNTER — Ambulatory Visit: Admitting: Urology

## 2024-04-12 VITALS — BP 116/61 | HR 80 | Ht 66.0 in | Wt 315.0 lb

## 2024-04-12 DIAGNOSIS — R399 Unspecified symptoms and signs involving the genitourinary system: Secondary | ICD-10-CM | POA: Diagnosis not present

## 2024-04-12 DIAGNOSIS — R102 Pelvic and perineal pain: Secondary | ICD-10-CM

## 2024-04-12 LAB — URINALYSIS, COMPLETE
Bilirubin, UA: NEGATIVE
Glucose, UA: NEGATIVE
Ketones, UA: NEGATIVE
Leukocytes,UA: NEGATIVE
Nitrite, UA: NEGATIVE
Protein,UA: NEGATIVE
RBC, UA: NEGATIVE
Specific Gravity, UA: 1.02 (ref 1.005–1.030)
Urobilinogen, Ur: 0.2 mg/dL (ref 0.2–1.0)
pH, UA: 6 (ref 5.0–7.5)

## 2024-04-12 LAB — MICROSCOPIC EXAMINATION

## 2024-04-12 MED ORDER — SOLIFENACIN SUCCINATE 10 MG PO TABS: 10.0000 mg | ORAL_TABLET | Freq: Every day | ORAL | 0 refills | Status: DC

## 2024-04-12 NOTE — Progress Notes (Signed)
   04/12/24  CC:  Chief Complaint  Patient presents with   Cysto    HPI: Refer to my previous note 03/14/2024.  No changes in her voiding symptoms or pain on Gemtesa .  Her pain is not constant and she typically notes when leaning over/bending forward.  UA dipstick/microscopy negative  Blood pressure 116/61, pulse 80, height 5' 6 (1.676 m), weight (!) 315 lb (142.9 kg). Normal external genitalia with patent urethral meatus  In/out catheterization was performed prior to cystoscopy with a PVR of 9 mL  Cystoscopy Procedure Note  Patient identification was confirmed, informed consent was obtained, and patient was prepped using Betadine solution.  Lidocaine jelly was administered per urethral meatus.    Procedure: - Flexible cystoscope introduced, without any difficulty.   - Thorough search of the bladder revealed:    normal urethral meatus    normal urothelium    no stones    no ulcers     no tumors    no urethral polyps    no trabeculation - Mass effect superior aspect of bladder - Ureteral orifices were normal in position and appearance.  Post-Procedure: - Patient tolerated the procedure well  Assessment/ Plan: Pelvic pain/LUTS We discussed possibility of the pelvic mass accounting for her pain/voiding symptoms She has a follow-up next week with GYN oncology Trial solifenacin 10 mg daily.  We discussed the most common side effects of dry mouth and constipation    Jessica JAYSON Barba, MD

## 2024-04-13 ENCOUNTER — Encounter: Payer: Self-pay | Admitting: Urology

## 2024-04-15 ENCOUNTER — Other Ambulatory Visit: Payer: Self-pay | Admitting: Family Medicine

## 2024-04-15 DIAGNOSIS — E119 Type 2 diabetes mellitus without complications: Secondary | ICD-10-CM

## 2024-04-17 ENCOUNTER — Other Ambulatory Visit: Payer: Self-pay

## 2024-04-17 DIAGNOSIS — R1900 Intra-abdominal and pelvic swelling, mass and lump, unspecified site: Secondary | ICD-10-CM

## 2024-04-23 ENCOUNTER — Other Ambulatory Visit: Payer: Self-pay | Admitting: Family Medicine

## 2024-04-23 DIAGNOSIS — R1011 Right upper quadrant pain: Secondary | ICD-10-CM

## 2024-04-23 NOTE — Telephone Encounter (Unsigned)
 Copied from CRM [id number]. Topic: Clinical - Medication Refill >> Apr 23, 2024  9:34 AM Thersia C wrote: Medication: oxyCODONE -acetaminophen  (PERCOCET) 7.5-325 MG tablet  Has the patient contacted their pharmacy? Yes (Agent: If no, request that the patient contact the pharmacy for the refill. If patient does not wish to contact the pharmacy document the reason why and proceed with request.) (Agent: If yes, when and what did the pharmacy advise?)  This is the patient's preferred pharmacy:  [facility]ORE #[id number] [place], [place] - [address] AT [facility] OF [address] [place] [address] Phone: [phone] Fax: [phone]  Is this the correct pharmacy for this prescription? Yes If no, delete pharmacy and type the correct one.   Has the prescription been filled recently? No  Is the patient out of the medication? Yes  Has the patient been seen for an appointment in the last year OR does the patient have an upcoming appointment? Yes  Can we respond through MyChart? Yes  Agent: Please be advised that Rx refills may take up to 3 business days. We ask that you follow-up with your pharmacy.

## 2024-04-24 ENCOUNTER — Inpatient Hospital Stay

## 2024-04-24 ENCOUNTER — Inpatient Hospital Stay: Attending: Oncology

## 2024-04-24 VITALS — BP 105/59 | HR 61 | Temp 97.2°F | Resp 18

## 2024-04-24 DIAGNOSIS — D508 Other iron deficiency anemias: Secondary | ICD-10-CM

## 2024-04-24 DIAGNOSIS — D509 Iron deficiency anemia, unspecified: Secondary | ICD-10-CM | POA: Insufficient documentation

## 2024-04-24 DIAGNOSIS — R1900 Intra-abdominal and pelvic swelling, mass and lump, unspecified site: Secondary | ICD-10-CM

## 2024-04-24 MED ORDER — SODIUM CHLORIDE 0.9% FLUSH
10.0000 mL | Freq: Once | INTRAVENOUS | Status: AC | PRN
  Administered 2024-04-24: 10 mL
  Filled 2024-04-24: qty 10

## 2024-04-24 MED ORDER — IRON SUCROSE 20 MG/ML IV SOLN
200.0000 mg | Freq: Once | INTRAVENOUS | Status: AC
  Administered 2024-04-24: 200 mg via INTRAVENOUS

## 2024-04-24 NOTE — Progress Notes (Signed)
 Patient declined to wait the 30 minutes for post iron infusion observation today. Tolerated infusion well. VSS.

## 2024-04-25 ENCOUNTER — Ambulatory Visit: Payer: Self-pay | Admitting: Obstetrics and Gynecology

## 2024-04-25 LAB — CA 125: Cancer Antigen (CA) 125: 12 U/mL (ref 0.0–38.1)

## 2024-04-25 MED ORDER — OXYCODONE-ACETAMINOPHEN 7.5-325 MG PO TABS: 2.0000 | ORAL_TABLET | Freq: Four times a day (QID) | ORAL | 0 refills | Status: DC | PRN

## 2024-04-25 NOTE — Telephone Encounter (Signed)
 Requested medication (s) are due for refill today: Yes  Requested medication (s) are on the active medication list: Yes  Last refill:    Future visit scheduled: No  Notes to clinic:  Not delegated.    Requested Prescriptions  Pending Prescriptions Disp Refills   oxyCODONE -acetaminophen  (PERCOCET) 7.5-325 MG tablet 120 tablet 0    Sig: Take 2 tablets by mouth every 6 (six) hours as needed for severe pain (pain score 7-10).     Not Delegated - Analgesics:  Opioid Agonist Combinations Failed - 04/25/2024 10:22 AM      Failed - This refill cannot be delegated      Failed - Urine Drug Screen completed in last 360 days      Failed - Valid encounter within last 3 months    Recent Outpatient Visits           3 months ago Incomplete emptying of bladder   [place] [facility] Gasper Nancyann BRAVO, MD   4 months ago Hx of urinary infection   [place] [facility] Gasper Nancyann BRAVO, MD   4 months ago Vaginal bleeding   [facility] Gasper Nancyann BRAVO, MD

## 2024-04-26 LAB — HUMAN EPIDIDYMIS PROT 4,SERIAL: HE4: 104 pmol/L — ABNORMAL HIGH (ref 0.0–63.6)

## 2024-04-26 NOTE — Progress Notes (Signed)
 I called and Jessica Fields with her tumor markers. She is symptomatic with the mass now and would like to proceed with surgery. Plan for robotic oophorectomy, bilateral salpinectomy, and possible hysterectomy. Plan for ureteral stents given h/o endometriosis.  She agrees with this plan and I asked our team to contact her regarding OR dates.  ANGELES [name], MD

## 2024-05-04 ENCOUNTER — Encounter: Payer: Self-pay | Admitting: Oncology

## 2024-05-15 ENCOUNTER — Other Ambulatory Visit: Payer: Self-pay | Admitting: Family Medicine

## 2024-05-15 ENCOUNTER — Other Ambulatory Visit: Payer: Self-pay | Admitting: Urology

## 2024-05-15 DIAGNOSIS — I10 Essential (primary) hypertension: Secondary | ICD-10-CM

## 2024-05-15 DIAGNOSIS — R1011 Right upper quadrant pain: Secondary | ICD-10-CM

## 2024-05-15 NOTE — Telephone Encounter (Unsigned)
 Copied from CRM [id number]. Topic: Clinical - Medication Refill >> May 15, 2024  4:31 PM Tiffini S wrote: Medication: oxyCODONE -acetaminophen  (PERCOCET) 7.5-325 MG tablet  Has the patient contacted their pharmacy? No (Agent: If no, request that the patient contact the pharmacy for the refill. If patient does not wish to contact the pharmacy document the reason why and proceed with request.) (Agent: If yes, when and what did the pharmacy advise?)  This is the patient's preferred pharmacy:  [facility] DR[facility] STORE #[id number] [place], [place] - [address] AT [facility] OF [address] [place] [address] Phone: [phone] Fax: [phone]  Is this the correct pharmacy for this prescription? Yes If no, delete pharmacy and type the correct one.   Has the prescription been filled recently? Yes  Is the patient out of the medication? Yes  Has the patient been seen for an appointment in the last year OR does the patient have an upcoming appointment? Yes  Can we respond through MyChart? Yes  Agent: Please be advised that Rx refills may take up to 3 business days. We ask that you follow-up with your pharmacy.

## 2024-05-16 ENCOUNTER — Encounter: Payer: Self-pay | Admitting: Obstetrics and Gynecology

## 2024-05-16 NOTE — Progress Notes (Unsigned)
 04/26/2024 [facility] HE4 = [id number] Jessica Fields [name], MD

## 2024-05-16 NOTE — Telephone Encounter (Signed)
 Requested medication (s) are due for refill today - yes  Requested medication (s) are on the active medication list -yes  Future visit scheduled -no  Last refill: 04/25/24 #120  Notes to clinic: non delegated Rx  Requested Prescriptions  Pending Prescriptions Disp Refills   oxyCODONE -acetaminophen  (PERCOCET) 7.5-325 MG tablet 120 tablet 0    Sig: Take 2 tablets by mouth every 6 (six) hours as needed for severe pain (pain score 7-10).     Not Delegated - Analgesics:  Opioid Agonist Combinations Failed - 05/16/2024  2:36 PM      Failed - This refill cannot be delegated      Failed - Urine Drug Screen completed in last 360 days      Failed - Valid encounter within last 3 months    Recent Outpatient Visits           3 months ago Incomplete emptying of bladder   [facility] Gasper Nancyann BRAVO, MD   4 months ago Hx of urinary infection   [place] [facility] Gasper Nancyann BRAVO, MD   5 months ago Vaginal bleeding   [place] [facility] Gasper Nancyann BRAVO, MD                 Requested Prescriptions  Pending Prescriptions Disp Refills   oxyCODONE -acetaminophen  (PERCOCET) 7.5-325 MG tablet 120 tablet 0    Sig: Take 2 tablets by mouth every 6 (six) hours as needed for severe pain (pain score 7-10).     Not Delegated - Analgesics:  Opioid Agonist Combinations Failed - 05/16/2024  2:36 PM      Failed - This refill cannot be delegated      Failed - Urine Drug Screen completed in last 360 days      Failed - Valid encounter within last 3 months    Recent Outpatient Visits           3 months ago Incomplete emptying of bladder   [place] [facility] Gasper Nancyann BRAVO, MD   4 months ago Hx of urinary infection   [place] [facility] Gasper Nancyann BRAVO, MD   5 months ago Vaginal bleeding   [facility] Gasper Nancyann BRAVO, MD

## 2024-05-17 MED ORDER — OXYCODONE-ACETAMINOPHEN 7.5-325 MG PO TABS: 2.0000 | ORAL_TABLET | Freq: Four times a day (QID) | ORAL | 0 refills | Status: DC | PRN

## 2024-06-01 ENCOUNTER — Other Ambulatory Visit: Payer: Self-pay | Admitting: Family Medicine

## 2024-06-01 DIAGNOSIS — R1011 Right upper quadrant pain: Secondary | ICD-10-CM

## 2024-06-01 NOTE — Telephone Encounter (Unsigned)
 Copied from CRM [id number]. Topic: Clinical - Medication Refill >> Jun 01, 2024  9:08 AM Mia F wrote: Medication: oxyCODONE -acetaminophen  (PERCOCET) 7.5-325 MG tablet   Has the patient contacted their pharmacy? No (Agent: If no, request that the patient contact the pharmacy for the refill. If patient does not wish to contact the pharmacy document the reason why and proceed with request.) (Agent: If yes, when and what did the pharmacy advise?)  This is the patient's preferred pharmacy:  [facility] STORE #[id number] [place], [place] - [address] AT [facility] OF [address] [place] [address] Phone: [phone] Fax: [phone]  Is this the correct pharmacy for this prescription? Yes If no, delete pharmacy and type the correct one.   Has the prescription been filled recently? Yes  Is the patient out of the medication? Yes  Has the patient been seen for an appointment in the last year OR does the patient have an upcoming appointment? No  Can we respond through MyChart? Yes  Agent: Please be advised that Rx refills may take up to 3 business days. We ask that you follow-up with your pharmacy.

## 2024-06-04 ENCOUNTER — Other Ambulatory Visit: Payer: Self-pay | Admitting: Family Medicine

## 2024-06-04 DIAGNOSIS — E1169 Type 2 diabetes mellitus with other specified complication: Secondary | ICD-10-CM

## 2024-06-04 NOTE — Telephone Encounter (Signed)
 Requested medications are due for refill today.  no  Requested medications are on the active medications list.  yes  Last refill. 05/17/2024 #120 0 rf  Future visit scheduled.   no  Notes to clinic.  Refill not delegated.    Requested Prescriptions  Pending Prescriptions Disp Refills   oxyCODONE -acetaminophen  (PERCOCET) 7.5-325 MG tablet 120 tablet 0    Sig: Take 2 tablets by mouth every 6 (six) hours as needed for severe pain (pain score 7-10).     Not Delegated - Analgesics:  Opioid Agonist Combinations Failed - 06/04/2024  5:23 PM      Failed - This refill cannot be delegated      Failed - Urine Drug Screen completed in last 360 days      Failed - Valid encounter within last 3 months    Recent Outpatient Visits           4 months ago Incomplete emptying of bladder   [place] [facility] Gasper Nancyann BRAVO, MD   5 months ago Hx of urinary infection   [place] [facility] Gasper Nancyann BRAVO, MD   5 months ago Vaginal bleeding   [facility] Gasper Nancyann BRAVO, MD

## 2024-06-04 NOTE — Telephone Encounter (Signed)
 Requested medications are due for refill today.  Yes   Requested medications are on the active medications list.  yes  Last refill. 05/17/2024 #120 0 rf  Future visit scheduled.   no  Notes to clinic.  Refill not delegated.    Requested Prescriptions  Pending Prescriptions Disp Refills   oxyCODONE -acetaminophen  (PERCOCET) 7.5-325 MG tablet 120 tablet 0    Sig: Take 2 tablets by mouth every 6 (six) hours as needed for severe pain (pain score 7-10).     Not Delegated - Analgesics:  Opioid Agonist Combinations Failed - 06/04/2024  5:25 PM      Failed - This refill cannot be delegated      Failed - Urine Drug Screen completed in last 360 days      Failed - Valid encounter within last 3 months    Recent Outpatient Visits           4 months ago Incomplete emptying of bladder   [place] [facility] Gasper Nancyann BRAVO, MD   5 months ago Hx of urinary infection   [place] [facility] Gasper Nancyann BRAVO, MD   5 months ago Vaginal bleeding   [facility] Gasper Nancyann BRAVO, MD

## 2024-06-04 NOTE — Telephone Encounter (Signed)
 Refill is due

## 2024-06-05 MED ORDER — OXYCODONE-ACETAMINOPHEN 7.5-325 MG PO TABS: 2.0000 | ORAL_TABLET | Freq: Four times a day (QID) | ORAL | 0 refills | Status: DC | PRN

## 2024-06-20 ENCOUNTER — Other Ambulatory Visit: Payer: Self-pay | Admitting: Urology

## 2024-06-21 HISTORY — PX: ROBOTIC ASSISTED TOTAL HYSTERECTOMY: SHX6085

## 2024-06-26 ENCOUNTER — Other Ambulatory Visit: Payer: Self-pay | Admitting: Family Medicine

## 2024-06-26 DIAGNOSIS — R1011 Right upper quadrant pain: Secondary | ICD-10-CM

## 2024-06-26 NOTE — Telephone Encounter (Unsigned)
 Copied from CRM [id number]. Topic: Clinical - Medication Refill >> Jun 26, 2024 12:22 PM Lauren C wrote: Medication: oxyCODONE -acetaminophen  (PERCOCET) 7.5-325 MG tablet   Has the patient contacted their pharmacy? No (Agent: If no, request that the patient contact the pharmacy for the refill. If patient does not wish to contact the pharmacy document the reason why and proceed with request.) (Agent: If yes, when and what did the pharmacy advise?)  This is the patient's preferred pharmacy:  [facility] #[id number] [place], [place] - [address] AT [facility] OF [address] [place] [address] Phone: [phone] Fax: [phone]  Is this the correct pharmacy for this prescription? Yes If no, delete pharmacy and type the correct one.   Has the prescription been filled recently? Yes  Is the patient out of the medication? Yes  Has the patient been seen for an appointment in the last year OR does the patient have an upcoming appointment? Yes  Can we respond through MyChart? Yes  Agent: Please be advised that Rx refills may take up to 3 business days. We ask that you follow-up with your pharmacy.

## 2024-06-27 NOTE — Telephone Encounter (Signed)
 Requested medication (s) are due for refill today:   Provider to review  Requested medication (s) are on the active medication list:   Yes  Future visit scheduled:   No.     Last ordered: 06/05/2024 #120, 0 refills  Non delegated refill    Requested Prescriptions  Pending Prescriptions Disp Refills   oxyCODONE -acetaminophen  (PERCOCET) 7.5-325 MG tablet 120 tablet 0    Sig: Take 2 tablets by mouth every 6 (six) hours as needed for severe pain (pain score 7-10).     Not Delegated - Analgesics:  Opioid Agonist Combinations Failed - 06/27/2024  3:35 PM      Failed - This refill cannot be delegated      Failed - Urine Drug Screen completed in last 360 days      Failed - Valid encounter within last 3 months    Recent Outpatient Visits           5 months ago Incomplete emptying of bladder   [place] [facility] Gasper Nancyann BRAVO, MD   6 months ago Hx of urinary infection   [place] [facility] Gasper Nancyann BRAVO, MD   6 months ago Vaginal bleeding   [place] [facility] Gasper Nancyann BRAVO, MD

## 2024-06-28 MED ORDER — OXYCODONE-ACETAMINOPHEN 7.5-325 MG PO TABS: 2.0000 | ORAL_TABLET | Freq: Four times a day (QID) | ORAL | 0 refills | Status: DC | PRN

## 2024-07-16 ENCOUNTER — Other Ambulatory Visit: Payer: Self-pay | Admitting: Family Medicine

## 2024-07-16 DIAGNOSIS — F439 Reaction to severe stress, unspecified: Secondary | ICD-10-CM

## 2024-07-16 DIAGNOSIS — I10 Essential (primary) hypertension: Secondary | ICD-10-CM

## 2024-07-16 DIAGNOSIS — R1011 Right upper quadrant pain: Secondary | ICD-10-CM

## 2024-07-16 NOTE — Telephone Encounter (Unsigned)
 Copied from CRM [id number]. Topic: Clinical - Medication Refill >> Jul 16, 2024 11:31 AM Myrick T wrote: Medication: oxyCODONE -acetaminophen  (PERCOCET) 7.5-325 MG tablet  Has the patient contacted their pharmacy? No  This is the patient's preferred pharmacy:  [facility] #[id number] [place], [place] - [address] AT [facility] [address] [place] [address] Phone: [phone] Fax: [phone]  Is this the correct pharmacy for this prescription? Yes  Has the prescription been filled recently? Yes  Is the patient out of the medication? Yes  Has the patient been seen for an appointment in the last year OR does the patient have an upcoming appointment? Yes  Can we respond through MyChart? Yes  Agent: Please be advised that Rx refills may take up to 3 business days. We ask that you follow-up with your pharmacy.

## 2024-07-18 NOTE — Telephone Encounter (Signed)
 Requested medication (s) are due for refill today: yes  Requested medication (s) are on the active medication list: yes  Last refill:  06/28/24  Future visit scheduled: no  Notes to clinic: Unable to refill per protocol, cannot delegate.       Requested Prescriptions  Pending Prescriptions Disp Refills   oxyCODONE -acetaminophen  (PERCOCET) 7.5-325 MG tablet 120 tablet 0    Sig: Take 2 tablets by mouth every 6 (six) hours as needed for severe pain (pain score 7-10).     Not Delegated - Analgesics:  Opioid Agonist Combinations Failed - 07/18/2024 11:15 AM      Failed - This refill cannot be delegated      Failed - Urine Drug Screen completed in last 360 days      Failed - Valid encounter within last 3 months    Recent Outpatient Visits           5 months ago Incomplete emptying of bladder   [place] [facility] Gasper Nancyann BRAVO, MD   6 months ago Hx of urinary infection   [place] [facility] Gasper Nancyann BRAVO, MD   7 months ago Vaginal bleeding   [facility] Gasper Nancyann BRAVO, MD

## 2024-07-19 ENCOUNTER — Other Ambulatory Visit: Payer: Self-pay | Admitting: Family Medicine

## 2024-07-19 DIAGNOSIS — E1169 Type 2 diabetes mellitus with other specified complication: Secondary | ICD-10-CM

## 2024-07-19 MED ORDER — OXYCODONE-ACETAMINOPHEN 7.5-325 MG PO TABS: 2.0000 | ORAL_TABLET | Freq: Four times a day (QID) | ORAL | 0 refills | Status: DC | PRN

## 2024-07-28 ENCOUNTER — Other Ambulatory Visit: Payer: Self-pay | Admitting: Urology

## 2024-08-03 ENCOUNTER — Other Ambulatory Visit: Payer: Self-pay | Admitting: Family Medicine

## 2024-08-03 DIAGNOSIS — R1011 Right upper quadrant pain: Secondary | ICD-10-CM

## 2024-08-03 NOTE — Telephone Encounter (Unsigned)
 Copied from CRM [id number]. Topic: Clinical - Medication Refill >> Aug 03, 2024  2:24 PM Fonda T wrote: Medication: oxyCODONE -acetaminophen  (PERCOCET) 7.5-325 MG tablet  Has the patient contacted their pharmacy? Yes, advised to contact office   This is the patient's preferred pharmacy:  [facility]ORE #[id number] [place], [place] - [address] AT [facility] [address] [place] [address] Phone: [phone] Fax: [phone]   Is this the correct pharmacy for this prescription? Yes If no, delete pharmacy and type the correct one.   Has the prescription been filled recently? Yes  Is the patient out of the medication? Yes  Has the patient been seen for an appointment in the last year OR does the patient have an upcoming appointment? Yes  Can we respond through MyChart? Yes  Agent: Please be advised that Rx refills may take up to 3 business days. We ask that you follow-up with your pharmacy.

## 2024-08-06 NOTE — Telephone Encounter (Signed)
 Requested medication (s) are due for refill today -no  Requested medication (s) are on the active medication list -yes  Future visit scheduled -no  Last refill: 07/19/24 #120  Notes to clinic: non delegated Rx  Requested Prescriptions  Pending Prescriptions Disp Refills   oxyCODONE -acetaminophen  (PERCOCET) 7.5-325 MG tablet 120 tablet 0    Sig: Take 2 tablets by mouth every 6 (six) hours as needed for severe pain (pain score 7-10).     Not Delegated - Analgesics:  Opioid Agonist Combinations Failed - 08/06/2024  3:38 PM      Failed - This refill cannot be delegated      Failed - Urine Drug Screen completed in last 360 days      Failed - Valid encounter within last 3 months    Recent Outpatient Visits           6 months ago Incomplete emptying of bladder   [place] [facility] Gasper Nancyann BRAVO, MD   7 months ago Hx of urinary infection   [facility] Gasper Nancyann BRAVO, MD   7 months ago Vaginal bleeding   [place] [facility] Gasper Nancyann BRAVO, MD                 Requested Prescriptions  Pending Prescriptions Disp Refills   oxyCODONE -acetaminophen  (PERCOCET) 7.5-325 MG tablet 120 tablet 0    Sig: Take 2 tablets by mouth every 6 (six) hours as needed for severe pain (pain score 7-10).     Not Delegated - Analgesics:  Opioid Agonist Combinations Failed - 08/06/2024  3:38 PM      Failed - This refill cannot be delegated      Failed - Urine Drug Screen completed in last 360 days      Failed - Valid encounter within last 3 months    Recent Outpatient Visits           6 months ago Incomplete emptying of bladder   [place] [facility] Gasper Nancyann BRAVO, MD   7 months ago Hx of urinary infection   [facility] Gasper Nancyann BRAVO, MD   7 months ago Vaginal bleeding   [facility] Gasper Nancyann BRAVO, MD

## 2024-08-07 MED ORDER — OXYCODONE-ACETAMINOPHEN 7.5-325 MG PO TABS
2.0000 | ORAL_TABLET | Freq: Four times a day (QID) | ORAL | 0 refills | Status: DC | PRN
Start: 2024-08-07 — End: 2024-08-17

## 2024-08-10 ENCOUNTER — Inpatient Hospital Stay: Attending: Oncology

## 2024-08-10 DIAGNOSIS — N183 Chronic kidney disease, stage 3 unspecified: Secondary | ICD-10-CM | POA: Insufficient documentation

## 2024-08-10 DIAGNOSIS — D508 Other iron deficiency anemias: Secondary | ICD-10-CM

## 2024-08-10 DIAGNOSIS — D509 Iron deficiency anemia, unspecified: Secondary | ICD-10-CM | POA: Insufficient documentation

## 2024-08-10 LAB — CBC WITH DIFFERENTIAL (CANCER CENTER ONLY)
Abs Immature Granulocytes: 0.05 K/uL (ref 0.00–0.07)
Basophils Absolute: 0.1 K/uL (ref 0.0–0.1)
Basophils Relative: 1 %
Eosinophils Absolute: 0.3 K/uL (ref 0.0–0.5)
Eosinophils Relative: 4 %
HCT: 32.8 % — ABNORMAL LOW (ref 36.0–46.0)
Hemoglobin: 10.8 g/dL — ABNORMAL LOW (ref 12.0–15.0)
Immature Granulocytes: 1 %
Lymphocytes Relative: 22 %
Lymphs Abs: 1.6 K/uL (ref 0.7–4.0)
MCH: 29.4 pg (ref 26.0–34.0)
MCHC: 32.9 g/dL (ref 30.0–36.0)
MCV: 89.4 fL (ref 80.0–100.0)
Monocytes Absolute: 0.5 K/uL (ref 0.1–1.0)
Monocytes Relative: 7 %
Neutro Abs: 4.8 K/uL (ref 1.7–7.7)
Neutrophils Relative %: 65 %
Platelet Count: 281 K/uL (ref 150–400)
RBC: 3.67 MIL/uL — ABNORMAL LOW (ref 3.87–5.11)
RDW: 13 % (ref 11.5–15.5)
WBC Count: 7.3 K/uL (ref 4.0–10.5)
nRBC: 0 % (ref 0.0–0.2)

## 2024-08-10 LAB — RETIC PANEL
Immature Retic Fract: 3.4 % (ref 2.3–15.9)
RBC.: 3.69 MIL/uL — ABNORMAL LOW (ref 3.87–5.11)
Retic Count, Absolute: 52.8 K/uL (ref 19.0–186.0)
Retic Ct Pct: 1.4 % (ref 0.4–3.1)
Reticulocyte Hemoglobin: 32.9 pg (ref >27.9)

## 2024-08-10 LAB — IRON AND TIBC
Iron: 35 ug/dL (ref 28–170)
Saturation Ratios: 11 % (ref 10.4–31.8)
TIBC: 316 ug/dL (ref 250–450)
UIBC: 281 ug/dL

## 2024-08-10 LAB — FERRITIN: Ferritin: 144 ng/mL (ref 11–307)

## 2024-08-14 ENCOUNTER — Encounter: Payer: Self-pay | Admitting: Oncology

## 2024-08-14 ENCOUNTER — Inpatient Hospital Stay: Admitting: Oncology

## 2024-08-14 ENCOUNTER — Ambulatory Visit

## 2024-08-14 ENCOUNTER — Ambulatory Visit: Admitting: Oncology

## 2024-08-14 ENCOUNTER — Inpatient Hospital Stay

## 2024-08-14 VITALS — BP 143/66 | HR 71 | Temp 96.4°F | Resp 17

## 2024-08-14 VITALS — BP 130/77 | HR 77 | Temp 96.0°F | Resp 16 | Wt 310.0 lb

## 2024-08-14 DIAGNOSIS — D508 Other iron deficiency anemias: Secondary | ICD-10-CM | POA: Diagnosis not present

## 2024-08-14 DIAGNOSIS — D509 Iron deficiency anemia, unspecified: Secondary | ICD-10-CM | POA: Diagnosis not present

## 2024-08-14 DIAGNOSIS — N1831 Chronic kidney disease, stage 3a: Secondary | ICD-10-CM | POA: Diagnosis not present

## 2024-08-14 MED ORDER — SODIUM CHLORIDE 0.9% FLUSH
10.0000 mL | Freq: Once | INTRAVENOUS | Status: AC | PRN
  Administered 2024-08-14: 10 mL
  Filled 2024-08-14: qty 10

## 2024-08-14 MED ORDER — IRON SUCROSE 20 MG/ML IV SOLN
200.0000 mg | Freq: Once | INTRAVENOUS | Status: AC
  Administered 2024-08-14: 200 mg via INTRAVENOUS
  Filled 2024-08-14: qty 10

## 2024-08-14 NOTE — Progress Notes (Signed)
 Hematology/Oncology Progress note [phone] [phone]           REFERRING PROVIDER: Gasper Nancyann BRAVO, MD   CHIEF COMPLAINTS/REASON FOR VISIT:  iron  deficiency anemia    ASSESSMENT & PLAN:   Iron  deficiency anemia Labs are reviewed and discussed with patient. Lab Results  Component Value Date   HGB 10.8 (L) 08/10/2024   TIBC 316 08/10/2024   IRONPCTSAT 11 08/10/2024   FERRITIN 144 08/10/2024    Hemoglobin and iron  panel have both improved.  Persistently decreased iron  saturation.   Recommend additional IV Venofer  weekly x 1 to further increase iron  stores. No need for Retacrit.  CKD (chronic kidney disease) stage 3, GFR 30-59 ml/min (HCC) Encourage oral hydration and avoid nephrotoxins.  No M protein of protein electrophoresis     Orders Placed This Encounter  Procedures   CBC with Differential (Cancer Center Only)    Standing Status:   Future    Expected Date:   02/12/2025    Expiration Date:   05/13/2025   Iron  and TIBC    Standing Status:   Future    Expected Date:   02/12/2025    Expiration Date:   05/13/2025   Ferritin    Standing Status:   Future    Expected Date:   02/12/2025    Expiration Date:   05/13/2025   CMP (Cancer Center only)    Standing Status:   Future    Expected Date:   02/12/2025    Expiration Date:   05/13/2025   Vitamin B12    Standing Status:   Future    Expected Date:   02/12/2025    Expiration Date:   05/13/2025   Folate    Standing Status:   Future    Expected Date:   02/12/2025    Expiration Date:   05/13/2025   Follow-up in 4 months All questions were answered. The patient knows to call the clinic with any problems, questions or concerns.  Zelphia Cap, MD, PhD [facility] Hematology Oncology 08/14/2024   HISTORY OF PRESENTING ILLNESS:   Jessica Fields is a  43 y.o.  female with PMH listed below was seen in consultation at the request of  Gasper Nancyann BRAVO, MD  for evaluation of iron  deficiency  anemia  12/09/2023, patient presented to ER for evaluation of abdominal pain. Ultrasound pelvis showed 9.3 x 9.1 x 8.7 cm mildly complex cystic lesion. MRI pelvis with and without contrast showed 1. At the posterior aspect of the uterine fundus and distorting the contour of the uterus, there is a large, septated lesion measuring 10.6 x 10.2 x 9.2 cm. 2. Normal left ovarian tissue is not clearly visualized. 3. Normal right ovary with a small, benign, functional cyst requiring no specific further follow-up or characterization. 4. IUD in the endometrial cavity. Non degenerated uterine fibroids which distort and almost completely efface endometrial cavity.  12/09/2023, patient was found to have a hemoglobin of 8.2, his level has decreased from her baseline of 10.9 in September 2024.  MCV was 76.2.  She also had a white count of 17.9.  Platelet count of 5 3 7. Iron  panel showed decreased iron  saturation to 7, TIBC 227.  No ferritin was checked.  Patient was seen by gynecologist for pelvic mass evaluation. Patient reports that her gynecologist arrange her to get 3 IV Feraheme treatments on 12/16/2023, 12/26/2023 and 01/04/2024.  12/18/2023 - 12/22/2023, patient was admitted due to sepsis secondary to urinary tract infection.  She initially required vasopressors.  Patient was treated with IV antibiotics and discharged on 12/22/2023.  Her hemoglobin was low during this hospitalization.  She reports having 3 units of PRBC transfusions  She was seen by Dr. Elby during her admission and followed up outpatient at [facility].  Dr. Elby recommended observation and repeat imaging in July 2025.   She has a history of an IUD and reports only light menstrual periods occasionally. No stomach pain, changes in bowel habits, or blood in the stool. Her medications include Celebrex  for arthritis, omeprazole  for acid reflux, metformin  for diabetes, and Zoloft  for mild depression and anxiety.   INTERVAL HISTORY Jessica Fields is a  43 y.o. female who has above history reviewed by me today presents for follow up visit for anemia. Patient has tolerated IV Venofer  treatments.  She did not notice any side effects.  Total hysterectomy and left salpingo-oophorectomy on 06/21/24   MEDICAL HISTORY:  Past Medical History:  Diagnosis Date   History of chicken pox    History of cholecystitis     SURGICAL HISTORY: Past Surgical History:  Procedure Laterality Date   Sleep Study  01/25/2005   Performed at [facility] by Dr. Dante. Interpretation: very severe Sleep Apnea with nocturnal desaturations. She was improved with nasal Bi-PAP, although was not titrated to optimal pressure. I suspect she has nasal sinus obstruction or congestion    SOCIAL HISTORY: Social History   Socioeconomic History   Marital status: Married    Spouse name: Not on file   Number of children: 0   Years of education: Not on file   Highest education level: Some college, no degree  Occupational History   Occupation: Transport Planner  Tobacco Use   Smoking status: Never   Smokeless tobacco: Never  Substance and Sexual Activity   Alcohol use: Yes    Alcohol/week: 0.0 standard drinks of alcohol    Comment: occasional use   Drug use: No   Sexual activity: Not on file  Other Topics Concern   Not on file  Social History Narrative   Not on file   Social Drivers of Health   Financial Resource Strain: Low Risk  (06/25/2024)   Received from [facility]   Overall Financial Resource Strain (CARDIA)    Difficulty of Paying Living Expenses: Not hard at all  Food Insecurity: No Food Insecurity (06/25/2024)   Received from [facility]   Hunger Vital Sign    Within the past 12 months, you worried that your food would run out before you got the money to buy more.: Never true    Within the past 12 months, the food you bought just didn't last and you didn't have money to get more.: Never true  Transportation Needs: No  Transportation Needs (06/25/2024)   [facility] - Transportation    In the past 12 months, has lack of transportation kept you from medical appointments or from getting medications?: No    Lack of Transportation (Non-Medical): No  Physical Activity: Insufficiently Active (01/23/2024)   Exercise Vital Sign    Days of Exercise per Week: 2 days    Minutes of Exercise per Session: 20 min  Stress: No Stress Concern Present (01/23/2024)   [employer] of Occupational Health - Occupational Stress Questionnaire    Feeling of Stress : Only a little  Social Connections: Moderately Isolated (01/23/2024)   Social Connection and Isolation Panel  Frequency of Communication with Friends and Family: More than three times a week    Frequency of Social Gatherings with Friends and Family: Once a week    Attends Religious Services: Never    Database Administrator or Organizations: No    Attends Engineer, Structural: Not on file    Marital Status: Married  Catering Manager Violence: Not At Risk (12/18/2023)   Humiliation, Afraid, Rape, and Kick questionnaire    Fear of Current or Ex-Partner: No    Emotionally Abused: No    Physically Abused: No    Sexually Abused: No    FAMILY HISTORY: Family History  Problem Relation Age of Onset   Anemia Mother    Anemia Other    Breast cancer Neg Hx    Colon cancer Neg Hx     ALLERGIES:  is allergic to penicillins.  MEDICATIONS:  Current Outpatient Medications  Medication Sig Dispense Refill   amLODipine  (NORVASC ) 10 MG tablet TAKE 1 TABLET(10 MG) BY MOUTH DAILY 90 tablet 1   celecoxib  (CELEBREX ) 200 MG capsule TAKE 1 CAPSULE BY MOUTH TWICE DAILY 180 capsule 1   diclofenac  sodium (VOLTAREN ) 1 % GEL Apply 4 g topically 4 (four) times daily as needed. 100 g 5   fluticasone (FLONASE) 50 MCG/ACT nasal spray Place 2 sprays into both nostrils daily as needed.     lisinopril -hydrochlorothiazide  (ZESTORETIC ) 10-12.5  MG tablet TAKE 1 TABLET BY MOUTH DAILY 90 tablet 3   Melatonin 10 MG TABS Take 10 mg by mouth at bedtime as needed.     metFORMIN  (GLUCOPHAGE ) 500 MG tablet TAKE 1 TABLET(500 MG) BY MOUTH THREE TIMES DAILY WITH MEALS 270 tablet 4   MOUNJARO  10 MG/0.5ML Pen ADMINISTER 10 MG UNDER THE SKIN 1 TIME A WEEK 2 mL 0   omeprazole  (PRILOSEC) 20 MG capsule TAKE 1 CAPSULE(20 MG) BY MOUTH DAILY 90 capsule 0   oxyCODONE -acetaminophen  (PERCOCET) 7.5-325 MG tablet Take 2 tablets by mouth every 6 (six) hours as needed for severe pain (pain score 7-10). 60 tablet 0   sertraline  (ZOLOFT ) 50 MG tablet TAKE 1 TABLET(50 MG) BY MOUTH DAILY 90 tablet 1   solifenacin  (VESICARE ) 10 MG tablet TAKE 1 TABLET(10 MG) BY MOUTH DAILY 30 tablet 3   estradiol (VIVELLE-DOT) 0.05 MG/24HR patch 1 patch 2 (two) times a week.     No current facility-administered medications for this visit.    Review of Systems  Constitutional:  Positive for fatigue. Negative for chills, fever and unexpected weight change.  HENT:   Negative for hearing loss and voice change.   Eyes:  Negative for eye problems.  Respiratory:  Negative for chest tightness and cough.   Cardiovascular:  Negative for chest pain.  Gastrointestinal:  Negative for abdominal distention, abdominal pain and blood in stool.  Endocrine: Negative for hot flashes.  Genitourinary:  Negative for difficulty urinating and frequency.   Musculoskeletal:  Negative for arthralgias.  Skin:  Negative for itching and rash.  Neurological:  Negative for extremity weakness.  Hematological:  Negative for adenopathy.  Psychiatric/Behavioral:  Negative for confusion.    PHYSICAL EXAMINATION:  Vitals:   08/14/24 1537  BP: 130/77  Pulse: 77  Resp: 16  Temp: (!) 96 F (35.6 C)  SpO2: 100%   Filed Weights   08/14/24 1537  Weight: (!) 310 lb (140.6 kg)    Physical Exam Constitutional:      General: She is not in acute distress.    Appearance: She is obese.  HENT:     Head:  Normocephalic and atraumatic.  Eyes:     General: No scleral icterus. Cardiovascular:     Rate and Rhythm: Normal rate and regular rhythm.  Pulmonary:     Effort: Pulmonary effort is normal. No respiratory distress.  Abdominal:     General: There is no distension.  Musculoskeletal:        General: Normal range of motion.     Cervical back: Normal range of motion and neck supple.  Skin:    Findings: No erythema or rash.  Neurological:     Mental Status: She is alert and oriented to person, place, and time. Mental status is at baseline.  Psychiatric:        Mood and Affect: Mood normal.     LABORATORY DATA:  I have reviewed the data as listed    Latest Ref Rng & Units 08/10/2024    3:49 PM 04/06/2024    4:06 PM 03/06/2024    1:15 PM  CBC  WBC 4.0 - 10.5 K/uL 7.3  8.7  8.2   Hemoglobin 12.0 - 15.0 g/dL 89.1  89.1  8.8   Hematocrit 36.0 - 46.0 % 32.8  35.2  28.8   Platelets 150 - 400 K/uL 281  361  361       Latest Ref Rng & Units 01/23/2024    1:52 PM 12/22/2023    6:20 AM 12/21/2023    5:41 AM  CMP  Glucose 70 - 99 mg/dL 895  888  891   BUN 6 - 24 mg/dL 15  13  23    Creatinine 0.57 - 1.00 mg/dL 8.69  9.06  8.87   Sodium 134 - 144 mmol/L 139  139  137   Potassium 3.5 - 5.2 mmol/L 4.5  3.7  3.7   Chloride 96 - 106 mmol/L 98  105  104   CO2 20 - 29 mmol/L 24  25  23    Calcium 8.7 - 10.2 mg/dL 9.1  8.3  8.4   Total Protein 6.0 - 8.5 g/dL 7.9     Total Bilirubin 0.0 - 1.2 mg/dL 0.2     Alkaline Phos 44 - 121 IU/L 125     AST 0 - 40 IU/L 10     ALT 0 - 32 IU/L 5         RADIOGRAPHIC STUDIES: I have personally reviewed the radiological images as listed and agreed with the findings in the report. No results found.

## 2024-08-14 NOTE — Assessment & Plan Note (Signed)
 Encourage oral hydration and avoid nephrotoxins.  No M protein of protein electrophoresis

## 2024-08-14 NOTE — Assessment & Plan Note (Addendum)
 Labs are reviewed and discussed with patient. Lab Results  Component Value Date   HGB 10.8 (L) 08/10/2024   TIBC 316 08/10/2024   IRONPCTSAT 11 08/10/2024   FERRITIN 144 08/10/2024    Hemoglobin and iron  panel have both improved.  Persistently decreased iron  saturation.   Recommend additional IV Venofer  weekly x 1 to further increase iron  stores. No need for Retacrit.

## 2024-08-14 NOTE — Progress Notes (Signed)
 Pt reports having hysterectomy on 06/21/24. Surgery done by Dr Elby, [place].

## 2024-08-14 NOTE — Telephone Encounter (Unsigned)
 Copied from CRM [id number]. Topic: Clinical - Medication Refill >> Aug 14, 2024 10:31 AM Olam RAMAN wrote: Medication: oxyCODONE -acetaminophen  (PERCOCET) 7.5-325 MG tablet PT NEEDS TO GO BACK TO 15 DAY SUPPLY  Has the patient contacted their pharmacy? No (Agent: If no, request that the patient contact the pharmacy for the refill. If patient does not wish to contact the pharmacy document the reason why and proceed with request.) (Agent: If yes, when and what did the pharmacy advise?)  This is the patient's preferred pharmacy:  [facility]ORE #[id number] [place], [place] - [address] AT [facility] OF [address] [place] [address] Phone: [phone] Fax: [phone]  Is this the correct pharmacy for this prescription? Yes If no, delete pharmacy and type the correct one.   Has the prescription been filled recently? Yes  Is the patient out of the medication? Yes  Has the patient been seen for an appointment in the last year OR does the patient have an upcoming appointment? No  Can we respond through MyChart? Yes  Agent: Please be advised that Rx refills may take up to 3 business days. We ask that you follow-up with your pharmacy.

## 2024-08-17 ENCOUNTER — Encounter: Payer: Self-pay | Admitting: Family Medicine

## 2024-08-17 ENCOUNTER — Other Ambulatory Visit: Payer: Self-pay

## 2024-08-17 DIAGNOSIS — R1011 Right upper quadrant pain: Secondary | ICD-10-CM

## 2024-08-19 MED ORDER — OXYCODONE-ACETAMINOPHEN 7.5-325 MG PO TABS: 2.0000 | ORAL_TABLET | Freq: Four times a day (QID) | ORAL | 0 refills | Status: DC | PRN

## 2024-08-20 NOTE — Telephone Encounter (Signed)
 She is overdue for follow up office visit for diabetes and medication management.

## 2024-08-24 ENCOUNTER — Ambulatory Visit (INDEPENDENT_AMBULATORY_CARE_PROVIDER_SITE_OTHER): Admitting: Family Medicine

## 2024-08-24 DIAGNOSIS — I10 Essential (primary) hypertension: Secondary | ICD-10-CM | POA: Diagnosis not present

## 2024-08-24 DIAGNOSIS — G4733 Obstructive sleep apnea (adult) (pediatric): Secondary | ICD-10-CM

## 2024-08-24 DIAGNOSIS — G8929 Other chronic pain: Secondary | ICD-10-CM

## 2024-08-24 DIAGNOSIS — Z1211 Encounter for screening for malignant neoplasm of colon: Secondary | ICD-10-CM

## 2024-08-24 DIAGNOSIS — M25562 Pain in left knee: Secondary | ICD-10-CM

## 2024-08-24 DIAGNOSIS — F1190 Opioid use, unspecified, uncomplicated: Secondary | ICD-10-CM

## 2024-08-24 DIAGNOSIS — R1011 Right upper quadrant pain: Secondary | ICD-10-CM

## 2024-08-24 DIAGNOSIS — M25561 Pain in right knee: Secondary | ICD-10-CM

## 2024-08-24 DIAGNOSIS — N1831 Chronic kidney disease, stage 3a: Secondary | ICD-10-CM | POA: Diagnosis not present

## 2024-08-24 DIAGNOSIS — E1169 Type 2 diabetes mellitus with other specified complication: Secondary | ICD-10-CM | POA: Diagnosis not present

## 2024-08-24 DIAGNOSIS — Z1231 Encounter for screening mammogram for malignant neoplasm of breast: Secondary | ICD-10-CM

## 2024-08-24 DIAGNOSIS — Z23 Encounter for immunization: Secondary | ICD-10-CM

## 2024-08-24 LAB — POCT GLYCOSYLATED HEMOGLOBIN (HGB A1C): Hemoglobin A1C: 5.3 % (ref 4.0–5.6)

## 2024-08-24 MED ORDER — TIRZEPATIDE 12.5 MG/0.5ML ~~LOC~~ SOAJ: 12.5000 mg | SUBCUTANEOUS | 2 refills | Status: DC

## 2024-08-24 MED ORDER — OXYCODONE-ACETAMINOPHEN 7.5-325 MG PO TABS: 2.0000 | ORAL_TABLET | Freq: Four times a day (QID) | ORAL | 0 refills | Status: DC | PRN

## 2024-08-24 NOTE — Patient Instructions (Signed)
 SABRA  Please review the attached list of medications and notify my office if there are any errors.   . Please bring all of your medications to every appointment so we can make sure that our medication list is the same as yours.

## 2024-08-24 NOTE — Progress Notes (Signed)
 Established patient visit   Patient: Jessica Fields   DOB: 1980-11-05   43 y.o. Female  MRN: [id number] Visit Date: 08/24/2024  Today's healthcare provider: Nancyann Perry, MD   Chief Complaint  Patient presents with   Medical Management of Chronic Issues    T2DM and pain management Eye Exam not up to date.   Subjective    HPI  Jessica Fields is a 43 year old female with type 2 diabetes who presents for a follow-up on her medications.  She is currently on Mounjaro , which she feels been effective for weight loss and appetite suppression. She occasionally checks her blood sugar at home and has not noticed any issues. Her A1c is currently 5.3. She experiences mild discomfort if there is a delay in refilling her prescription, feeling 'a little funky' for a day or two without it. She previously used Ozempic  but experienced flu-like symptoms, which she does not have with Mounjaro .  She is taking metformin  three times a day. She is also taking oxycodone  for pain management and Zoloft  at 50 mg, which she feels is effective. She does not regularly check her blood pressure at home but feels it is monitored frequently during her medical visits.  She underwent a total hysterectomy on September 4th and has experienced numbness in her left leg since waking up from surgery, though she does not report tingling or neuropathy-like symptoms.  Wt Readings from Last 5 Encounters:  08/24/24 (!) 309 lb 3.2 oz (140.3 kg)  08/14/24 (!) 310 lb (140.6 kg)  04/12/24 (!) 315 lb (142.9 kg)  04/10/24 (!) 320 lb 6.4 oz (145.3 kg)  03/14/24 (!) 325 lb (147.4 kg)   Lab Results  Component Value Date   NA 139 01/23/2024   K 4.5 01/23/2024   CREATININE 1.30 (H) 01/23/2024   EGFR 52 (L) 01/23/2024   GLUCOSE 104 (H) 01/23/2024   Lab Results  Component Value Date   HGBA1C 5.3 08/24/2024   HGBA1C 6.3 (H) 12/18/2023   HGBA1C 6.3 (A) 06/24/2023   Lab Results  Component Value Date   CHOL 168 06/24/2023    HDL 36 (L) 06/24/2023   LDLCALC 104 (H) 06/24/2023   TRIG 160 (H) 06/24/2023   CHOLHDL 4.7 (H) 06/24/2023     Medications: Outpatient Medications Prior to Visit  Medication Sig   amLODipine  (NORVASC ) 10 MG tablet TAKE 1 TABLET(10 MG) BY MOUTH DAILY   celecoxib  (CELEBREX ) 200 MG capsule TAKE 1 CAPSULE BY MOUTH TWICE DAILY   diclofenac  sodium (VOLTAREN ) 1 % GEL Apply 4 g topically 4 (four) times daily as needed.   estradiol (VIVELLE-DOT) 0.05 MG/24HR patch 1 patch 2 (two) times a week.   fluticasone (FLONASE) 50 MCG/ACT nasal spray Place 2 sprays into both nostrils daily as needed.   lisinopril -hydrochlorothiazide  (ZESTORETIC ) 10-12.5 MG tablet TAKE 1 TABLET BY MOUTH DAILY   Melatonin 10 MG TABS Take 10 mg by mouth at bedtime as needed.   metFORMIN  (GLUCOPHAGE ) 500 MG tablet TAKE 1 TABLET(500 MG) BY MOUTH THREE TIMES DAILY WITH MEALS   MOUNJARO  10 MG/0.5ML Pen ADMINISTER 10 MG UNDER THE SKIN 1 TIME A WEEK   omeprazole  (PRILOSEC) 20 MG capsule TAKE 1 CAPSULE(20 MG) BY MOUTH DAILY   sertraline  (ZOLOFT ) 50 MG tablet TAKE 1 TABLET(50 MG) BY MOUTH DAILY   solifenacin  (VESICARE ) 10 MG tablet TAKE 1 TABLET(10 MG) BY MOUTH DAILY   oxyCODONE -acetaminophen  (PERCOCET) 7.5-325 MG tablet Take 2 tablets by mouth every 6 (six) hours  as needed for severe pain (pain score 7-10).   No facility-administered medications prior to visit.    Review of Systems  Constitutional:  Negative for appetite change, chills, fatigue and fever.  Respiratory:  Negative for chest tightness and shortness of breath.   Cardiovascular:  Negative for chest pain and palpitations.  Gastrointestinal:  Negative for abdominal pain, nausea and vomiting.  Neurological:  Negative for dizziness and weakness.       Objective    BP 122/66 (BP Location: Left Arm, Patient Position: Sitting)   Pulse 85   Resp 16   Ht 5' 6 (1.676 m)   Wt (!) 309 lb 3.2 oz (140.3 kg)   SpO2 99%   BMI 49.91 kg/m    Physical Exam   General:  Appearance:    Obese female in no acute distress  Eyes:    PERRL, conjunctiva/corneas clear, EOM's intact       Lungs:     Clear to auscultation bilaterally, respirations unlabored  Heart:    Normal heart rate. Normal rhythm. No murmurs, rubs, or gallops.    MS:   All extremities are intact.    Neurologic:   Awake, alert, oriented x 3. No apparent focal neurological defect.         Results for orders placed or performed in visit on 08/24/24  POCT glycosylated hemoglobin (Hb A1C)  Result Value Ref Range   Hemoglobin A1C 5.3 4.0 - 5.6 %    Assessment & Plan     1. Type 2 diabetes mellitus with morbid obesity (HCC) (Primary) Well controlled, Mounjaro  more effective than Ozempic . Weight loss is stalling. Will titrate Mounjaro  up 12.5 for 12 weeks with next refill and patient is to let me know if tolerating and can titrate to 15 after that.  - Urine microalbumin-creatinine with uACR - CBC - Comprehensive metabolic panel with GFR - Lipid panel - TSH  2. Primary hypertension Well controlled.  Continue current medications.   3. Obstructive sleep apnea Expect this to improve with weight loss on higher dose of Mounjaro .   4. Stage 3a chronic kidney disease (HCC) Stable on current regiment. Consider d/c hydrochlorothiazide  if BP drops below 120 at follow up appointment.   5. Chronic, continuous use of opioids No sign of abuse or diversion on current opioid regiment.  - Pain Mgt Scrn (14 Drugs), Ur  6. Chronic pain of both knees  - Pain Mgt Scrn (14 Drugs), Ur  7. RUQ pain Chronic choledocholithiasis. May be candidate for surgery if she loses more weight.  - oxyCODONE -acetaminophen  (PERCOCET) 7.5-325 MG tablet; Take 2 tablets by mouth every 6 (six) hours as needed for severe pain (pain score 7-10).  Dispense: 120 tablet; Refill: 0  8. Encounter for screening mammogram for malignant neoplasm of breast Given contact number for [place].   9. Colon cancer screening  -  Cologuard  10. Influenza vaccine needed  - Flu vaccine trivalent PF, 6mos and older(Flulaval,Afluria,Fluarix,Fluzone)   Recommended Prevnar-20 which she declined today.   11. Intermittent left leg numbness post-surgery Persistent numbness post-hysterectomy, likely surgical-related. - Continue to monitor symptoms.   Return in about 6 months (around 02/21/2025) for Hypertension, Diabetes.         Nancyann Perry, MD  [facility] [phone] (phone) [phone] (fax)  [facility]

## 2024-08-25 LAB — PAIN MGT SCRN (14 DRUGS), UR
Amphetamine Scrn, Ur: NEGATIVE ng/mL
BARBITURATE SCREEN URINE: NEGATIVE ng/mL
BENZODIAZEPINE SCREEN, URINE: NEGATIVE ng/mL
Buprenorphine, Urine: NEGATIVE ng/mL
CANNABINOIDS UR QL SCN: NEGATIVE ng/mL
Cocaine (Metab) Scrn, Ur: NEGATIVE ng/mL
Creatinine(Crt), U: 123.1 mg/dL (ref 20.0–300.0)
Fentanyl, Urine: NEGATIVE pg/mL
Meperidine Screen, Urine: NEGATIVE ng/mL
Methadone Screen, Urine: NEGATIVE ng/mL
OXYCODONE+OXYMORPHONE UR QL SCN: POSITIVE ng/mL — AB
Opiate Scrn, Ur: POSITIVE ng/mL — AB
Ph of Urine: 5.1 (ref 4.5–8.9)
Phencyclidine Qn, Ur: NEGATIVE ng/mL
Propoxyphene Scrn, Ur: NEGATIVE ng/mL
Tramadol Screen, Urine: NEGATIVE ng/mL

## 2024-08-25 LAB — SPECIMEN STATUS REPORT

## 2024-08-26 ENCOUNTER — Ambulatory Visit: Payer: Self-pay | Admitting: Family Medicine

## 2024-09-03 ENCOUNTER — Other Ambulatory Visit: Payer: Self-pay | Admitting: Family Medicine

## 2024-09-03 DIAGNOSIS — M7661 Achilles tendinitis, right leg: Secondary | ICD-10-CM

## 2024-09-12 ENCOUNTER — Other Ambulatory Visit: Payer: Self-pay | Admitting: Family Medicine

## 2024-09-12 DIAGNOSIS — R1011 Right upper quadrant pain: Secondary | ICD-10-CM

## 2024-09-12 NOTE — Telephone Encounter (Signed)
 Copied from CRM #[id number]. Topic: Clinical - Medication Refill >> Sep 12, 2024  4:04 PM Tobias L wrote: Medication: oxyCODONE -acetaminophen  (PERCOCET) 7.5-325 MG tablet  Has the patient contacted their pharmacy? No   This is the patient's preferred pharmacy:  [id number] [place], [place] - [address] AT [facility] OF [address] [place] [address] Phone: [phone] Fax: [phone]  Is this the correct pharmacy for this prescription? Yes  Has the prescription been filled recently? No  Is the patient out of the medication? Yes  Has the patient been seen for an appointment in the last year OR does the patient have an upcoming appointment? Yes  Can we respond through MyChart? Yes  Agent: Please be advised that Rx refills may take up to 3 business days. We ask that you follow-up with your pharmacy.

## 2024-09-18 ENCOUNTER — Telehealth: Payer: Self-pay

## 2024-09-18 ENCOUNTER — Other Ambulatory Visit: Payer: Self-pay

## 2024-09-18 DIAGNOSIS — R1011 Right upper quadrant pain: Secondary | ICD-10-CM

## 2024-09-18 MED ORDER — OXYCODONE-ACETAMINOPHEN 7.5-325 MG PO TABS: 2.0000 | ORAL_TABLET | Freq: Four times a day (QID) | ORAL | 0 refills | Status: DC | PRN

## 2024-09-18 NOTE — Telephone Encounter (Signed)
 Requested medication (s) are due for refill today: yes  Requested medication (s) are on the active medication list: yes  Last refill:  08/24/24  Future visit scheduled: yes  Notes to clinic:  Unable to refill per protocol, cannot delegate.      Requested Prescriptions  Pending Prescriptions Disp Refills   oxyCODONE -acetaminophen  (PERCOCET) 7.5-325 MG tablet 120 tablet 0    Sig: Take 2 tablets by mouth every 6 (six) hours as needed for severe pain (pain score 7-10).     Not Delegated - Analgesics:  Opioid Agonist Combinations Failed - 09/18/2024 11:00 AM      Failed - This refill cannot be delegated      Failed - Urine Drug Screen completed in last 360 days      Passed - Valid encounter within last 3 months    Recent Outpatient Visits           3 weeks ago Type 2 diabetes mellitus with morbid obesity (HCC)   [place] [facility] [place] Nancyann BRAVO, MD   7 months ago Incomplete emptying of bladder   [facility] Nancyann BRAVO, MD   8 months ago Hx of urinary infection   [facility] Nancyann BRAVO, MD   9 months ago Vaginal bleeding   [facility] Nancyann BRAVO, MD

## 2024-09-18 NOTE — Telephone Encounter (Signed)
 LOV- 08/24/2024 NOV- 02/22/2025 LRF- 08/24/2024 Outpatient Medication Detail   Disp Refills Start End   oxyCODONE -acetaminophen  (PERCOCET) 7.5-325 MG tablet 120 tablet 0 08/24/2024 --   Sig - Route: Take 2 tablets by mouth every 6 (six) hours as needed for severe pain (pain score 7-10). - Oral   Sent to pharmacy as: oxyCODONE -acetaminophen  (PERCOCET) 7.5-325 MG tablet   Earliest Fill Date: 08/24/2024   E-Prescribing Status: Receipt confirmed by pharmacy (08/24/2024  3:37 PM EST)

## 2024-09-18 NOTE — Telephone Encounter (Signed)
 Converted into a refill request

## 2024-09-18 NOTE — Telephone Encounter (Signed)
 Copied from CRM #[id number]. Topic: Clinical - Medication Refill >> Sep 12, 2024  4:04 PM Tobias L wrote: Medication: oxyCODONE -acetaminophen  (PERCOCET) 7.5-325 MG tablet  Has the patient contacted their pharmacy? No   This is the patient's preferred pharmacy:  [facility] #[id number] [place], [place] - [address] AT [facility] OF [address] [place] [address] Phone: [phone] Fax: [phone]  Is this the correct pharmacy for this prescription? Yes  Has the prescription been filled recently? No  Is the patient out of the medication? Yes  Has the patient been seen for an appointment in the last year OR does the patient have an upcoming appointment? Yes  Can we respond through MyChart? Yes  Agent: Please be advised that Rx refills may take up to 3 business days. We ask that you follow-up with your pharmacy. >> Sep 18, 2024  1:18 PM Montie POUR wrote: Jessica Fields is calling to check on above medication refill. Her pharmacy has not received refill yet. She has been out of medication for 1 week. Please call her at [phone] to discuss.

## 2024-09-28 ENCOUNTER — Other Ambulatory Visit: Payer: Self-pay

## 2024-09-28 DIAGNOSIS — R1900 Intra-abdominal and pelvic swelling, mass and lump, unspecified site: Secondary | ICD-10-CM

## 2024-09-29 LAB — COLOGUARD: COLOGUARD: NEGATIVE

## 2024-10-05 ENCOUNTER — Telehealth: Payer: Self-pay

## 2024-10-05 NOTE — Telephone Encounter (Signed)
 Voicemail left with Ms. Neeb for her 6 month follow up with Dr. Elby (New to [facility]). MRI order placed.

## 2024-10-08 ENCOUNTER — Telehealth: Payer: Self-pay | Admitting: Family Medicine

## 2024-10-08 DIAGNOSIS — R1011 Right upper quadrant pain: Secondary | ICD-10-CM

## 2024-10-08 NOTE — Telephone Encounter (Unsigned)
 Copied from CRM [id number]. Topic: Clinical - Medication Refill >> Oct 08, 2024  3:19 PM Sophia H wrote: Medication: oxyCODONE -acetaminophen  (PERCOCET) 7.5-325 MG tablet   Has the patient contacted their pharmacy? Yes, has to call every time for fill.   This is the patient's preferred pharmacy:  [facility] #[id number] [place], [place] - [address] AT [facility] OF [address] [place] [address] Phone: [phone] Fax: [phone]  Is this the correct pharmacy for this prescription? Yes If no, delete pharmacy and type the correct one.   Has the prescription been filled recently? Yes  Is the patient out of the medication? Yes  Has the patient been seen for an appointment in the last year OR does the patient have an upcoming appointment? Yes, seen back in November   Can we respond through MyChart? Yes  Agent: Please be advised that Rx refills may take up to 3 business days. We ask that you follow-up with your pharmacy.

## 2024-10-09 ENCOUNTER — Encounter: Payer: Self-pay | Admitting: Family Medicine

## 2024-10-09 DIAGNOSIS — R1011 Right upper quadrant pain: Secondary | ICD-10-CM

## 2024-10-09 MED ORDER — OXYCODONE-ACETAMINOPHEN 7.5-325 MG PO TABS: 2.0000 | ORAL_TABLET | Freq: Four times a day (QID) | ORAL | 0 refills | Status: DC | PRN

## 2024-10-10 ENCOUNTER — Other Ambulatory Visit (HOSPITAL_COMMUNITY): Payer: Self-pay

## 2024-10-10 ENCOUNTER — Telehealth: Payer: Self-pay | Admitting: Pharmacy Technician

## 2024-10-10 ENCOUNTER — Encounter: Payer: Self-pay | Admitting: Oncology

## 2024-10-10 NOTE — Telephone Encounter (Signed)
 Pharmacy Patient Advocate Encounter  Received notification from [facility] that Prior Authorization for Mounjaro  12.5MG /0.5ML auto-injectors  has been APPROVED from 10/10/24 to 10/10/25. Ran test claim, Copay is $25.00. This test claim was processed through [facility]- copay amounts may vary at other pharmacies due to pharmacy/plan contracts, or as the patient moves through the different stages of their insurance plan.   PA #/Case ID/Reference #: [id number]

## 2024-10-10 NOTE — Telephone Encounter (Signed)
 Pharmacy Patient Advocate Encounter   Received notification from Onbase that prior authorization for Mounjaro  12.5MG /0.5ML auto-injectors  is required/requested.   Insurance verification completed.   The patient is insured through [facility].   Per test claim: PA required; PA submitted to above mentioned insurance via Latent Key/confirmation #/EOC [id number] Status is pending

## 2024-10-15 ENCOUNTER — Encounter: Payer: Self-pay | Admitting: Oncology

## 2024-10-15 ENCOUNTER — Telehealth: Payer: Self-pay | Admitting: Pharmacy Technician

## 2024-10-15 ENCOUNTER — Other Ambulatory Visit (HOSPITAL_COMMUNITY): Payer: Self-pay

## 2024-10-15 NOTE — Telephone Encounter (Signed)
 Pharmacy Patient Advocate Encounter   Received notification from Onbase that prior authorization for Oxycodone -acetaminophen  7.5-325 mg is required/requested.   Insurance verification completed.   The patient is insured through [facility].   Per test claim: Looks like patient picked up medication on 10/10/24

## 2024-10-28 ENCOUNTER — Encounter: Payer: Self-pay | Admitting: Oncology

## 2024-10-29 ENCOUNTER — Other Ambulatory Visit: Payer: Self-pay | Admitting: Family Medicine

## 2024-10-29 DIAGNOSIS — R1011 Right upper quadrant pain: Secondary | ICD-10-CM

## 2024-10-29 MED ORDER — OXYCODONE-ACETAMINOPHEN 7.5-325 MG PO TABS: 2.0000 | ORAL_TABLET | Freq: Four times a day (QID) | ORAL | 0 refills | Status: DC | PRN

## 2024-10-31 ENCOUNTER — Ambulatory Visit: Admitting: Family Medicine

## 2024-11-16 ENCOUNTER — Encounter: Payer: Self-pay | Admitting: Family Medicine

## 2024-11-16 DIAGNOSIS — R1011 Right upper quadrant pain: Secondary | ICD-10-CM

## 2024-11-16 MED ORDER — OXYCODONE-ACETAMINOPHEN 7.5-325 MG PO TABS: 2.0000 | ORAL_TABLET | Freq: Four times a day (QID) | ORAL | 0 refills | Status: AC | PRN

## 2024-11-16 NOTE — Telephone Encounter (Signed)
 Contract: No UDS: 08/24/24 WNL Last Visit: 08/24/2024 Next Visit: 02/22/2025 Last Refill: 10/29/24 #120  Please Advise

## 2024-11-21 ENCOUNTER — Other Ambulatory Visit (HOSPITAL_COMMUNITY): Payer: Self-pay

## 2024-11-22 ENCOUNTER — Other Ambulatory Visit: Payer: Self-pay | Admitting: Family Medicine

## 2024-11-22 ENCOUNTER — Other Ambulatory Visit (HOSPITAL_COMMUNITY): Payer: Self-pay

## 2024-11-22 ENCOUNTER — Telehealth: Payer: Self-pay

## 2024-11-22 DIAGNOSIS — E1169 Type 2 diabetes mellitus with other specified complication: Secondary | ICD-10-CM

## 2024-11-22 DIAGNOSIS — G4733 Obstructive sleep apnea (adult) (pediatric): Secondary | ICD-10-CM

## 2024-11-22 NOTE — Telephone Encounter (Signed)
 Pharmacy Patient Advocate Encounter   Received notification from Onbase CMM KEY that prior authorization for oxyCODONE -Acetaminophen  7.5-325MG  tablets  is required/requested.   Insurance verification completed.   The patient is insured through [facility].   Per test claim: PA required; PA submitted to above mentioned insurance via Latent Key/confirmation #/EOC [id number] Status is pending

## 2025-02-04 ENCOUNTER — Inpatient Hospital Stay

## 2025-02-07 ENCOUNTER — Inpatient Hospital Stay: Admitting: Oncology

## 2025-02-07 ENCOUNTER — Inpatient Hospital Stay

## 2025-02-22 ENCOUNTER — Ambulatory Visit: Admitting: Family Medicine

## 2025-04-03 ENCOUNTER — Inpatient Hospital Stay
